# Patient Record
Sex: Male | Born: 1955 | Race: Black or African American | Hispanic: No | Marital: Single | Smoking: Former smoker
Health system: Southern US, Community
[De-identification: ages and names within clinical notes are randomized; demographics above are authoritative.]

## PROBLEM LIST (undated history)

## (undated) DIAGNOSIS — E785 Hyperlipidemia, unspecified: Secondary | ICD-10-CM

## (undated) DIAGNOSIS — Z8673 Personal history of transient ischemic attack (TIA), and cerebral infarction without residual deficits: Secondary | ICD-10-CM

## (undated) DIAGNOSIS — Z6825 Body mass index (BMI) 25.0-25.9, adult: Secondary | ICD-10-CM

## (undated) DIAGNOSIS — R251 Tremor, unspecified: Secondary | ICD-10-CM

## (undated) DIAGNOSIS — E559 Vitamin D deficiency, unspecified: Secondary | ICD-10-CM

## (undated) DIAGNOSIS — F1010 Alcohol abuse, uncomplicated: Secondary | ICD-10-CM

## (undated) DIAGNOSIS — I10 Essential (primary) hypertension: Secondary | ICD-10-CM

## (undated) DIAGNOSIS — R011 Cardiac murmur, unspecified: Secondary | ICD-10-CM

## (undated) DIAGNOSIS — N183 Chronic kidney disease, stage 3 unspecified: Secondary | ICD-10-CM

## (undated) DIAGNOSIS — I350 Nonrheumatic aortic (valve) stenosis: Secondary | ICD-10-CM

## (undated) DIAGNOSIS — E663 Overweight: Secondary | ICD-10-CM

## (undated) HISTORY — DX: Nonrheumatic aortic (valve) stenosis: I35.0

## (undated) HISTORY — DX: Vitamin D deficiency, unspecified: E55.9

## (undated) HISTORY — DX: Personal history of transient ischemic attack (TIA), and cerebral infarction without residual deficits: Z86.73

## (undated) HISTORY — DX: Alcohol abuse, uncomplicated: F10.10

## (undated) HISTORY — DX: Essential (primary) hypertension: I10

## (undated) HISTORY — DX: Tremor, unspecified: R25.1

## (undated) HISTORY — DX: Cardiac murmur, unspecified: R01.1

## (undated) HISTORY — DX: Chronic kidney disease, stage 3 unspecified: N18.30

## (undated) HISTORY — DX: Hyperlipidemia, unspecified: E78.5

## (undated) HISTORY — DX: Overweight: E66.3

## (undated) HISTORY — DX: Body mass index (BMI) 25.0-25.9, adult: Z68.25

---

## 2009-06-29 ENCOUNTER — Emergency Department (HOSPITAL_COMMUNITY): Admission: EM | Admit: 2009-06-29 | Discharge: 2009-06-29 | Payer: Self-pay | Admitting: Emergency Medicine

## 2009-07-02 ENCOUNTER — Emergency Department (HOSPITAL_COMMUNITY): Admission: EM | Admit: 2009-07-02 | Discharge: 2009-07-02 | Payer: Self-pay | Admitting: Family Medicine

## 2009-07-11 ENCOUNTER — Encounter: Admission: RE | Admit: 2009-07-11 | Discharge: 2009-07-11 | Payer: Self-pay | Admitting: Chiropractic Medicine

## 2017-01-16 ENCOUNTER — Emergency Department (HOSPITAL_COMMUNITY): Payer: Self-pay

## 2017-01-16 ENCOUNTER — Emergency Department (HOSPITAL_COMMUNITY)
Admission: EM | Admit: 2017-01-16 | Discharge: 2017-01-16 | Disposition: A | Discharge status: Home / Self Care | Payer: Self-pay | Attending: Emergency Medicine | Admitting: Emergency Medicine

## 2017-01-16 ENCOUNTER — Encounter (HOSPITAL_COMMUNITY): Payer: Self-pay | Admitting: Emergency Medicine

## 2017-01-16 DIAGNOSIS — M79641 Pain in right hand: Secondary | ICD-10-CM | POA: Insufficient documentation

## 2017-01-16 DIAGNOSIS — Y999 Unspecified external cause status: Secondary | ICD-10-CM | POA: Insufficient documentation

## 2017-01-16 DIAGNOSIS — F10920 Alcohol use, unspecified with intoxication, uncomplicated: Secondary | ICD-10-CM | POA: Insufficient documentation

## 2017-01-16 DIAGNOSIS — Y939 Activity, unspecified: Secondary | ICD-10-CM | POA: Insufficient documentation

## 2017-01-16 DIAGNOSIS — W19XXXA Unspecified fall, initial encounter: Secondary | ICD-10-CM

## 2017-01-16 DIAGNOSIS — W010XXA Fall on same level from slipping, tripping and stumbling without subsequent striking against object, initial encounter: Secondary | ICD-10-CM | POA: Insufficient documentation

## 2017-01-16 DIAGNOSIS — S60812A Abrasion of left wrist, initial encounter: Secondary | ICD-10-CM | POA: Insufficient documentation

## 2017-01-16 DIAGNOSIS — S80211A Abrasion, right knee, initial encounter: Secondary | ICD-10-CM | POA: Insufficient documentation

## 2017-01-16 DIAGNOSIS — Y929 Unspecified place or not applicable: Secondary | ICD-10-CM | POA: Insufficient documentation

## 2017-01-16 DIAGNOSIS — S060X0A Concussion without loss of consciousness, initial encounter: Secondary | ICD-10-CM | POA: Insufficient documentation

## 2017-01-16 HISTORY — DX: Alcohol abuse, uncomplicated: F10.10

## 2017-01-16 LAB — CBC WITH DIFFERENTIAL/PLATELET
BASOS ABS: 0 10*3/uL (ref 0.0–0.1)
BASOS PCT: 0 %
EOS ABS: 0 10*3/uL (ref 0.0–0.7)
EOS PCT: 0 %
HCT: 42.9 % (ref 39.0–52.0)
Hemoglobin: 15.1 g/dL (ref 13.0–17.0)
LYMPHS PCT: 45 %
Lymphs Abs: 3 10*3/uL (ref 0.7–4.0)
MCH: 33 pg (ref 26.0–34.0)
MCHC: 35.2 g/dL (ref 30.0–36.0)
MCV: 93.9 fL (ref 78.0–100.0)
Monocytes Absolute: 0.4 10*3/uL (ref 0.1–1.0)
Monocytes Relative: 6 %
Neutro Abs: 3.3 10*3/uL (ref 1.7–7.7)
Neutrophils Relative %: 49 %
PLATELETS: 158 10*3/uL (ref 150–400)
RBC: 4.57 MIL/uL (ref 4.22–5.81)
RDW: 13.3 % (ref 11.5–15.5)
WBC: 6.8 10*3/uL (ref 4.0–10.5)

## 2017-01-16 LAB — BASIC METABOLIC PANEL
Anion gap: 11 (ref 5–15)
BUN: 19 mg/dL (ref 6–20)
CHLORIDE: 103 mmol/L (ref 101–111)
CO2: 22 mmol/L (ref 22–32)
CREATININE: 1.62 mg/dL — AB (ref 0.61–1.24)
Calcium: 9.4 mg/dL (ref 8.9–10.3)
GFR, EST AFRICAN AMERICAN: 52 mL/min — AB (ref >60)
GFR, EST NON AFRICAN AMERICAN: 45 mL/min — AB (ref >60)
Glucose, Bld: 91 mg/dL (ref 65–99)
Potassium: 3.6 mmol/L (ref 3.5–5.1)
SODIUM: 136 mmol/L (ref 135–145)

## 2017-01-16 LAB — ETHANOL: ALCOHOL ETHYL (B): 381 mg/dL — AB (ref <5)

## 2017-01-16 MED ORDER — SODIUM CHLORIDE 0.9 % IV BOLUS (SEPSIS)
1000.0000 mL | Freq: Once | INTRAVENOUS | Status: AC
  Administered 2017-01-16: 1000 mL via INTRAVENOUS

## 2017-01-16 MED ORDER — BACITRACIN ZINC 500 UNIT/GM EX OINT: 1.0000 "application " | TOPICAL_OINTMENT | Freq: Two times a day (BID) | CUTANEOUS | 1 refills | Status: DC

## 2017-01-16 NOTE — ED Provider Notes (Signed)
[facility]-EMERGENCY DEPT Provider Note   CSN: [id number] Arrival date & time: 01/16/17  1732     History   Chief Complaint Chief Complaint  Patient presents with  . Alcohol Intoxication    HPI Steve Kelly is a 61 y.o. male.   Level V caveat: Alcohol intoxication.   Steve Kelly is a 61 y.o. Male with a history of alcoholism who presents to the ED via EMS after a fall while drinking today. Patient admits to drinking lots of alcohol today. He tells me he likes "the cheap stuff."  He can provide no details about his fall today. EMS he fell onto a table while drinking today. Initially, patient denies any complaints. During exam he does report he has pain to his right hand, and his right knee. His is unsure if he lost consciousness. He denies head or neck pain. No reported LOC per bystanders.  He denies chest pain or trouble breathing.    The history is provided by the patient, the EMS personnel and medical records. No language interpreter was used.  Alcohol Intoxication  Pertinent negatives include no chest pain, no abdominal pain, no headaches and no shortness of breath.    Past Medical History:  Diagnosis Date  . Alcohol abuse     There are no active problems to display for this patient.   History reviewed. No pertinent surgical history.     Home Medications    Prior to Admission medications   Medication Sig Start Date End Date Taking? Authorizing Provider  acetaminophen (TYLENOL) 325 MG tablet Take 650 mg by mouth every 6 (six) hours as needed for mild pain.   Yes [provider]  naproxen sodium (ANAPROX) 220 MG tablet Take 220 mg by mouth 2 (two) times daily as needed (for mild pain).    Yes [provider]  bacitracin ointment Apply 1 application topically 2 (two) times daily. 01/16/17   Everlene Farrieransie, Rohith Fauth, PA-C    Family History History reviewed. No pertinent family history.  Social History Social History  Substance Use Topics  . Smoking  status: Never Smoker  . Smokeless tobacco: Never Used  . Alcohol use Yes     Allergies   Patient has no known allergies.   Review of Systems Review of Systems  Unable to perform ROS: Mental status change (alcohol intoxication )  Constitutional: Negative for fever.  HENT: Negative for nosebleeds.   Eyes: Negative for visual disturbance.  Respiratory: Negative for cough and shortness of breath.   Cardiovascular: Negative for chest pain.  Gastrointestinal: Negative for abdominal pain, nausea and vomiting.  Musculoskeletal: Positive for arthralgias. Negative for back pain and neck pain.  Skin: Positive for wound. Negative for rash.  Neurological: Negative for headaches.     Physical Exam Updated Vital Signs BP 113/61   Pulse 89   Temp 98.4 F (36.9 C) (Oral)   Resp 14   SpO2 92%   Physical Exam  Constitutional: He appears well-developed and well-nourished. No distress.  HENT:  Head: Normocephalic.  Right Ear: External ear normal.  Left Ear: External ear normal.  Mouth/Throat: Oropharynx is clear and moist.  Abrasion to right forehead. No other visible or palpated signs of head injury or trauma. Patient pulled out dentures and threw them on the floor.   Eyes: Pupils are equal, round, and reactive to light. Conjunctivae and EOM are normal. Right eye exhibits no discharge. Left eye exhibits no discharge.  Neck: Neck supple. No tracheal deviation present.  Wearing  c-collar.   Cardiovascular: Normal rate, regular rhythm, normal heart sounds and intact distal pulses.  Exam reveals no gallop and no friction rub.   No murmur heard. Bilateral radial, posterior tibialis and dorsalis pedis pulses are intact.    Pulmonary/Chest: Effort normal and breath sounds normal. No respiratory distress. He has no wheezes. He has no rales. He exhibits no tenderness.  Lungs are clear to ascultation bilaterally. Symmetric chest expansion bilaterally. No increased work of breathing. No rales or  rhonchi.    Abdominal: Soft. There is no tenderness. There is no guarding.  Musculoskeletal: Normal range of motion. He exhibits tenderness. He exhibits no edema or deformity.  Abrasion to left wrist. Pain with ROM of right hand without deformity. Superficial abrasion to right knee. Good ROM of bilateral knees without deformity. No midline back tenderness to palpation. No clavicle tenderness bilateral. Patient's bilateral shoulder, elbow, hip and ankle joints are supple and non-tender to palpation.  Neurological: He is alert. No cranial nerve deficit or sensory deficit. Coordination normal.  Oriented to person and place. Amnesia to the event. Strength and sensation is intact to his bilateral upper and lower extremities.  Skin: Skin is warm and dry. Capillary refill takes less than 2 seconds. He is not diaphoretic. No erythema. No pallor.  Psychiatric: He has a normal mood and affect. His behavior is normal.  Nursing note and vitals reviewed.    ED Treatments / Results  Labs (all labs ordered are listed, but only abnormal results are displayed) Labs Reviewed  BASIC METABOLIC PANEL - Abnormal; Notable for the following:       Result Value   Creatinine, Ser 1.62 (*)    GFR calc non Af Amer 45 (*)    GFR calc Af Amer 52 (*)    All other components within normal limits  ETHANOL - Abnormal; Notable for the following:    Alcohol, Ethyl (B) 381 (*)    All other components within normal limits  CBC WITH DIFFERENTIAL/PLATELET    EKG  EKG Interpretation None       Radiology Dg Wrist Complete Left  Result Date: 01/16/2017 CLINICAL DATA:  Pain and abrasions posterior LEFT wrist, fall EXAM: LEFT WRIST - COMPLETE 3+ VIEW COMPARISON:  None FINDINGS: Degenerative changes at first [facility] joint. Remain joint spaces preserved. Osseous mineralization grossly normal. No acute fracture, dislocation, or bone destruction. IMPRESSION: No acute osseous abnormalities. Degenerative changes at first [facility] joint.  Electronically Signed   By: Ulyses Southward M.D.   On: 01/16/2017 19:33   Ct Head Wo Contrast  Result Date: 01/16/2017 CLINICAL DATA:  Patient status post fall.  Forehead contusion. EXAM: CT HEAD WITHOUT CONTRAST CT CERVICAL SPINE WITHOUT CONTRAST TECHNIQUE: Multidetector CT imaging of the head and cervical spine was performed following the standard protocol without intravenous contrast. Multiplanar CT image reconstructions of the cervical spine were also generated. COMPARISON:  Cervical spine radiograph 07/11/2009 FINDINGS: CT HEAD FINDINGS Brain: Ventricles and sulci are prominent compatible with atrophy. Periventricular and subcortical white matter hypodensity compatible with chronic microvascular ischemic changes. No evidence for acute cortically based infarct, intracranial hemorrhage, mass lesion or mass-effect. Vascular: Unremarkable. Skull: Intact. Sinuses/Orbits: Paranasal sinuses are well aerated. Mastoid air cells are unremarkable. Orbits are unremarkable. Other: Soft tissue swelling overlying the left frontal calvarium. CT CERVICAL SPINE FINDINGS Alignment: Straightening of the normal cervical lordosis. Skull base and vertebrae: Intact. Soft tissues and spinal canal: No prevertebral fluid or swelling. No visible canal hematoma. Disc levels: Multilevel degenerative disc disease  throughout the entire cervical spine with anterior and posterior osteophytosis. Mild facet degenerative changes. Upper chest: Lung apices are clear. Other: None. IMPRESSION: No acute intracranial process. No acute cervical spine fracture. Atrophy and chronic microvascular ischemic changes. Multilevel degenerative disc disease. Electronically Signed   By: Annia Belt M.D.   On: 01/16/2017 19:46   Ct Cervical Spine Wo Contrast  Result Date: 01/16/2017 CLINICAL DATA:  Patient status post fall.  Forehead contusion. EXAM: CT HEAD WITHOUT CONTRAST CT CERVICAL SPINE WITHOUT CONTRAST TECHNIQUE: Multidetector CT imaging of the head and  cervical spine was performed following the standard protocol without intravenous contrast. Multiplanar CT image reconstructions of the cervical spine were also generated. COMPARISON:  Cervical spine radiograph 07/11/2009 FINDINGS: CT HEAD FINDINGS Brain: Ventricles and sulci are prominent compatible with atrophy. Periventricular and subcortical white matter hypodensity compatible with chronic microvascular ischemic changes. No evidence for acute cortically based infarct, intracranial hemorrhage, mass lesion or mass-effect. Vascular: Unremarkable. Skull: Intact. Sinuses/Orbits: Paranasal sinuses are well aerated. Mastoid air cells are unremarkable. Orbits are unremarkable. Other: Soft tissue swelling overlying the left frontal calvarium. CT CERVICAL SPINE FINDINGS Alignment: Straightening of the normal cervical lordosis. Skull base and vertebrae: Intact. Soft tissues and spinal canal: No prevertebral fluid or swelling. No visible canal hematoma. Disc levels: Multilevel degenerative disc disease throughout the entire cervical spine with anterior and posterior osteophytosis. Mild facet degenerative changes. Upper chest: Lung apices are clear. Other: None. IMPRESSION: No acute intracranial process. No acute cervical spine fracture. Atrophy and chronic microvascular ischemic changes. Multilevel degenerative disc disease. Electronically Signed   By: Annia Belt M.D.   On: 01/16/2017 19:46   Dg Knee Complete 4 Views Right  Result Date: 01/16/2017 CLINICAL DATA:  Pain and abrasions RIGHT knee, fall EXAM: RIGHT KNEE - COMPLETE 4+ VIEW COMPARISON:  None FINDINGS: Mild medial compartment joint space narrowing. Osseous mineralization normal. No acute fracture, dislocation, or bone destruction. No knee joint effusion. IMPRESSION: No acute osseous abnormalities. Mild degenerative changes RIGHT knee. Electronically Signed   By: Ulyses Southward M.D.   On: 01/16/2017 19:32   Dg Hand Complete Right  Result Date: 01/16/2017 CLINICAL  DATA:  Posterior RIGHT hand pain, fall EXAM: RIGHT HAND - COMPLETE 3+ VIEW COMPARISON:  None FINDINGS: Osseous mineralization normal. Joint spaces preserved. Minimal degenerative changes at first [facility] joint. Motion artifacts degrade lateral view. No acute fracture, dislocation, or bone destruction. IMPRESSION: No definite acute bony abnormalities. Electronically Signed   By: Ulyses Southward M.D.   On: 01/16/2017 19:31    Procedures Procedures (including critical care time)  Medications Ordered in ED Medications  sodium chloride 0.9 % bolus 1,000 mL (0 mLs Intravenous Stopped 01/16/17 2104)     Initial Impression / Assessment and Plan / ED Course  I have reviewed the triage vital signs and the nursing notes.  Pertinent labs & imaging results that were available during my care of the patient were reviewed by me and considered in my medical decision making (see chart for details).    This is a 61 y.o. Male with a history of alcoholism who presents to the ED via EMS after a fall while drinking today. Patient admits to drinking lots of alcohol today. He tells me he likes "the cheap stuff."  He can provide no details about his fall today. EMS he fell onto a table while drinking today. Initially, patient denies any complaints. During exam he does report he has pain to his right hand, and his right knee. His  is unsure if he lost consciousness. He denies head or neck pain. No reported LOC per bystanders.  Exam patient is afebrile nontoxic appearing. He smells of alcohol.He has a small abrasion noted to his right forehead. There is also an abrasion noted to his left wrist. He also has a abrasion to his right knee. He is moving all of his extremities without difficulty. No bony point tenderness or deformity. Ethanol level is 381. CT scans were obtained of his head and cervical spine. No acute findings noted. He also had x-rays of his right hand, left wrist, and right knee. These are also unremarkable. Patient was  observed for several hours on the emergency department. He ambulated without difficulty. He is tolerating by mouth. At reevaluation patient denies any complaints. He reports he feels fine and is ready to go home. He'll be discharged to the care of his family. Head injury precautions discussed. Wound care instructions provided. I advised the patient to follow-up with their primary care provider this week. I advised the patient to return to the emergency department with new or worsening symptoms or new concerns. The patient verbalized understanding and agreement with plan.      Final Clinical Impressions(s) / ED Diagnoses   Final diagnoses:  Alcoholic intoxication without complication (HCC)  Fall, initial encounter  Concussion without loss of consciousness, initial encounter  Abrasion of left wrist, initial encounter  Right hand pain  Abrasion of right knee, initial encounter    New Prescriptions Discharge Medication List as of 01/16/2017 11:08 PM    START taking these medications   Details  bacitracin ointment Apply 1 application topically 2 (two) times daily., Starting Sat 01/16/2017, Print         Everlene Farrier, PA-C 01/17/17 [place]    Loren Racer, MD 01/17/17 [phone]

## 2017-01-16 NOTE — ED Notes (Signed)
Pt understood dc material. NAD noted. Scripts given at [facility]. Spoke with [place] and [place] who the patient lives with. Concerned about his discharge. Provider aware and family aware of patients condition. Social work consult was placed to help with community resources.

## 2017-01-16 NOTE — ED Notes (Signed)
Pt Ambulated in hallway to Pod B Bathroom. Pt needed no assistance walking

## 2017-01-16 NOTE — ED Notes (Signed)
Patient transported to X-ray 

## 2017-01-16 NOTE — ED Triage Notes (Signed)
Per EMS: pt from home after drinking large amounts of ETOH per norm; pt fell into table on neighbors front porch; redness noted to chest, abrasion and contusion to forehead; abrasion to left hand; pt mae; IV 18g R FA

## 2017-01-16 NOTE — ED Notes (Signed)
C Collar removed per provider 

## 2018-01-13 IMAGING — DX DG WRIST COMPLETE 3+V*L*
4 series · 4 of 4 positions shown · non-contrast
Comparison: None

CLINICAL DATA: Pain and abrasions posterior LEFT wrist, fall

EXAM:
LEFT WRIST - COMPLETE 3+ VIEW

[wrist pa]
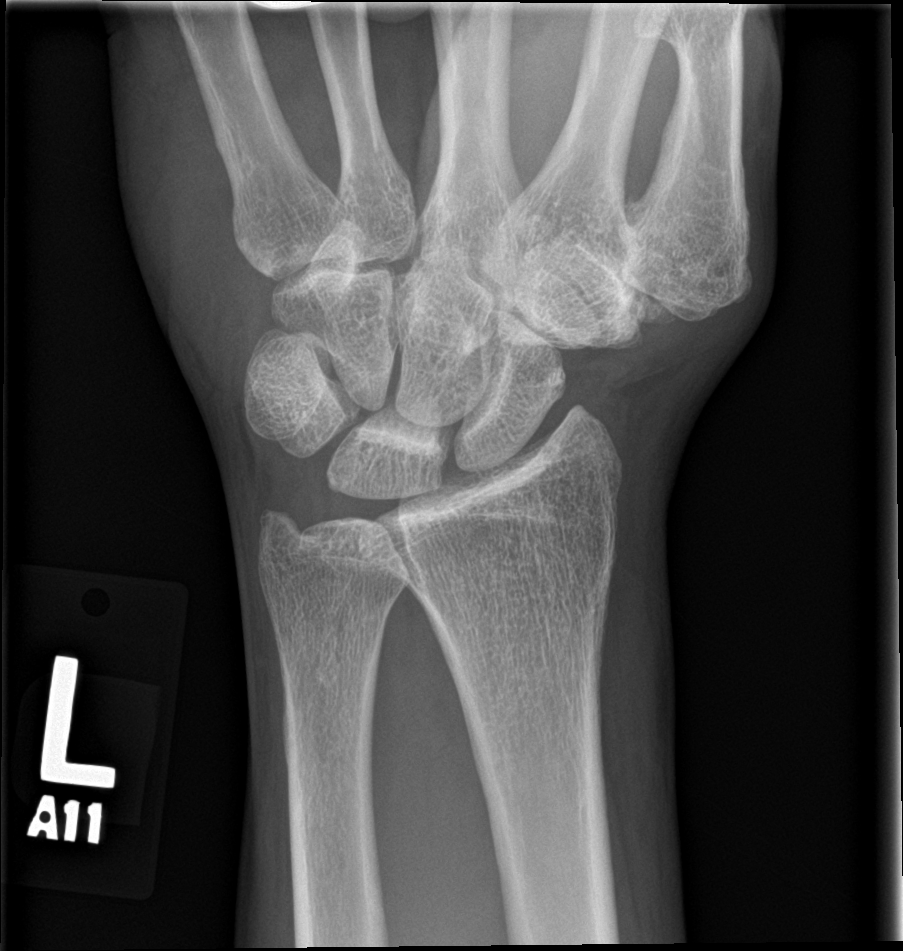

[wrist obl]
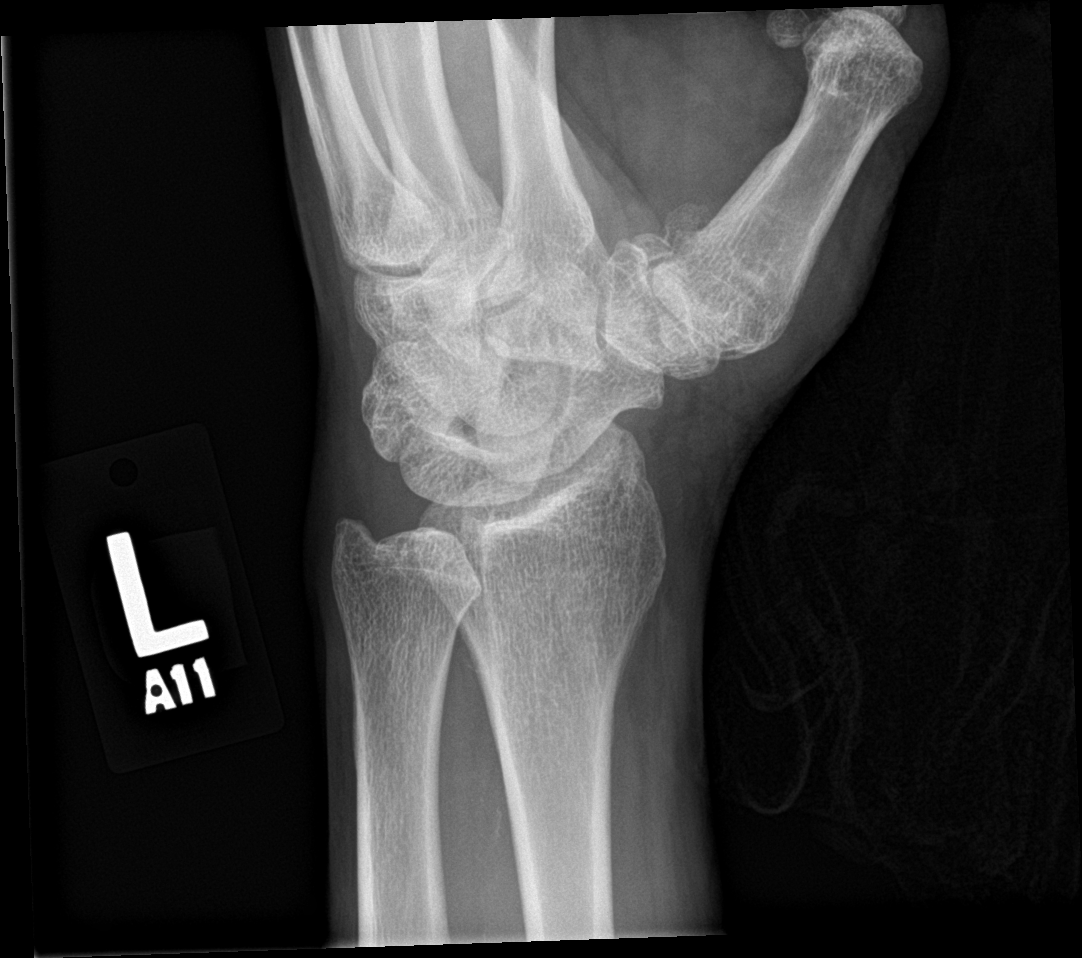

[wrist lat]
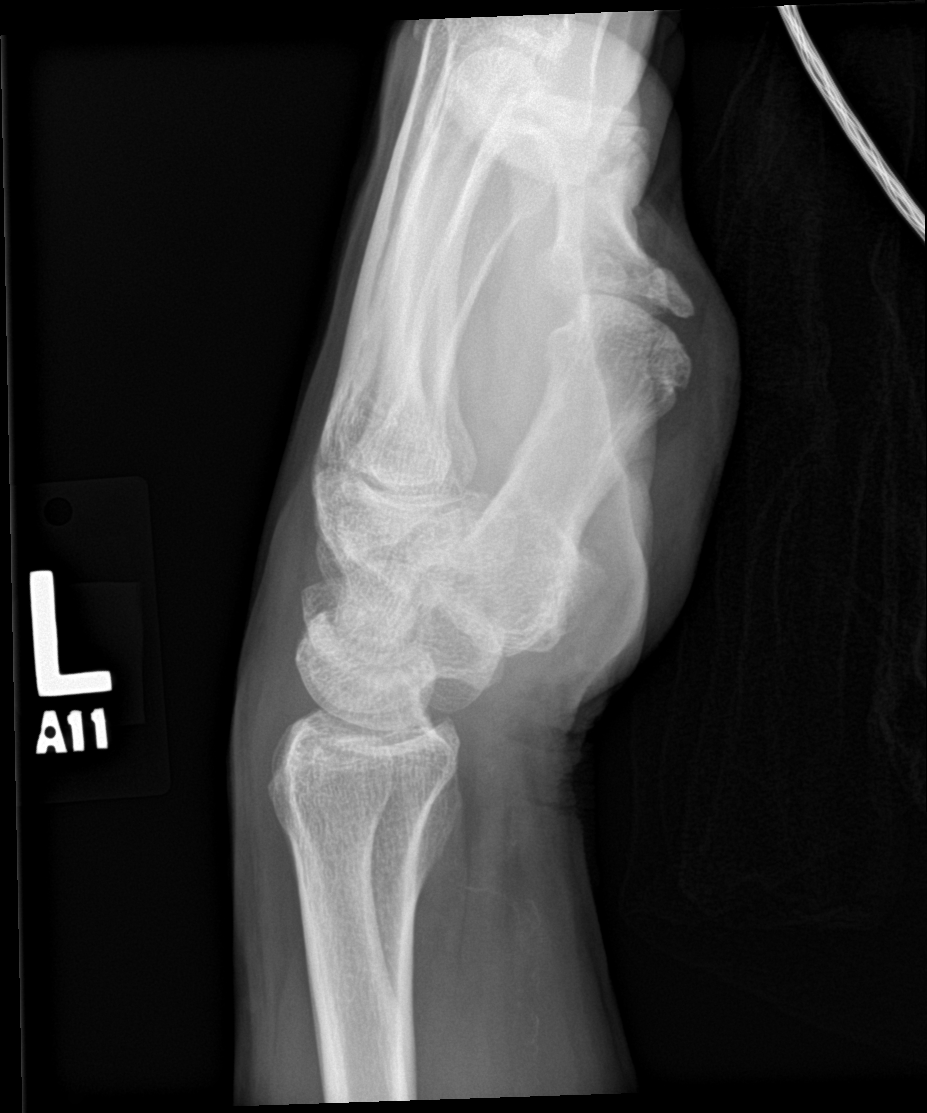

[wrist navicular]
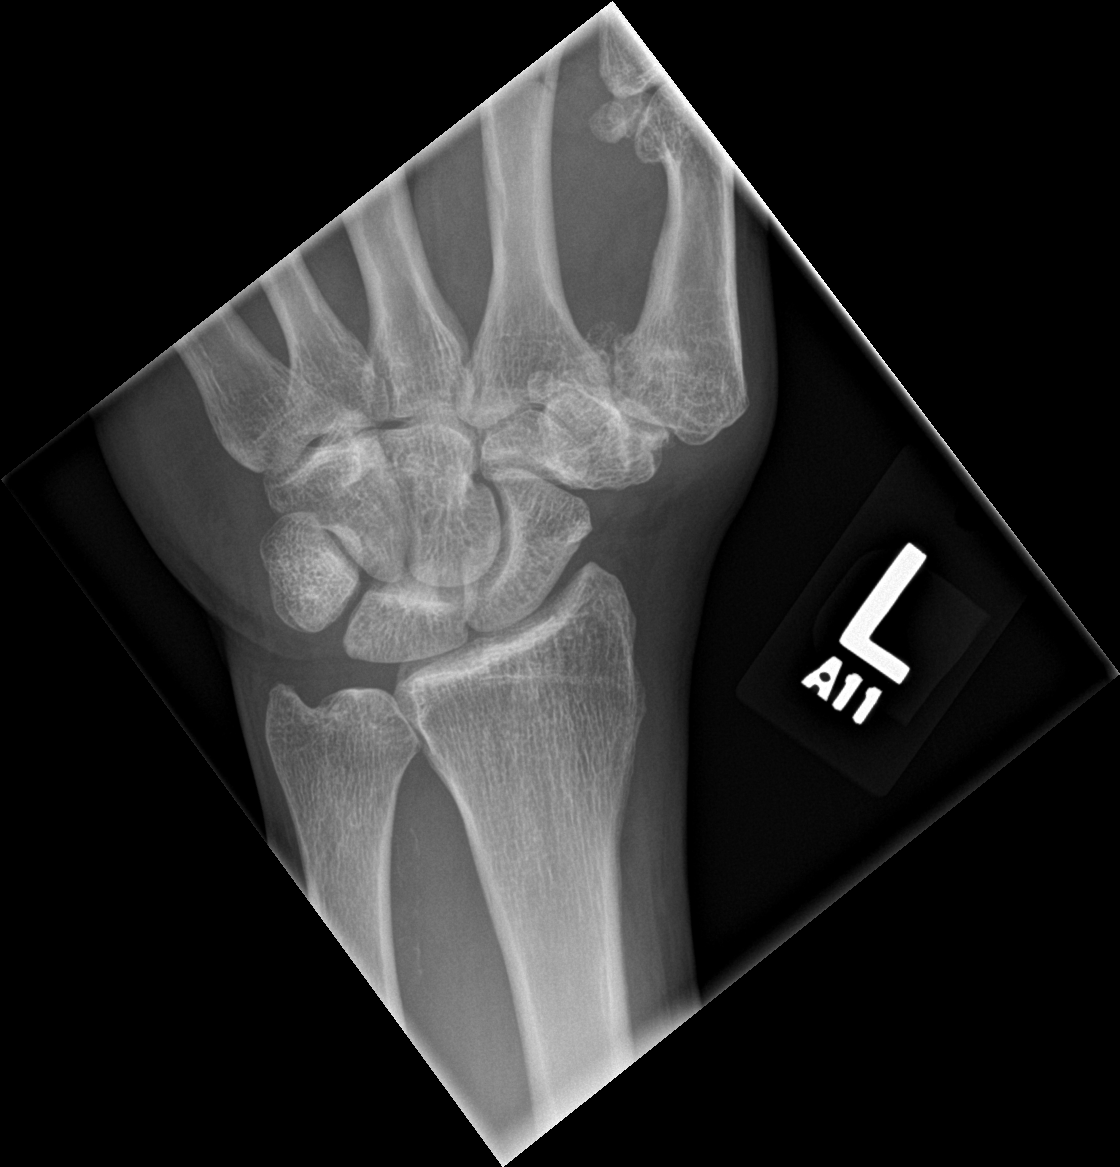

[4 of 4 positions shown; findings below may reference images not displayed]

FINDINGS: Degenerative changes at first [facility] joint.

Remain joint spaces preserved.

Osseous mineralization grossly normal.

No acute fracture, dislocation, or bone destruction.
IMPRESSION: No acute osseous abnormalities.

Degenerative changes at first [facility] joint.

## 2018-01-13 IMAGING — CT CT CERVICAL SPINE W/O CM
4 of 7 series · 16 of 33 positions shown, 17 images · non-contrast
Comparison: Cervical spine radiograph 07/11/2009

CLINICAL DATA: Patient status post fall.  Forehead contusion.

EXAM:
CT HEAD WITHOUT CONTRAST
CT CERVICAL SPINE WITHOUT CONTRAST
TECHNIQUE: Multidetector CT imaging of the head and cervical spine was
performed following the standard protocol without intravenous
contrast. Multiplanar CT image reconstructions of the cervical spine
were also generated.

[Series 6: head 3.0 mpr cor · coronal · 0.29mm/px · 3 of 71 slices shown]
[im 18/71  bone]
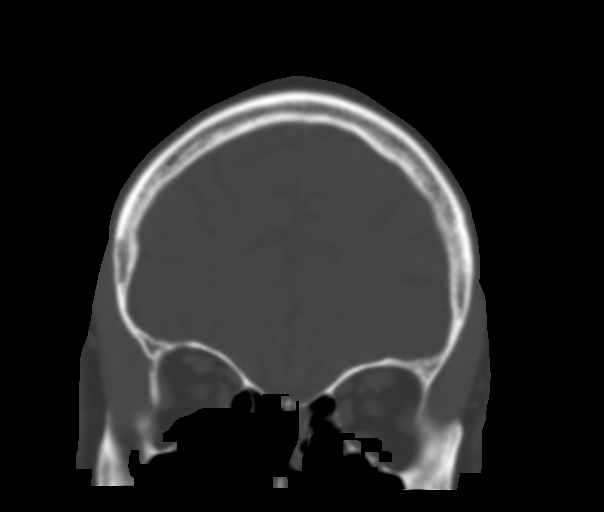
[im 36/71  bone]
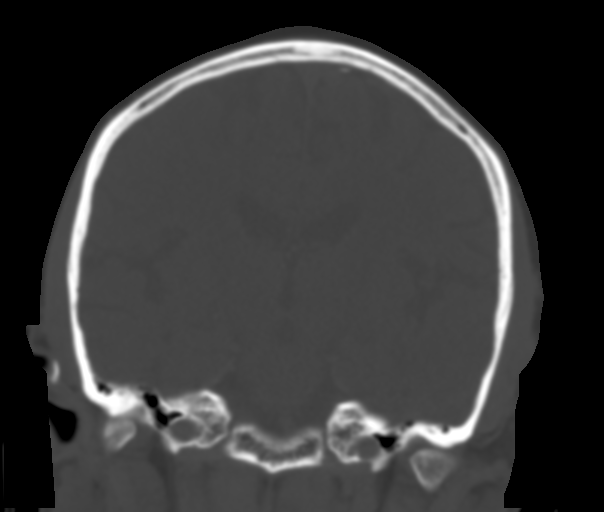
[im 53/71  bone]
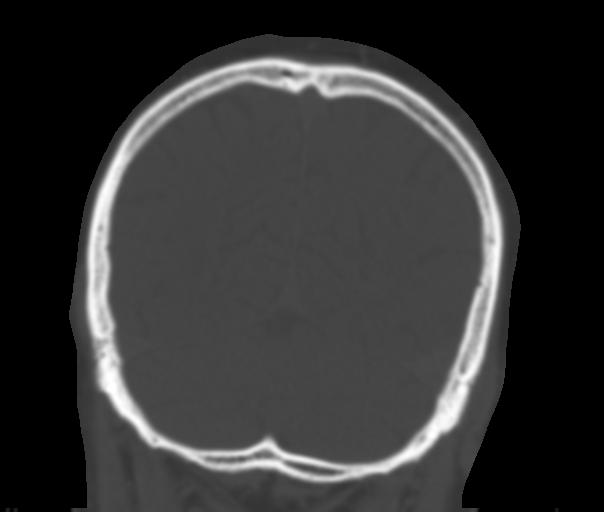

[Series 9: c_spine 2.0 st · axial · 0.27mm/px · z∈[-203,-103]mm · 4 of 84 slices shown, 5 images]
[im 17/84  soft-tissue]
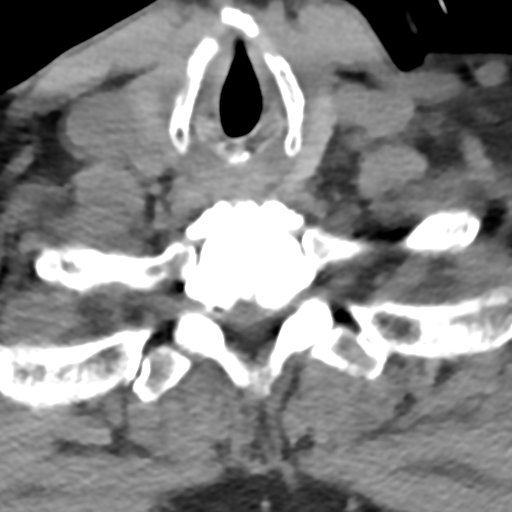
[im 17/84  bone]
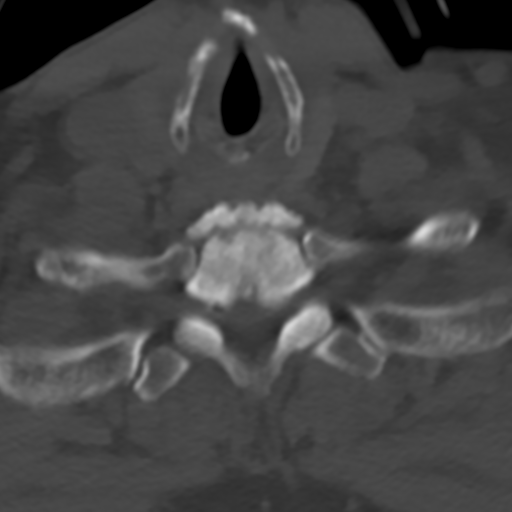
[im 34/84  bone]
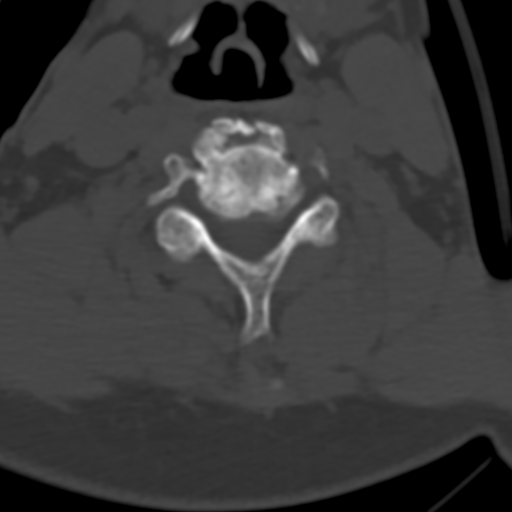
[im 50/84  bone]
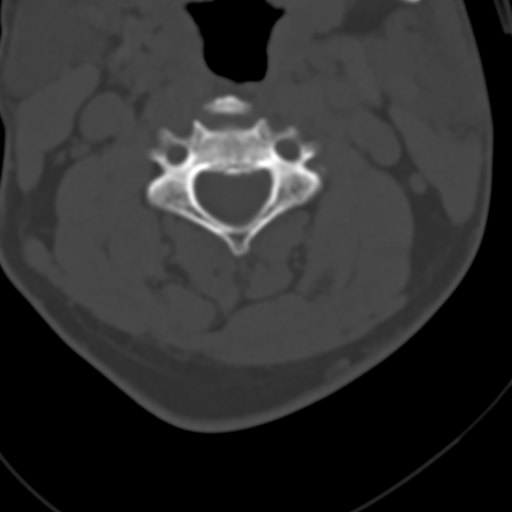
[im 67/84  bone]
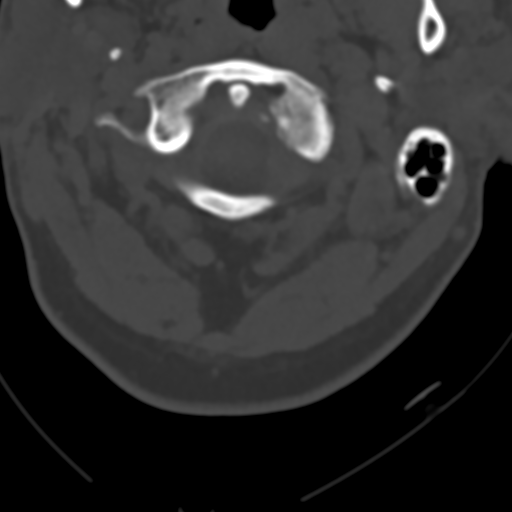

[Series 11: sagittal bone · sagittal · 0.28mm/px · 5 of 61 slices shown]
[im 11/61  bone]
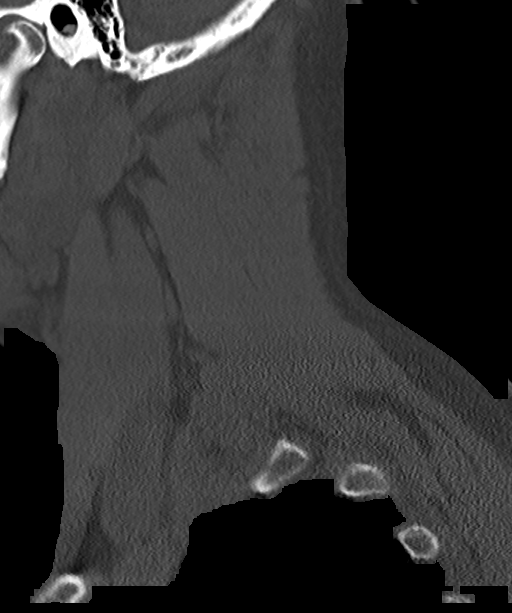
[im 21/61  bone]
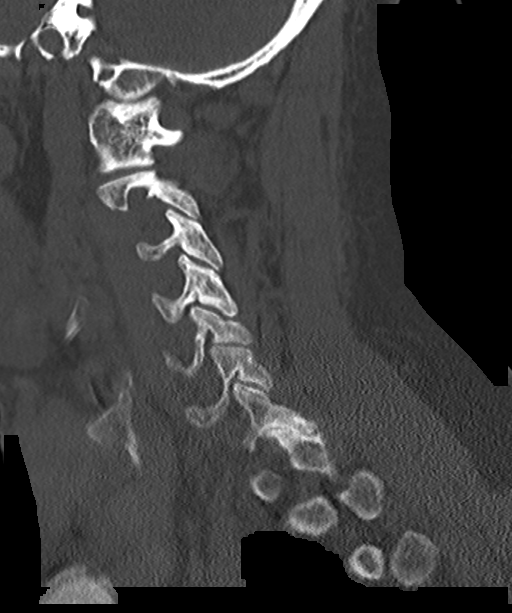
[im 31/61  bone]
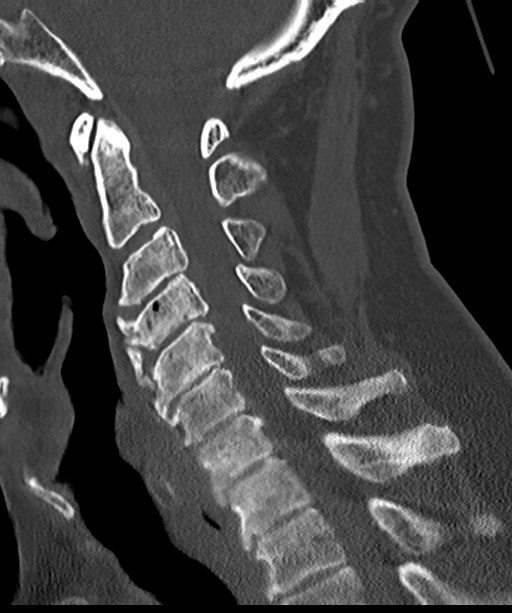
[im 41/61  bone]
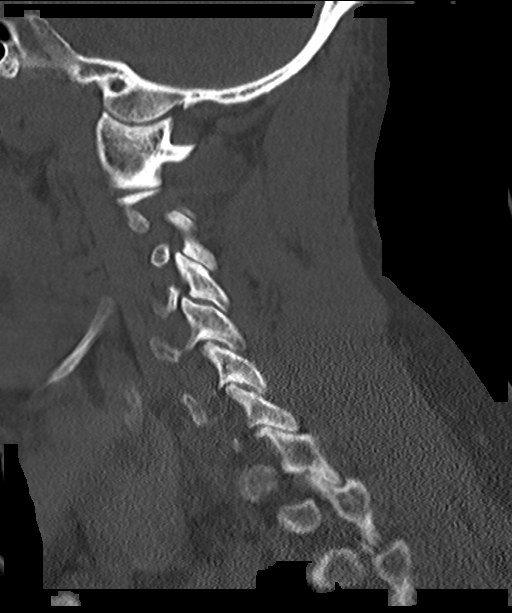
[im 51/61  bone]
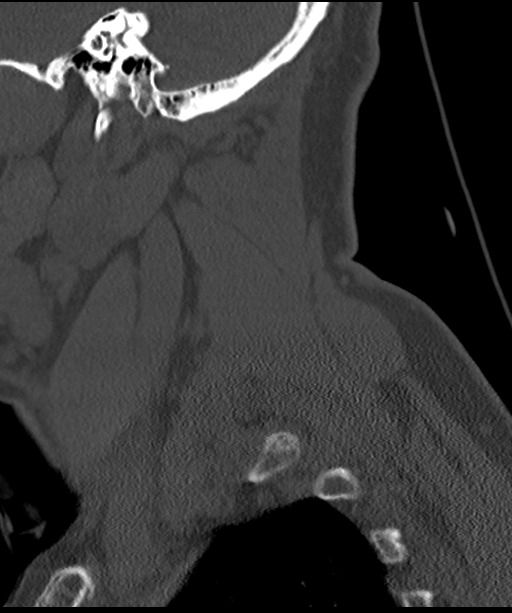

[Series 13: orthogonal axial st · axial · 0.21mm/px · z∈[-229,-129]mm · 4 of 88 slices shown]
[im 18/88  bone]
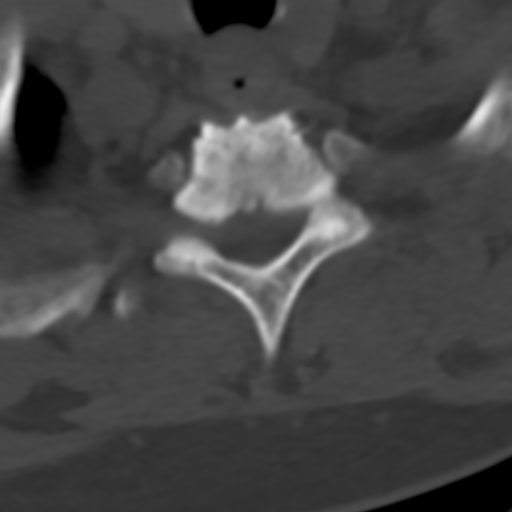
[im 35/88  bone]
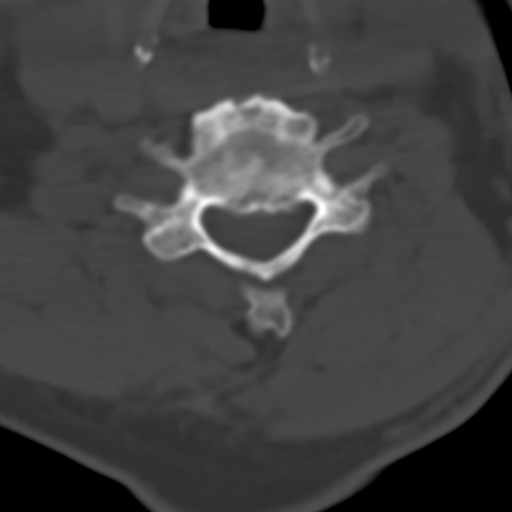
[im 53/88  bone]
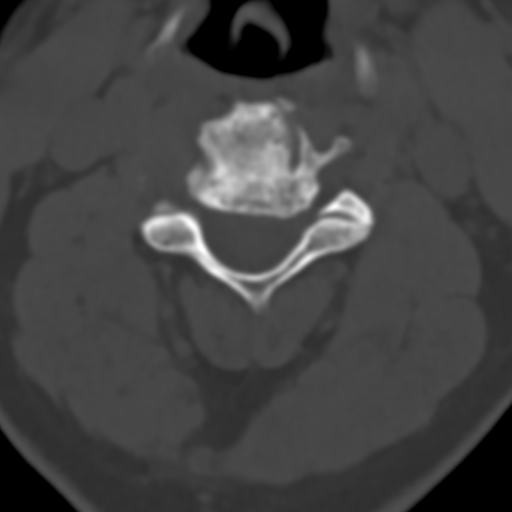
[im 70/88  bone]
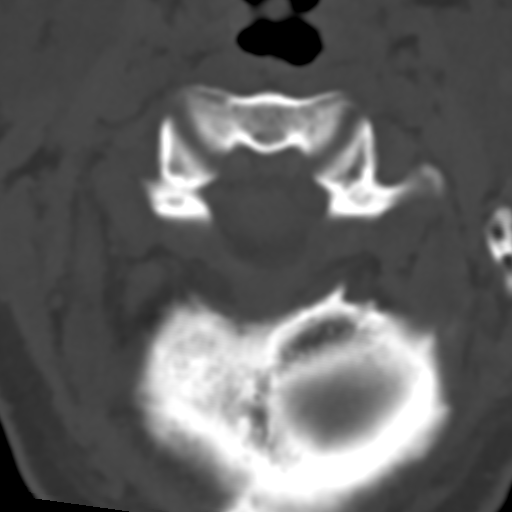

[16 of 33 positions shown; findings below may reference images not displayed]

FINDINGS: CT HEAD FINDINGS

Brain: Ventricles and sulci are prominent compatible with atrophy.
Periventricular and subcortical white matter hypodensity compatible
with chronic microvascular ischemic changes. No evidence for acute
cortically based infarct, intracranial hemorrhage, mass lesion or
mass-effect.

Vascular: Unremarkable.

Skull: Intact.

Sinuses/Orbits: Paranasal sinuses are well aerated. Mastoid air
cells are unremarkable. Orbits are unremarkable.

Other: Soft tissue swelling overlying the left frontal calvarium.

CT CERVICAL SPINE FINDINGS

Alignment: Straightening of the normal cervical lordosis.

Skull base and vertebrae: Intact.

Soft tissues and spinal canal: No prevertebral fluid or swelling. No
visible canal hematoma.

Disc levels: Multilevel degenerative disc disease throughout the
entire cervical spine with anterior and posterior osteophytosis.
Mild facet degenerative changes.

Upper chest: Lung apices are clear.

Other: None.
IMPRESSION: No acute intracranial process.

No acute cervical spine fracture.

Atrophy and chronic microvascular ischemic changes.

Multilevel degenerative disc disease.

## 2022-10-09 ENCOUNTER — Other Ambulatory Visit: Payer: Self-pay

## 2022-10-09 DIAGNOSIS — R011 Cardiac murmur, unspecified: Secondary | ICD-10-CM | POA: Insufficient documentation

## 2022-10-09 DIAGNOSIS — I10 Essential (primary) hypertension: Secondary | ICD-10-CM | POA: Insufficient documentation

## 2022-10-09 DIAGNOSIS — E559 Vitamin D deficiency, unspecified: Secondary | ICD-10-CM | POA: Insufficient documentation

## 2022-10-09 DIAGNOSIS — R251 Tremor, unspecified: Secondary | ICD-10-CM | POA: Insufficient documentation

## 2022-10-09 DIAGNOSIS — F1010 Alcohol abuse, uncomplicated: Secondary | ICD-10-CM | POA: Insufficient documentation

## 2022-10-09 HISTORY — DX: Cardiac murmur, unspecified: R01.1

## 2022-11-19 ENCOUNTER — Ambulatory Visit: Payer: Medicare Other | Attending: Cardiology | Admitting: Cardiology

## 2022-11-27 ENCOUNTER — Encounter: Payer: Self-pay | Admitting: General Practice

## 2023-03-14 ENCOUNTER — Other Ambulatory Visit: Payer: Self-pay

## 2023-03-14 ENCOUNTER — Inpatient Hospital Stay (HOSPITAL_COMMUNITY)
Admission: EM | Admit: 2023-03-14 | Discharge: 2023-03-19 | DRG: 896 | Disposition: A | Discharge status: Skilled Nursing Facility | Payer: Medicare Other | Attending: Internal Medicine | Admitting: Internal Medicine

## 2023-03-14 ENCOUNTER — Emergency Department (HOSPITAL_COMMUNITY): Payer: Medicare Other

## 2023-03-14 ENCOUNTER — Encounter (HOSPITAL_COMMUNITY): Payer: Self-pay | Admitting: Emergency Medicine

## 2023-03-14 ENCOUNTER — Inpatient Hospital Stay (HOSPITAL_COMMUNITY): Payer: Medicare Other

## 2023-03-14 DIAGNOSIS — F10939 Alcohol use, unspecified with withdrawal, unspecified: Secondary | ICD-10-CM

## 2023-03-14 DIAGNOSIS — F10231 Alcohol dependence with withdrawal delirium: Principal | ICD-10-CM | POA: Diagnosis present

## 2023-03-14 DIAGNOSIS — R1310 Dysphagia, unspecified: Secondary | ICD-10-CM | POA: Diagnosis not present

## 2023-03-14 DIAGNOSIS — E876 Hypokalemia: Secondary | ICD-10-CM

## 2023-03-14 DIAGNOSIS — G928 Other toxic encephalopathy: Secondary | ICD-10-CM | POA: Diagnosis present

## 2023-03-14 DIAGNOSIS — I10 Essential (primary) hypertension: Secondary | ICD-10-CM | POA: Diagnosis present

## 2023-03-14 DIAGNOSIS — Z83438 Family history of other disorder of lipoprotein metabolism and other lipidemia: Secondary | ICD-10-CM

## 2023-03-14 DIAGNOSIS — Z8249 Family history of ischemic heart disease and other diseases of the circulatory system: Secondary | ICD-10-CM

## 2023-03-14 DIAGNOSIS — E860 Dehydration: Secondary | ICD-10-CM | POA: Diagnosis present

## 2023-03-14 DIAGNOSIS — R531 Weakness: Secondary | ICD-10-CM

## 2023-03-14 DIAGNOSIS — K760 Fatty (change of) liver, not elsewhere classified: Secondary | ICD-10-CM | POA: Diagnosis present

## 2023-03-14 DIAGNOSIS — Z9181 History of falling: Secondary | ICD-10-CM

## 2023-03-14 DIAGNOSIS — G9341 Metabolic encephalopathy: Secondary | ICD-10-CM | POA: Diagnosis present

## 2023-03-14 DIAGNOSIS — R030 Elevated blood-pressure reading, without diagnosis of hypertension: Secondary | ICD-10-CM

## 2023-03-14 DIAGNOSIS — F10931 Alcohol use, unspecified with withdrawal delirium: Secondary | ICD-10-CM

## 2023-03-14 DIAGNOSIS — M542 Cervicalgia: Secondary | ICD-10-CM | POA: Diagnosis present

## 2023-03-14 DIAGNOSIS — Z7982 Long term (current) use of aspirin: Secondary | ICD-10-CM

## 2023-03-14 DIAGNOSIS — Z79899 Other long term (current) drug therapy: Secondary | ICD-10-CM

## 2023-03-14 DIAGNOSIS — Z87898 Personal history of other specified conditions: Secondary | ICD-10-CM

## 2023-03-14 DIAGNOSIS — R4182 Altered mental status, unspecified: Secondary | ICD-10-CM

## 2023-03-14 DIAGNOSIS — D6959 Other secondary thrombocytopenia: Secondary | ICD-10-CM | POA: Diagnosis present

## 2023-03-14 DIAGNOSIS — Z833 Family history of diabetes mellitus: Secondary | ICD-10-CM

## 2023-03-14 DIAGNOSIS — Z87891 Personal history of nicotine dependence: Secondary | ICD-10-CM | POA: Diagnosis not present

## 2023-03-14 DIAGNOSIS — R296 Repeated falls: Secondary | ICD-10-CM | POA: Diagnosis present

## 2023-03-14 DIAGNOSIS — D696 Thrombocytopenia, unspecified: Secondary | ICD-10-CM

## 2023-03-14 DIAGNOSIS — R410 Disorientation, unspecified: Secondary | ICD-10-CM

## 2023-03-14 DIAGNOSIS — M25562 Pain in left knee: Secondary | ICD-10-CM | POA: Diagnosis present

## 2023-03-14 DIAGNOSIS — T510X4A Toxic effect of ethanol, undetermined, initial encounter: Secondary | ICD-10-CM | POA: Diagnosis present

## 2023-03-14 DIAGNOSIS — M6282 Rhabdomyolysis: Secondary | ICD-10-CM | POA: Diagnosis present

## 2023-03-14 DIAGNOSIS — R569 Unspecified convulsions: Secondary | ICD-10-CM | POA: Diagnosis not present

## 2023-03-14 HISTORY — DX: Weakness: R53.1

## 2023-03-14 HISTORY — DX: Personal history of other specified conditions: Z87.898

## 2023-03-14 HISTORY — DX: Thrombocytopenia, unspecified: D69.6

## 2023-03-14 HISTORY — DX: Alcohol use, unspecified with withdrawal, unspecified: F10.939

## 2023-03-14 HISTORY — DX: Altered mental status, unspecified: R41.82

## 2023-03-14 HISTORY — DX: Hypokalemia: E87.6

## 2023-03-14 LAB — CBC
HCT: 35.4 % — ABNORMAL LOW (ref 39.0–52.0)
Hemoglobin: 12.1 g/dL — ABNORMAL LOW (ref 13.0–17.0)
MCH: 34.1 pg — ABNORMAL HIGH (ref 26.0–34.0)
MCHC: 34.2 g/dL (ref 30.0–36.0)
MCV: 99.7 fL (ref 80.0–100.0)
Platelets: 100 10*3/uL — ABNORMAL LOW (ref 150–400)
RBC: 3.55 MIL/uL — ABNORMAL LOW (ref 4.22–5.81)
RDW: 13.1 % (ref 11.5–15.5)
WBC: 4.5 10*3/uL (ref 4.0–10.5)
nRBC: 0 % (ref 0.0–0.2)

## 2023-03-14 LAB — COMPREHENSIVE METABOLIC PANEL
ALT: 28 U/L (ref 0–44)
AST: 67 U/L — ABNORMAL HIGH (ref 15–41)
Albumin: 4 g/dL (ref 3.5–5.0)
Alkaline Phosphatase: 40 U/L (ref 38–126)
Anion gap: 24 — ABNORMAL HIGH (ref 5–15)
BUN: 23 mg/dL (ref 8–23)
CO2: 16 mmol/L — ABNORMAL LOW (ref 22–32)
Calcium: 10.1 mg/dL (ref 8.9–10.3)
Chloride: 99 mmol/L (ref 98–111)
Creatinine, Ser: 1.4 mg/dL — ABNORMAL HIGH (ref 0.61–1.24)
GFR, Estimated: 56 mL/min — ABNORMAL LOW (ref >60)
Glucose, Bld: 71 mg/dL (ref 70–99)
Potassium: 3.2 mmol/L — ABNORMAL LOW (ref 3.5–5.1)
Sodium: 139 mmol/L (ref 135–145)
Total Bilirubin: 2.7 mg/dL — ABNORMAL HIGH (ref 0.3–1.2)
Total Protein: 8.3 g/dL — ABNORMAL HIGH (ref 6.5–8.1)

## 2023-03-14 LAB — VITAMIN D 25 HYDROXY (VIT D DEFICIENCY, FRACTURES): Vit D, 25-Hydroxy: 7.07 ng/mL — ABNORMAL LOW (ref 30–100)

## 2023-03-14 LAB — VITAMIN B12: Vitamin B-12: 995 pg/mL — ABNORMAL HIGH (ref 180–914)

## 2023-03-14 LAB — AMMONIA: Ammonia: 26 umol/L (ref 9–35)

## 2023-03-14 LAB — PHOSPHORUS: Phosphorus: 4.3 mg/dL (ref 2.5–4.6)

## 2023-03-14 LAB — CBG MONITORING, ED: Glucose-Capillary: 71 mg/dL (ref 70–99)

## 2023-03-14 LAB — MAGNESIUM: Magnesium: 1.5 mg/dL — ABNORMAL LOW (ref 1.7–2.4)

## 2023-03-14 LAB — ETHANOL: Alcohol, Ethyl (B): <10 mg/dL (ref <10)

## 2023-03-14 LAB — CK: Total CK: 430 U/L — ABNORMAL HIGH (ref 49–397)

## 2023-03-14 LAB — PROTIME-INR
INR: 1.1 (ref 0.8–1.2)
Prothrombin Time: 14.6 s (ref 11.4–15.2)

## 2023-03-14 LAB — FOLATE: Folate: 12 ng/mL (ref >5.9)

## 2023-03-14 MED ORDER — ADULT MULTIVITAMIN W/MINERALS CH
1.0000 | ORAL_TABLET | Freq: Every day | ORAL | Status: DC
  Administered 2023-03-14 – 2023-03-19 (×6): 1 via ORAL
  Filled 2023-03-14 (×6): qty 1

## 2023-03-14 MED ORDER — FOLIC ACID 1 MG PO TABS
1.0000 mg | ORAL_TABLET | Freq: Every day | ORAL | Status: DC
  Administered 2023-03-14 – 2023-03-19 (×6): 1 mg via ORAL
  Filled 2023-03-14 (×6): qty 1

## 2023-03-14 MED ORDER — CHLORDIAZEPOXIDE HCL 5 MG PO CAPS: 25.0000 mg | ORAL_CAPSULE | ORAL | Status: DC

## 2023-03-14 MED ORDER — MAGNESIUM SULFATE 2 GM/50ML IV SOLN
2.0000 g | Freq: Once | INTRAVENOUS | Status: AC
  Administered 2023-03-14: 2 g via INTRAVENOUS
  Filled 2023-03-14: qty 50

## 2023-03-14 MED ORDER — VITAMIN D (ERGOCALCIFEROL) 1.25 MG (50000 UNIT) PO CAPS
50000.0000 [IU] | ORAL_CAPSULE | ORAL | Status: DC
Start: 2023-03-14 — End: 2023-03-19
  Filled 2023-03-14: qty 1

## 2023-03-14 MED ORDER — HYDROXYZINE HCL 25 MG PO TABS: 25.0000 mg | ORAL_TABLET | Freq: Four times a day (QID) | ORAL | Status: DC | PRN

## 2023-03-14 MED ORDER — THIAMINE HCL 100 MG/ML IJ SOLN: 100.0000 mg | Freq: Every day | INTRAMUSCULAR | Status: DC

## 2023-03-14 MED ORDER — LORAZEPAM 1 MG PO TABS: 0.0000 mg | ORAL_TABLET | Freq: Two times a day (BID) | ORAL | Status: DC

## 2023-03-14 MED ORDER — ATENOLOL 50 MG PO TABS
50.0000 mg | ORAL_TABLET | Freq: Every day | ORAL | Status: DC
  Administered 2023-03-15 – 2023-03-19 (×4): 50 mg via ORAL
  Filled 2023-03-14 (×5): qty 1

## 2023-03-14 MED ORDER — CHLORDIAZEPOXIDE HCL 5 MG PO CAPS: 25.0000 mg | ORAL_CAPSULE | Freq: Three times a day (TID) | ORAL | Status: DC

## 2023-03-14 MED ORDER — CHLORDIAZEPOXIDE HCL 5 MG PO CAPS
25.0000 mg | ORAL_CAPSULE | Freq: Four times a day (QID) | ORAL | Status: DC
  Filled 2023-03-14: qty 1

## 2023-03-14 MED ORDER — LORAZEPAM 2 MG/ML IJ SOLN
1.0000 mg | INTRAMUSCULAR | Status: AC | PRN
  Administered 2023-03-14: 3 mg via INTRAVENOUS
  Filled 2023-03-14: qty 2

## 2023-03-14 MED ORDER — ENOXAPARIN SODIUM 40 MG/0.4ML IJ SOSY
40.0000 mg | PREFILLED_SYRINGE | INTRAMUSCULAR | Status: DC
  Administered 2023-03-14: 40 mg via SUBCUTANEOUS
  Filled 2023-03-14 (×2): qty 0.4

## 2023-03-14 MED ORDER — LORAZEPAM 2 MG/ML IJ SOLN
0.0000 mg | Freq: Four times a day (QID) | INTRAMUSCULAR | Status: DC
  Administered 2023-03-14: 1 mg via INTRAVENOUS
  Filled 2023-03-14: qty 1

## 2023-03-14 MED ORDER — SENNOSIDES-DOCUSATE SODIUM 8.6-50 MG PO TABS: 1.0000 | ORAL_TABLET | Freq: Every evening | ORAL | Status: DC | PRN

## 2023-03-14 MED ORDER — THIAMINE MONONITRATE 100 MG PO TABS
100.0000 mg | ORAL_TABLET | Freq: Every day | ORAL | Status: DC
  Administered 2023-03-14: 100 mg via ORAL
  Filled 2023-03-14: qty 1

## 2023-03-14 MED ORDER — CHLORDIAZEPOXIDE HCL 5 MG PO CAPS
25.0000 mg | ORAL_CAPSULE | Freq: Every day | ORAL | Status: DC
Start: 2023-03-17 — End: 2023-03-15

## 2023-03-14 MED ORDER — ONDANSETRON HCL 4 MG/2ML IJ SOLN: 4.0000 mg | Freq: Four times a day (QID) | INTRAMUSCULAR | Status: DC | PRN

## 2023-03-14 MED ORDER — THIAMINE MONONITRATE 100 MG PO TABS
100.0000 mg | ORAL_TABLET | Freq: Every day | ORAL | Status: DC
Start: 2023-03-14 — End: 2023-03-14

## 2023-03-14 MED ORDER — LACTATED RINGERS IV SOLN: INTRAVENOUS | Status: DC

## 2023-03-14 MED ORDER — LORAZEPAM 1 MG PO TABS
1.0000 mg | ORAL_TABLET | ORAL | Status: AC | PRN
  Administered 2023-03-15: 1 mg via ORAL
  Filled 2023-03-14: qty 1

## 2023-03-14 MED ORDER — POTASSIUM CHLORIDE CRYS ER 20 MEQ PO TBCR: 40.0000 meq | EXTENDED_RELEASE_TABLET | Freq: Two times a day (BID) | ORAL | Status: DC

## 2023-03-14 MED ORDER — LORAZEPAM 1 MG PO TABS: 0.0000 mg | ORAL_TABLET | Freq: Four times a day (QID) | ORAL | Status: DC

## 2023-03-14 MED ORDER — LABETALOL HCL 5 MG/ML IV SOLN: 10.0000 mg | INTRAVENOUS | Status: DC | PRN

## 2023-03-14 MED ORDER — ACETAMINOPHEN 650 MG RE SUPP: 650.0000 mg | Freq: Four times a day (QID) | RECTAL | Status: DC | PRN

## 2023-03-14 MED ORDER — ONDANSETRON HCL 4 MG PO TABS: 4.0000 mg | ORAL_TABLET | Freq: Four times a day (QID) | ORAL | Status: DC | PRN

## 2023-03-14 MED ORDER — THIAMINE HCL 100 MG/ML IJ SOLN
100.0000 mg | Freq: Every day | INTRAMUSCULAR | Status: DC
Start: 2023-03-14 — End: 2023-03-14
  Administered 2023-03-14: 100 mg via INTRAVENOUS
  Filled 2023-03-14: qty 2

## 2023-03-14 MED ORDER — ACETAMINOPHEN 325 MG PO TABS
650.0000 mg | ORAL_TABLET | Freq: Four times a day (QID) | ORAL | Status: DC | PRN
  Administered 2023-03-18: 650 mg via ORAL
  Filled 2023-03-14 (×3): qty 2

## 2023-03-14 MED ORDER — LORAZEPAM 2 MG/ML IJ SOLN: 0.0000 mg | Freq: Two times a day (BID) | INTRAMUSCULAR | Status: DC

## 2023-03-14 MED ORDER — LOPERAMIDE HCL 2 MG PO CAPS: 2.0000 mg | ORAL_CAPSULE | ORAL | Status: DC | PRN

## 2023-03-14 NOTE — ED Notes (Signed)
Brittany(daughter) updated as to patient's status.

## 2023-03-14 NOTE — ED Notes (Signed)
Patient transported to CT 

## 2023-03-14 NOTE — ED Notes (Signed)
Pt noted with seizure -like activity. Pt alert with eyes open, respirations even,non-labored at this time. Call placed to Dr Kirke Corin, voice message left, waiting return call.

## 2023-03-14 NOTE — H&P (Signed)
History and Physical    Patient: Steve Kelly BMW:[id number] DOB: 03/03/1956 DOA: 03/14/2023 DOS: the patient was seen and examined on 03/14/2023 PCP: Ellis Parents, FNP  Patient coming from: Home  Chief Complaint:  Chief Complaint  Patient presents with   Altered Mental Status   HPI: Steve Kelly is a 67 y.o. male with medical history significant of alcohol use disorder, HTN and vitamin D deficiency who presents to the ED with altered mental status.  According to family, patient or his at baseline 3 days ago. Today, he appeared weak and confused with difficulty ambulating. He has had multiple falls in the last month. During my evaluation, patient in mittens and still disoriented. Speech is occasionally incomprehensible.  Marland Kitchen He reports drinking a pint of liquor a day and has been drinking since he was 67 years old. He reports his last drink was yesterday. He reports some neck pain but denies any chest pain, abdominal pain, fever or headache.  ED course: Hypertensive and tachycardic with initial CIWA score of 2.  Labs significant for K+ 3.2, NA 139, creatinine 1.4, CO2 16, AST/ALT 67/28, bilirubin 2.7, WBC 4.5, Hgb 12.1, platelet 100, ammonia 26, negative ethanol, mag 1.5, phos 4.3, CK4 30, PT/INR 14.6/1.1, vitamin B12 985, folate 12.0, vitamin D 7.07.  Left knee x-ray with no acute finding, CTH without acute intracranial abnormality CT cervical spine also without acute findings.  Patient was started on CIWA protocol and hospitalist was consulted for admission.  Review of Systems: As mentioned in the history of present illness. All other systems reviewed and are negative. Past Medical History:  Diagnosis Date   Alcohol abuse    Heart murmur on physical examination    Hypertension    Tremor    Vitamin D deficiency    History reviewed. No pertinent surgical history. Social History:  reports that he has never smoked. He has never used smokeless tobacco. He reports current alcohol  use. He reports that he does not use drugs.  States he currently lives with his son and his son's wife.  He stopped smoking cigarettes 20 years ago.  He drinks 1 pint of liquor daily, sometimes 2.  He denies any drug use.  No Known Allergies  Family History  Problem Relation Age of Onset   Diabetes Mother    Hypertension Mother    Hyperlipidemia Mother    Hypertension Father    Heart attack Sister    Heart attack Brother     Prior to Admission medications   Medication Sig Start Date End Date Taking? Authorizing Provider  atenolol (TENORMIN) 50 MG tablet Take 50 mg by mouth daily. 09/16/22  Yes [provider]    Physical Exam: Vitals:   03/14/23 1315 03/14/23 1459 03/14/23 1504 03/14/23 1627  BP: (!) 150/91 (!) 162/107 (!) 162/107 (!) 162/107  Pulse:  94 94 94  Resp:  18    Temp:  (!) 97.4 F (36.3 C)    TempSrc:  Oral    SpO2:  99%    Weight:      Height:       General: Drowsy elderly man laying in bed. No acute distress. HEENT: Dry mucous membrane.  [place]/AT. CV: Mild tachycardic.  Regular rhythm. No murmurs, rubs, or gallops. No LE edema Pulmonary: Lungs CTAB. Normal effort. No wheezing or rales. Abdominal: Soft, nontender, nondistended. Normal bowel sounds. Extremities: Palpable radial and DP pulses. Normal ROM. Skin: Warm and dry. No obvious rash or lesions.  Decreased  skin turgor. Neuro: Drowsy, eyes open spontaneously. Oriented to self and person but not to place or time.  Normal sensation to light touch. Mild intentional tremor.  Data Reviewed:  EKG with normal sinus and LVH K+ 3.2, NA 139, creatinine 1.4, CO2 16, AST/ALT 67/28, bilirubin 2.7, WBC 4.5, Hgb 12.1, platelet 100, ammonia 26, negative ethanol, mag 1.5, phos 4.3, CK4 30, PT/INR 14.6/1.1, vitamin B12 985, folate 12.0, vitamin D 7.07.  Left knee x-ray with no acute finding, CTH without acute intracranial abnormality CT cervical spine also without acute findings  Assessment and Plan: Steve Kelly  is a 67 y.o. male with medical history significant of alcohol use disorder, HTN and vitamin D deficiency admitted for alcohol withdrawal.  # Alcohol withdrawal Patient with significant alcohol use disorder and multiple recent falls presented with altered mental status.  Baseline mental status A&Ox4, currently drowsy and oriented only to self and person.  CTH without acute intracranial abnormalities.  CIWA scores trending up, 2->3 ->7->17. After admission, patient with a weakness seizure-like activity in the ED that lasted a few seconds. IV Ativan 3 mg given. -Admit to progressive bed -CIWA protocol with Ativan -Librium taper -MVI, folate and thiamine -Trend and replete electrolytes -Check EEG  # Alcohol use disorder Patient tells me he drinks 1 pint of liquor per day and sometimes drinks 2. States he has been drinking since he was 67 years old.  Denies any history of alcohol withdrawal. -TOC consult for substance abuse resources  # Thrombocytopenia Patient with platelet of 100. He has mildly elevated LFTs with 2:1 ratio of AST to ALT concerning for possible alcoholic cirrhosis.  PT/INR within normal limits.  Thrombocytopenia likely early evidence of cirrhosis. -Check RUQ U/S -Trend CBC  # HTN Patient with a history of hypertension on atenolol 50 mg daily.  BP elevated with SBP in the 140s to 160s in the setting of alcohol withdrawal. -Atenolol 50 mg daily -IV labetalol 10 mg q2h for SBP >180  # Vitamin D deficiency Vitamin D level significant low at 7.07 -Vitamin D 50,000 units every 7 days  # Electrolyte abnormalities Magnesium of 1.5 and K+ 3.2 on admission.  Secondary to alcohol use disorder. -Repleting -Follow-up morning mag and K+  # AKI Creatinine of 1.4 on admission likely secondary to dehydration in the setting of alcohol use disorder -IVF hydration -Avoid nephrotoxic agents  # Elevated CK CK of 430. Unknown to family if patient was down but he looks dry on exam. -IVF  hydration as above   Advance Care Planning:   Code Status: Full Code   Consults: None  Family Communication: No family at bedside  Severity of Illness: The appropriate patient status for this patient is INPATIENT. Inpatient status is judged to be reasonable and necessary in order to provide the required intensity of service to ensure the patient's safety. The patient's presenting symptoms, physical exam findings, and initial radiographic and laboratory data in the context of their chronic comorbidities is felt to place them at high risk for further clinical deterioration. Furthermore, it is not anticipated that the patient will be medically stable for discharge from the hospital within 2 midnights of admission.   * I certify that at the point of admission it is my clinical judgment that the patient will require inpatient hospital care spanning beyond 2 midnights from the point of admission due to high intensity of service, high risk for further deterioration and high frequency of surveillance required.*  Author: Steffanie Rainwater, MD  03/14/2023 4:31 PM  For on call review [email or web address].

## 2023-03-14 NOTE — ED Notes (Signed)
ED TO INPATIENT HANDOFF REPORT  ED Nurse Name and Phone #: Precious Gilding  S Name/Age/Gender Steve Kelly 67 y.o. male Room/Bed: 044C/044C  Code Status   Code Status: Not on file  Home/SNF/Other Home Patient oriented to: self Is this baseline? No   Triage Complete: Triage complete  Chief Complaint sick  Triage Note PT BIB [facility] for AMS per familt. LSW by family on Thursday. Has had 6 falls in the last month. Baseline is A&OX4 and ambulatory but now he can not walk and he is A&OX2. Pt drinks everyday per family but has not had drink since last night. Stroke screen neg. Has left knee pain.   Allergies No Known Allergies  Level of Care/Admitting Diagnosis ED Disposition     ED Disposition  Admit   Condition  --   Comment  The patient appears reasonably stabilized for admission considering the current resources, flow, and capabilities available in the ED at this time, and I doubt any other [facility] requiring further screening and/or treatment in the ED prior to admission is  present.          B Medical/Surgery History Past Medical History:  Diagnosis Date   Alcohol abuse    Heart murmur on physical examination    Hypertension    Tremor    Vitamin D deficiency    History reviewed. No pertinent surgical history.   A IV Location/Drains/Wounds Patient Lines/Drains/Airways Status     Active Line/Drains/Airways     Name Placement date Placement time Site Days   Peripheral IV 03/14/23 18 G Anterior;Distal;Right;Upper Arm 03/14/23  1132  Arm  less than 1   Peripheral IV 03/14/23 Anterior;Distal;Left;Upper Arm 03/14/23  1132  Arm  less than 1            Intake/Output Last 24 hours No intake or output data in the 24 hours ending 03/14/23 1520  Labs/Imaging Results for orders placed or performed during the hospital encounter of 03/14/23 (from the past 48 hour(s))  Ammonia     Status: None   Collection Time: 03/14/23 11:30 AM  Result Value Ref  Range   Ammonia 26 9 - 35 umol/L    Comment: Performed at [facility], [address], [place], [place] [address]  CBG monitoring, ED     Status: None   Collection Time: 03/14/23 11:33 AM  Result Value Ref Range   Glucose-Capillary 71 70 - 99 mg/dL    Comment: Glucose reference range applies only to samples taken after fasting for at least 8 hours.  CBC     Status: Abnormal   Collection Time: 03/14/23 11:36 AM  Result Value Ref Range   WBC 4.5 4.0 - 10.5 K/uL   RBC 3.55 (L) 4.22 - 5.81 MIL/uL   Hemoglobin 12.1 (L) 13.0 - 17.0 g/dL   HCT 62.1 (L) 30.8 - 65.7 %   MCV 99.7 80.0 - 100.0 fL   MCH 34.1 (H) 26.0 - 34.0 pg   MCHC 34.2 30.0 - 36.0 g/dL   RDW 84.6 96.2 - 95.2 %   Platelets 100 (L) 150 - 400 K/uL   nRBC 0.0 0.0 - 0.2 %    Comment: Performed at [facility], [address], [place], [place] [address]  Comprehensive metabolic panel     Status: Abnormal   Collection Time: 03/14/23 11:36 AM  Result Value Ref Range   Sodium 139 135 - 145 mmol/L   Potassium 3.2 (L) 3.5 - 5.1  mmol/L   Chloride 99 98 - 111 mmol/L   CO2 16 (L) 22 - 32 mmol/L   Glucose, Bld 71 70 - 99 mg/dL    Comment: Glucose reference range applies only to samples taken after fasting for at least 8 hours.   BUN 23 8 - 23 mg/dL   Creatinine, Ser 4.74 (H) 0.61 - 1.24 mg/dL   Calcium 25.9 8.9 - 56.3 mg/dL   Total Protein 8.3 (H) 6.5 - 8.1 g/dL   Albumin 4.0 3.5 - 5.0 g/dL   AST 67 (H) 15 - 41 U/L   ALT 28 0 - 44 U/L   Alkaline Phosphatase 40 38 - 126 U/L   Total Bilirubin 2.7 (H) 0.3 - 1.2 mg/dL   GFR, Estimated 56 (L) >60 mL/min    Comment: (NOTE) Calculated using the CKD-EPI Creatinine Equation (2021)    Anion gap 24 (H) 5 - 15    Comment: ELECTROLYTES REPEATED TO VERIFY Performed at [facility], [address] [place], [place] [address]   Ethanol     Status: None   Collection Time: 03/14/23 11:36 AM  Result Value Ref Range   Alcohol, Ethyl (B) <10 <10 mg/dL    Comment:  (NOTE) Lowest detectable limit for serum alcohol is 10 mg/dL.  For medical purposes only. Performed at [facility], [address], [place], [place] [address]    CT Head Wo Contrast  Result Date: 03/14/2023 CLINICAL DATA:  Head trauma, pain EXAM: CT HEAD WITHOUT CONTRAST CT CERVICAL SPINE WITHOUT CONTRAST TECHNIQUE: Multidetector CT imaging of the head and cervical spine was performed following the standard protocol without intravenous contrast. Multiplanar CT image reconstructions of the cervical spine were also generated. RADIATION DOSE REDUCTION: This exam was performed according to the departmental dose-optimization program which includes automated exposure control, adjustment of the mA and/or kV according to patient size and/or use of iterative reconstruction technique. COMPARISON:  CT head and cervical spine 01/16/2017 FINDINGS: CT HEAD FINDINGS Brain: There is no acute intracranial hemorrhage, extra-axial fluid collection, or acute infarct. Parenchymal volume is normal. The ventricles are normal in size. Gray-white differentiation is preserved. Hypodensity in the supratentorial white matter likely reflects underlying chronic small-vessel ischemic change. 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 The pituitary and suprasellar region are normal. There is no mass lesion. There is no mass effect or midline shift. Vascular: No hyperdense vessel or unexpected calcification. Skull: Normal. Negative for fracture or focal lesion. Sinuses/Orbits: The imaged paranasal sinuses are clear. The globes and orbits are unremarkable. Other: The mastoid air cells and middle ear cavities are clear. CT CERVICAL SPINE FINDINGS Alignment: There is mild degenerative anterolisthesis of C2 on C3. There is no evidence of traumatic malalignment. Skull base and vertebrae: Skull base alignment is maintained. Vertebral body heights are preserved. There is no evidence of acute fracture. There is no  suspicious osseous lesion. Soft tissues and spinal canal: No prevertebral fluid or swelling. No visible canal hematoma. Disc levels: There is severe multilevel disc space narrowing and degenerative endplate change with prominent anterior osteophytes throughout the cervical spine. There is osseous fusion across the C5-C6 disc space. There is severe left facet arthropathy at C2-C3. Upper chest: The imaged lung apices are clear. Other: None. IMPRESSION: 1. No acute intracranial pathology. 2. No acute fracture or traumatic malalignment of the cervical spine. 3. Advanced multilevel degenerative changes as above. Electronically Signed   By: Lesia Hausen M.D.   On: 03/14/2023  12:58   CT Cervical Spine Wo Contrast  Result Date: 03/14/2023 CLINICAL DATA:  Head trauma, pain EXAM: CT HEAD WITHOUT CONTRAST CT CERVICAL SPINE WITHOUT CONTRAST TECHNIQUE: Multidetector CT imaging of the head and cervical spine was performed following the standard protocol without intravenous contrast. Multiplanar CT image reconstructions of the cervical spine were also generated. RADIATION DOSE REDUCTION: This exam was performed according to the departmental dose-optimization program which includes automated exposure control, adjustment of the mA and/or kV according to patient size and/or use of iterative reconstruction technique. COMPARISON:  CT head and cervical spine 01/16/2017 FINDINGS: CT HEAD FINDINGS Brain: There is no acute intracranial hemorrhage, extra-axial fluid collection, or acute infarct. Parenchymal volume is normal. The ventricles are normal in size. Gray-white differentiation is preserved. Hypodensity in the supratentorial white matter likely reflects underlying chronic small-vessel ischemic change. 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 The pituitary and suprasellar region are normal. There is no mass lesion. There is no mass effect or midline shift. Vascular: No hyperdense vessel or unexpected  calcification. Skull: Normal. Negative for fracture or focal lesion. Sinuses/Orbits: The imaged paranasal sinuses are clear. The globes and orbits are unremarkable. Other: The mastoid air cells and middle ear cavities are clear. CT CERVICAL SPINE FINDINGS Alignment: There is mild degenerative anterolisthesis of C2 on C3. There is no evidence of traumatic malalignment. Skull base and vertebrae: Skull base alignment is maintained. Vertebral body heights are preserved. There is no evidence of acute fracture. There is no suspicious osseous lesion. Soft tissues and spinal canal: No prevertebral fluid or swelling. No visible canal hematoma. Disc levels: There is severe multilevel disc space narrowing and degenerative endplate change with prominent anterior osteophytes throughout the cervical spine. There is osseous fusion across the C5-C6 disc space. There is severe left facet arthropathy at C2-C3. Upper chest: The imaged lung apices are clear. Other: None. IMPRESSION: 1. No acute intracranial pathology. 2. No acute fracture or traumatic malalignment of the cervical spine. 3. Advanced multilevel degenerative changes as above. Electronically Signed   By: Lesia Hausen M.D.   On: 03/14/2023 12:58   DG Knee Complete 4 Views Left  Result Date: 03/14/2023 CLINICAL DATA:  Fall, pain EXAM: LEFT KNEE - COMPLETE 4+ VIEW COMPARISON:  None Available. FINDINGS: There is no acute fracture or dislocation. Knee alignment is normal. There is moderate medial compartment joint space narrowing with associated subchondral sclerosis and osteophytosis. There is no erosive change. There is no significant suprapatellar effusion, though positioning is suboptimal on the lateral projection. IMPRESSION: No acute finding. Moderate medial compartment joint space narrowing. Electronically Signed   By: Lesia Hausen M.D.   On: 03/14/2023 11:56    Pending Labs Unresulted Labs (From admission, onward)     Start     Ordered   03/14/23 1505  Vitamin  B12  Once,   URGENT        03/14/23 1505   03/14/23 1505  Folate  Once,   URGENT        03/14/23 1505   03/14/23 1505  VITAMIN D 25 Hydroxy (Vit-D Deficiency, Fractures)  Once,   URGENT        03/14/23 1505   03/14/23 1504  Protime-INR  Once,   STAT        03/14/23 1505   03/14/23 1503  Phosphorus  Once,   STAT        03/14/23 1505   03/14/23 1503  CK  Once,   URGENT  03/14/23 1505   03/14/23 1439  Magnesium  Once,   STAT        03/14/23 1441   03/14/23 1122  Urinalysis, Routine w reflex microscopic -Urine, Clean Catch  ONCE - STAT,   URGENT       Question:  Specimen Source  Answer:  Urine, Clean Catch   03/14/23 1122   03/14/23 1122  Rapid urine drug screen (hospital performed)  ONCE - STAT,   STAT        03/14/23 1122            Vitals/Pain Today's Vitals   03/14/23 1305 03/14/23 1315 03/14/23 1459 03/14/23 1504  BP: (!) 150/91 (!) 150/91 (!) 162/107 (!) 162/107  Pulse: 89  94 94  Resp: 18  18   Temp:   (!) 97.4 F (36.3 C)   TempSrc:   Oral   SpO2: 99%  99%   Weight:      Height:      PainSc:        Isolation Precautions No active isolations  Medications Medications  LORazepam (ATIVAN) injection 0-4 mg (1 mg Intravenous Given 03/14/23 1506)    Or  LORazepam (ATIVAN) tablet 0-4 mg ( Oral See Alternative 03/14/23 1506)  LORazepam (ATIVAN) injection 0-4 mg (has no administration in time range)    Or  LORazepam (ATIVAN) tablet 0-4 mg (has no administration in time range)  thiamine (VITAMIN B1) tablet 100 mg ( Oral See Alternative 03/14/23 1505)    Or  thiamine (VITAMIN B1) injection 100 mg (100 mg Intravenous Given 03/14/23 1505)    Mobility walks with person assist - baseline in fully functioning on his own with family.      Focused Assessments Neuro Assessment Handoff:     Last date known well: 03/11/23   Neuro Assessment: Within Defined Limits (unable to lift left leg due to the Pt having knee pain, but sensory within limits, strength equal, no  other neurological deficits noted.) Neuro Checks:      Has TPA been given? No If patient is a Neuro Trauma and patient is going to OR before floor call report to 4N Charge nurse: [phone] or [phone]   R Recommendations: See Admitting Provider Note  Report given to:   Additional Notes:

## 2023-03-14 NOTE — ED Notes (Signed)
Pt pulled his IV out and was covered in blood.  Pt urinated on the bed.  Pt was cleaned, bandaged from old IV spot, new brief placed, and Pt placed in a gown.

## 2023-03-14 NOTE — ED Provider Notes (Addendum)
[place] AT [facility], [place] Provider Note   CSN: [id number] Arrival date & time: 03/14/23  1116     History  Chief Complaint  Patient presents with   Altered Mental Status    MIR FULLILOVE is a 67 y.o. male.  Pt with ?hx etoh use disorder, presents with altered mental status. Per EMS report, family had last seem three days ago when patient seemed at baseline. Today appeared generally weak and confused. Pt/family reported recent falls. Pts only c/o is left knee pain. Patient extremely limited historian - level 5 caveat. EMS indicates pt too weak to walk to stretcher.  CBG 75. No report of fevers. Pt denies headache. No chest pain or sob. No abd pain or nv. No other extremity pain or injury.   The history is provided by the patient, the EMS personnel and medical records. The history is limited by the condition of the patient.  Altered Mental Status Presenting symptoms: confusion   Associated symptoms: no abdominal pain, no fever, no headaches, no rash and no vomiting        Home Medications Prior to Admission medications   Medication Sig Start Date End Date Taking? Authorizing Provider  atenolol (TENORMIN) 50 MG tablet Take 50 mg by mouth daily. 09/16/22   [provider]      Allergies    Patient has no known allergies.    Review of Systems   Review of Systems  Constitutional:  Negative for chills and fever.  HENT:  Negative for sore throat.   Eyes:  Negative for visual disturbance.  Respiratory:  Negative for cough and shortness of breath.   Cardiovascular:  Negative for chest pain.  Gastrointestinal:  Negative for abdominal pain and vomiting.  Genitourinary:  Negative for dysuria and flank pain.  Musculoskeletal:  Negative for back pain and neck pain.  Skin:  Negative for rash and wound.  Neurological:  Negative for headaches.  Psychiatric/Behavioral:  Positive for confusion.     Physical Exam Updated Vital Signs BP (!)  150/91   Pulse 89   Temp 98.7 F (37.1 C) (Oral)   Resp 18   Ht 1.753 m (5\' 9" )   Wt 68 kg   SpO2 99%   BMI 22.15 kg/m  Physical Exam Vitals and nursing note reviewed.  Constitutional:      Appearance: Normal appearance. He is well-developed.  HENT:     Head: Atraumatic.     Nose: Nose normal.     Mouth/Throat:     Mouth: Mucous membranes are moist.     Pharynx: Oropharynx is clear.  Eyes:     General: No scleral icterus.    Conjunctiva/sclera: Conjunctivae normal.     Pupils: Pupils are equal, round, and reactive to light.  Neck:     Vascular: No carotid bruit.     Trachea: No tracheal deviation.     Comments: No stiffness or rigidity.  Cardiovascular:     Rate and Rhythm: Normal rate and regular rhythm.     Pulses: Normal pulses.     Heart sounds: Normal heart sounds. No murmur heard.    No friction rub. No gallop.  Pulmonary:     Effort: Pulmonary effort is normal. No accessory muscle usage or respiratory distress.     Breath sounds: Normal breath sounds.  Abdominal:     General: Bowel sounds are normal. There is no distension.     Palpations: Abdomen is soft.     Tenderness:  There is no abdominal tenderness.  Genitourinary:    Comments: No cva tenderness. Musculoskeletal:        General: No swelling or tenderness.     Cervical back: Normal range of motion and neck supple. No rigidity.     Comments: CTLS spine, non tender, aligned, no step off. Tenderness left knee. Knee is grossly stable. No effusion. Otherwise good rom bil extremities without pain or other focal bony tenderness.   Skin:    General: Skin is warm and dry.     Findings: No rash.  Neurological:     Mental Status: He is alert.     Comments: Alert, speech clear. Motor/sens fxn grossly intact bil.   Psychiatric:        Mood and Affect: Mood normal.     ED Results / Procedures / Treatments   Labs (all labs ordered are listed, but only abnormal results are displayed) Results for orders placed  or performed during the hospital encounter of 03/14/23  CBC  Result Value Ref Range   WBC 4.5 4.0 - 10.5 K/uL   RBC 3.55 (L) 4.22 - 5.81 MIL/uL   Hemoglobin 12.1 (L) 13.0 - 17.0 g/dL   HCT 40.9 (L) 81.1 - 91.4 %   MCV 99.7 80.0 - 100.0 fL   MCH 34.1 (H) 26.0 - 34.0 pg   MCHC 34.2 30.0 - 36.0 g/dL   RDW 78.2 95.6 - 21.3 %   Platelets 100 (L) 150 - 400 K/uL   nRBC 0.0 0.0 - 0.2 %  Comprehensive metabolic panel  Result Value Ref Range   Sodium 139 135 - 145 mmol/L   Potassium 3.2 (L) 3.5 - 5.1 mmol/L   Chloride 99 98 - 111 mmol/L   CO2 16 (L) 22 - 32 mmol/L   Glucose, Bld 71 70 - 99 mg/dL   BUN 23 8 - 23 mg/dL   Creatinine, Ser 0.86 (H) 0.61 - 1.24 mg/dL   Calcium 57.8 8.9 - 46.9 mg/dL   Total Protein 8.3 (H) 6.5 - 8.1 g/dL   Albumin 4.0 3.5 - 5.0 g/dL   AST 67 (H) 15 - 41 U/L   ALT 28 0 - 44 U/L   Alkaline Phosphatase 40 38 - 126 U/L   Total Bilirubin 2.7 (H) 0.3 - 1.2 mg/dL   GFR, Estimated 56 (L) >60 mL/min   Anion gap 24 (H) 5 - 15  Ethanol  Result Value Ref Range   Alcohol, Ethyl (B) <10 <10 mg/dL  Ammonia  Result Value Ref Range   Ammonia 26 9 - 35 umol/L  CBG monitoring, ED  Result Value Ref Range   Glucose-Capillary 71 70 - 99 mg/dL   CT Head Wo Contrast  Result Date: 03/14/2023 CLINICAL DATA:  Head trauma, pain EXAM: CT HEAD WITHOUT CONTRAST CT CERVICAL SPINE WITHOUT CONTRAST TECHNIQUE: Multidetector CT imaging of the head and cervical spine was performed following the standard protocol without intravenous contrast. Multiplanar CT image reconstructions of the cervical spine were also generated. RADIATION DOSE REDUCTION: This exam was performed according to the departmental dose-optimization program which includes automated exposure control, adjustment of the mA and/or kV according to patient size and/or use of iterative reconstruction technique. COMPARISON:  CT head and cervical spine 01/16/2017 FINDINGS: CT HEAD FINDINGS Brain: There is no acute intracranial  hemorrhage, extra-axial fluid collection, or acute infarct. Parenchymal volume is normal. The ventricles are normal in size. Gray-white differentiation is preserved. Hypodensity in the supratentorial white matter likely reflects underlying chronic small-vessel  ischemic change. 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 The pituitary and suprasellar region are normal. There is no mass lesion. There is no mass effect or midline shift. Vascular: No hyperdense vessel or unexpected calcification. Skull: Normal. Negative for fracture or focal lesion. Sinuses/Orbits: The imaged paranasal sinuses are clear. The globes and orbits are unremarkable. Other: The mastoid air cells and middle ear cavities are clear. CT CERVICAL SPINE FINDINGS Alignment: There is mild degenerative anterolisthesis of C2 on C3. There is no evidence of traumatic malalignment. Skull base and vertebrae: Skull base alignment is maintained. Vertebral body heights are preserved. There is no evidence of acute fracture. There is no suspicious osseous lesion. Soft tissues and spinal canal: No prevertebral fluid or swelling. No visible canal hematoma. Disc levels: There is severe multilevel disc space narrowing and degenerative endplate change with prominent anterior osteophytes throughout the cervical spine. There is osseous fusion across the C5-C6 disc space. There is severe left facet arthropathy at C2-C3. Upper chest: The imaged lung apices are clear. Other: None. IMPRESSION: 1. No acute intracranial pathology. 2. No acute fracture or traumatic malalignment of the cervical spine. 3. Advanced multilevel degenerative changes as above. Electronically Signed   By: Lesia Hausen M.D.   On: 03/14/2023 12:58   CT Cervical Spine Wo Contrast  Result Date: 03/14/2023 CLINICAL DATA:  Head trauma, pain EXAM: CT HEAD WITHOUT CONTRAST CT CERVICAL SPINE WITHOUT CONTRAST TECHNIQUE: Multidetector CT imaging of the head and cervical spine was  performed following the standard protocol without intravenous contrast. Multiplanar CT image reconstructions of the cervical spine were also generated. RADIATION DOSE REDUCTION: This exam was performed according to the departmental dose-optimization program which includes automated exposure control, adjustment of the mA and/or kV according to patient size and/or use of iterative reconstruction technique. COMPARISON:  CT head and cervical spine 01/16/2017 FINDINGS: CT HEAD FINDINGS Brain: There is no acute intracranial hemorrhage, extra-axial fluid collection, or acute infarct. Parenchymal volume is normal. The ventricles are normal in size. Gray-white differentiation is preserved. Hypodensity in the supratentorial white matter likely reflects underlying chronic small-vessel ischemic change. 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 The pituitary and suprasellar region are normal. There is no mass lesion. There is no mass effect or midline shift. Vascular: No hyperdense vessel or unexpected calcification. Skull: Normal. Negative for fracture or focal lesion. Sinuses/Orbits: The imaged paranasal sinuses are clear. The globes and orbits are unremarkable. Other: The mastoid air cells and middle ear cavities are clear. CT CERVICAL SPINE FINDINGS Alignment: There is mild degenerative anterolisthesis of C2 on C3. There is no evidence of traumatic malalignment. Skull base and vertebrae: Skull base alignment is maintained. Vertebral body heights are preserved. There is no evidence of acute fracture. There is no suspicious osseous lesion. Soft tissues and spinal canal: No prevertebral fluid or swelling. No visible canal hematoma. Disc levels: There is severe multilevel disc space narrowing and degenerative endplate change with prominent anterior osteophytes throughout the cervical spine. There is osseous fusion across the C5-C6 disc space. There is severe left facet arthropathy at C2-C3. Upper chest:  The imaged lung apices are clear. Other: None. IMPRESSION: 1. No acute intracranial pathology. 2. No acute fracture or traumatic malalignment of the cervical spine. 3. Advanced multilevel degenerative changes as above. Electronically Signed   By: Lesia Hausen M.D.   On: 03/14/2023 12:58   DG Knee Complete 4 Views Left  Result Date: 03/14/2023 CLINICAL DATA:  Fall, pain  EXAM: LEFT KNEE - COMPLETE 4+ VIEW COMPARISON:  None Available. FINDINGS: There is no acute fracture or dislocation. Knee alignment is normal. There is moderate medial compartment joint space narrowing with associated subchondral sclerosis and osteophytosis. There is no erosive change. There is no significant suprapatellar effusion, though positioning is suboptimal on the lateral projection. IMPRESSION: No acute finding. Moderate medial compartment joint space narrowing. Electronically Signed   By: Lesia Hausen M.D.   On: 03/14/2023 11:56    EKG None  Radiology CT Head Wo Contrast  Result Date: 03/14/2023 CLINICAL DATA:  Head trauma, pain EXAM: CT HEAD WITHOUT CONTRAST CT CERVICAL SPINE WITHOUT CONTRAST TECHNIQUE: Multidetector CT imaging of the head and cervical spine was performed following the standard protocol without intravenous contrast. Multiplanar CT image reconstructions of the cervical spine were also generated. RADIATION DOSE REDUCTION: This exam was performed according to the departmental dose-optimization program which includes automated exposure control, adjustment of the mA and/or kV according to patient size and/or use of iterative reconstruction technique. COMPARISON:  CT head and cervical spine 01/16/2017 FINDINGS: CT HEAD FINDINGS Brain: There is no acute intracranial hemorrhage, extra-axial fluid collection, or acute infarct. Parenchymal volume is normal. The ventricles are normal in size. Gray-white differentiation is preserved. Hypodensity in the supratentorial white matter likely reflects underlying chronic  small-vessel ischemic change. 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 The pituitary and suprasellar region are normal. There is no mass lesion. There is no mass effect or midline shift. Vascular: No hyperdense vessel or unexpected calcification. Skull: Normal. Negative for fracture or focal lesion. Sinuses/Orbits: The imaged paranasal sinuses are clear. The globes and orbits are unremarkable. Other: The mastoid air cells and middle ear cavities are clear. CT CERVICAL SPINE FINDINGS Alignment: There is mild degenerative anterolisthesis of C2 on C3. There is no evidence of traumatic malalignment. Skull base and vertebrae: Skull base alignment is maintained. Vertebral body heights are preserved. There is no evidence of acute fracture. There is no suspicious osseous lesion. Soft tissues and spinal canal: No prevertebral fluid or swelling. No visible canal hematoma. Disc levels: There is severe multilevel disc space narrowing and degenerative endplate change with prominent anterior osteophytes throughout the cervical spine. There is osseous fusion across the C5-C6 disc space. There is severe left facet arthropathy at C2-C3. Upper chest: The imaged lung apices are clear. Other: None. IMPRESSION: 1. No acute intracranial pathology. 2. No acute fracture or traumatic malalignment of the cervical spine. 3. Advanced multilevel degenerative changes as above. Electronically Signed   By: Lesia Hausen M.D.   On: 03/14/2023 12:58   CT Cervical Spine Wo Contrast  Result Date: 03/14/2023 CLINICAL DATA:  Head trauma, pain EXAM: CT HEAD WITHOUT CONTRAST CT CERVICAL SPINE WITHOUT CONTRAST TECHNIQUE: Multidetector CT imaging of the head and cervical spine was performed following the standard protocol without intravenous contrast. Multiplanar CT image reconstructions of the cervical spine were also generated. RADIATION DOSE REDUCTION: This exam was performed according to the departmental dose-optimization  program which includes automated exposure control, adjustment of the mA and/or kV according to patient size and/or use of iterative reconstruction technique. COMPARISON:  CT head and cervical spine 01/16/2017 FINDINGS: CT HEAD FINDINGS Brain: There is no acute intracranial hemorrhage, extra-axial fluid collection, or acute infarct. Parenchymal volume is normal. The ventricles are normal in size. Gray-white differentiation is preserved. Hypodensity in the supratentorial white matter likely reflects underlying chronic small-vessel ischemic change. 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 The pituitary and suprasellar region are normal. There is no mass lesion. There is no mass effect or midline shift. Vascular: No hyperdense vessel or unexpected calcification. Skull: Normal. Negative for fracture or focal lesion. Sinuses/Orbits: The imaged paranasal sinuses are clear. The globes and orbits are unremarkable. Other: The mastoid air cells and middle ear cavities are clear. CT CERVICAL SPINE FINDINGS Alignment: There is mild degenerative anterolisthesis of C2 on C3. There is no evidence of traumatic malalignment. Skull base and vertebrae: Skull base alignment is maintained. Vertebral body heights are preserved. There is no evidence of acute fracture. There is no suspicious osseous lesion. Soft tissues and spinal canal: No prevertebral fluid or swelling. No visible canal hematoma. Disc levels: There is severe multilevel disc space narrowing and degenerative endplate change with prominent anterior osteophytes throughout the cervical spine. There is osseous fusion across the C5-C6 disc space. There is severe left facet arthropathy at C2-C3. Upper chest: The imaged lung apices are clear. Other: None. IMPRESSION: 1. No acute intracranial pathology. 2. No acute fracture or traumatic malalignment of the cervical spine. 3. Advanced multilevel degenerative changes as above. Electronically Signed   By:  Lesia Hausen M.D.   On: 03/14/2023 12:58   DG Knee Complete 4 Views Left  Result Date: 03/14/2023 CLINICAL DATA:  Fall, pain EXAM: LEFT KNEE - COMPLETE 4+ VIEW COMPARISON:  None Available. FINDINGS: There is no acute fracture or dislocation. Knee alignment is normal. There is moderate medial compartment joint space narrowing with associated subchondral sclerosis and osteophytosis. There is no erosive change. There is no significant suprapatellar effusion, though positioning is suboptimal on the lateral projection. IMPRESSION: No acute finding. Moderate medial compartment joint space narrowing. Electronically Signed   By: Lesia Hausen M.D.   On: 03/14/2023 11:56    Procedures Procedures    Medications Ordered in ED Medications  LORazepam (ATIVAN) injection 0-4 mg (has no administration in time range)    Or  LORazepam (ATIVAN) tablet 0-4 mg (has no administration in time range)  LORazepam (ATIVAN) injection 0-4 mg (has no administration in time range)    Or  LORazepam (ATIVAN) tablet 0-4 mg (has no administration in time range)  thiamine (VITAMIN B1) tablet 100 mg (has no administration in time range)    Or  thiamine (VITAMIN B1) injection 100 mg (has no administration in time range)    ED Course/ Medical Decision Making/ A&P                                 Medical Decision Making Problems Addressed: Acute alteration in mental status: acute illness or injury with systemic symptoms that poses a threat to life or bodily functions Alcohol withdrawal syndrome with complication [facility]): acute illness or injury with systemic symptoms that poses a threat to life or bodily functions Confusion: acute illness or injury with systemic symptoms that poses a threat to life or bodily functions Elevated blood pressure reading: acute illness or injury Generalized weakness: acute illness or injury with systemic symptoms that poses a threat to life or bodily functions History of alcohol use disorder:  chronic illness or injury with exacerbation, progression, or side effects of treatment that poses a threat to life or bodily functions Hypokalemia: acute illness or injury  Amount and/or Complexity of Data Reviewed Independent Historian: EMS    Details: hx External Data Reviewed: notes. Labs: ordered. Decision-making details documented in ED Course. Radiology: ordered and independent  interpretation performed. Decision-making details documented in ED Course. ECG/medicine tests: ordered and independent interpretation performed. Decision-making details documented in ED Course. Discussion of management or test interpretation with external provider(s): medicine  Risk OTC drugs. Prescription drug management. Parenteral controlled substances. Decision regarding hospitalization.   Iv ns. Continuous pulse ox and cardiac monitoring. Labs ordered/sent. Imaging ordered.   Differential diagnosis includes cva, encephalopathy, head injury, uti, etc. Dispo decision including potential need for admission considered - will get labs and imaging and reassess.   Reviewed nursing notes and prior charts for additional history. External reports reviewed. Additional history from: EMS.   Cardiac monitor: sinus rhythm, rate 80.  Labs reviewed/interpreted by me - wbc normal. Hgb 12. Chem normal except  k mildly low. Kcl po. Mg added to albs.   Xrays reviewed/interpreted by me - no fx.   CT reviewed/interpreted by me - no hem.   Recheck, more shaky/tremulous, pt confused. ?etoh withdrawal syndrome. Ciwa protocol/ativan.   Ivf.   Medicine consulted for admission.  CRITICAL CARE RE: acute alteration in mental status, weakness/confusion, hx etoh use disorder, alcohol withdrawal syndrom/ciwa.  Performed by: Suzi Roots Total critical care time: 45 minutes Critical care time was exclusive of separately billable procedures and treating other patients. Critical care was necessary to treat or prevent imminent  or life-threatening deterioration. Critical care was time spent personally by me on the following activities: development of treatment plan with patient and/or surrogate as well as nursing, discussions with consultants, evaluation of patient's response to treatment, examination of patient, obtaining history from patient or surrogate, ordering and performing treatments and interventions, ordering and review of laboratory studies, ordering and review of radiographic studies, pulse oximetry and re-evaluation of patient's condition.            Final Clinical Impression(s) / ED Diagnoses Final diagnoses:  None    Rx / DC Orders ED Discharge Orders     None           Cathren Laine, MD 03/14/23 1443

## 2023-03-14 NOTE — ED Triage Notes (Signed)
PT BIB [facility] for AMS per familt. LSW by family on Thursday. Has had 6 falls in the last month. Baseline is A&OX4 and ambulatory but now he can not walk and he is A&OX2. Pt drinks everyday per family but has not had drink since last night. Stroke screen neg. Has left knee pain.

## 2023-03-14 NOTE — ED Notes (Signed)
CIWA increased to a 3.  Dr. Denton Lank made aware via secure chat.

## 2023-03-15 ENCOUNTER — Encounter (HOSPITAL_COMMUNITY): Payer: Self-pay | Admitting: Student

## 2023-03-15 ENCOUNTER — Inpatient Hospital Stay (HOSPITAL_COMMUNITY): Payer: Medicare Other

## 2023-03-15 DIAGNOSIS — R569 Unspecified convulsions: Secondary | ICD-10-CM | POA: Diagnosis not present

## 2023-03-15 DIAGNOSIS — R4182 Altered mental status, unspecified: Secondary | ICD-10-CM | POA: Diagnosis not present

## 2023-03-15 DIAGNOSIS — F10931 Alcohol use, unspecified with withdrawal delirium: Secondary | ICD-10-CM | POA: Diagnosis not present

## 2023-03-15 LAB — CBC WITH DIFFERENTIAL/PLATELET
Abs Immature Granulocytes: 0.01 10*3/uL (ref 0.00–0.07)
Basophils Absolute: 0 10*3/uL (ref 0.0–0.1)
Basophils Relative: 1 %
Eosinophils Absolute: 0 10*3/uL (ref 0.0–0.5)
Eosinophils Relative: 1 %
HCT: 31.7 % — ABNORMAL LOW (ref 39.0–52.0)
Hemoglobin: 11 g/dL — ABNORMAL LOW (ref 13.0–17.0)
Immature Granulocytes: 0 %
Lymphocytes Relative: 26 %
Lymphs Abs: 0.9 10*3/uL (ref 0.7–4.0)
MCH: 33.7 pg (ref 26.0–34.0)
MCHC: 34.7 g/dL (ref 30.0–36.0)
MCV: 97.2 fL (ref 80.0–100.0)
Monocytes Absolute: 0.7 10*3/uL (ref 0.1–1.0)
Monocytes Relative: 20 %
Neutro Abs: 1.8 10*3/uL (ref 1.7–7.7)
Neutrophils Relative %: 52 %
Platelets: 115 10*3/uL — ABNORMAL LOW (ref 150–400)
RBC: 3.26 MIL/uL — ABNORMAL LOW (ref 4.22–5.81)
RDW: 13 % (ref 11.5–15.5)
WBC: 3.4 10*3/uL — ABNORMAL LOW (ref 4.0–10.5)
nRBC: 0 % (ref 0.0–0.2)

## 2023-03-15 LAB — COMPREHENSIVE METABOLIC PANEL
ALT: 22 U/L (ref 0–44)
AST: 49 U/L — ABNORMAL HIGH (ref 15–41)
Albumin: 3.4 g/dL — ABNORMAL LOW (ref 3.5–5.0)
Alkaline Phosphatase: 37 U/L — ABNORMAL LOW (ref 38–126)
Anion gap: 17 — ABNORMAL HIGH (ref 5–15)
BUN: 13 mg/dL (ref 8–23)
CO2: 20 mmol/L — ABNORMAL LOW (ref 22–32)
Calcium: 9.6 mg/dL (ref 8.9–10.3)
Chloride: 102 mmol/L (ref 98–111)
Creatinine, Ser: 1.01 mg/dL (ref 0.61–1.24)
GFR, Estimated: >60 mL/min (ref >60)
Glucose, Bld: 75 mg/dL (ref 70–99)
Potassium: 3.1 mmol/L — ABNORMAL LOW (ref 3.5–5.1)
Sodium: 139 mmol/L (ref 135–145)
Total Bilirubin: 2.6 mg/dL — ABNORMAL HIGH (ref <1.2)
Total Protein: 7.2 g/dL (ref 6.5–8.1)

## 2023-03-15 LAB — TECHNOLOGIST SMEAR REVIEW: Plt Morphology: UNDETERMINED

## 2023-03-15 LAB — HIV ANTIBODY (ROUTINE TESTING W REFLEX): HIV Screen 4th Generation wRfx: NONREACTIVE

## 2023-03-15 LAB — MAGNESIUM: Magnesium: 1.8 mg/dL (ref 1.7–2.4)

## 2023-03-15 LAB — PHOSPHORUS: Phosphorus: 3.6 mg/dL (ref 2.5–4.6)

## 2023-03-15 MED ORDER — THIAMINE HCL 100 MG/ML IJ SOLN: 100.0000 mg | Freq: Every day | INTRAMUSCULAR | Status: DC

## 2023-03-15 MED ORDER — HEPARIN SODIUM (PORCINE) 5000 UNIT/ML IJ SOLN
5000.0000 [IU] | Freq: Three times a day (TID) | INTRAMUSCULAR | Status: DC
  Administered 2023-03-15 – 2023-03-19 (×12): 5000 [IU] via SUBCUTANEOUS
  Filled 2023-03-15 (×12): qty 1

## 2023-03-15 MED ORDER — KCL IN DEXTROSE-NACL 20-5-0.45 MEQ/L-%-% IV SOLN
INTRAVENOUS | Status: DC
  Filled 2023-03-15 (×2): qty 1000

## 2023-03-15 MED ORDER — POTASSIUM CHLORIDE CRYS ER 20 MEQ PO TBCR
40.0000 meq | EXTENDED_RELEASE_TABLET | Freq: Once | ORAL | Status: AC
  Administered 2023-03-15: 40 meq via ORAL
  Filled 2023-03-15: qty 2

## 2023-03-15 MED ORDER — CHLORDIAZEPOXIDE HCL 5 MG PO CAPS
15.0000 mg | ORAL_CAPSULE | Freq: Three times a day (TID) | ORAL | Status: DC
  Administered 2023-03-15 (×2): 15 mg via ORAL
  Filled 2023-03-15 (×2): qty 3

## 2023-03-15 MED ORDER — MAGNESIUM SULFATE 2 GM/50ML IV SOLN
2.0000 g | Freq: Once | INTRAVENOUS | Status: AC
  Administered 2023-03-15: 2 g via INTRAVENOUS
  Filled 2023-03-15: qty 50

## 2023-03-15 MED ORDER — ASPIRIN 81 MG PO CHEW
81.0000 mg | CHEWABLE_TABLET | Freq: Every day | ORAL | Status: DC
  Administered 2023-03-15 – 2023-03-19 (×5): 81 mg via ORAL
  Filled 2023-03-15 (×5): qty 1

## 2023-03-15 MED ORDER — PANTOPRAZOLE SODIUM 40 MG IV SOLR
40.0000 mg | Freq: Every day | INTRAVENOUS | Status: DC
  Administered 2023-03-15 – 2023-03-17 (×3): 40 mg via INTRAVENOUS
  Filled 2023-03-15 (×3): qty 10

## 2023-03-15 MED ORDER — THIAMINE HCL 100 MG/ML IJ SOLN
500.0000 mg | Freq: Every day | INTRAMUSCULAR | Status: AC
Start: 2023-03-15 — End: 2023-03-18
  Administered 2023-03-15 – 2023-03-17 (×3): 500 mg via INTRAVENOUS
  Filled 2023-03-15 (×3): qty 6

## 2023-03-15 NOTE — Procedures (Signed)
Patient Name: KASEAN DENHERDER  MRN: [id number]  Epilepsy Attending: Charlsie Quest  Referring Physician/Provider: Steffanie Rainwater, MD  Date: 03/15/2023 Duration: 23.48 mins  Patient history: 67 y.o. male with medical history significant of alcohol use disorder, HTN and vitamin D deficiency admitted for alcohol withdrawal. EEG to evaluate for seizure  Level of alertness: Awake  AEDs during EEG study: Chlordiazepoxide  Technical aspects: This EEG study was done with scalp electrodes positioned according to the 10-20 International system of electrode placement. Electrical activity was reviewed with band pass filter of 1-70Hz , sensitivity of 7 uV/mm, display speed of 21mm/sec with a 60Hz  notched filter applied as appropriate. EEG data were recorded continuously and digitally stored.  Video monitoring was available and reviewed as appropriate.  Description: The posterior dominant rhythm consists of 9-10 Hz activity of moderate voltage (25-35 uV) seen predominantly in posterior head regions, symmetric and reactive to eye opening and eye closing. Hyperventilation and photic stimulation were not performed.     IMPRESSION: This study is within normal limits. No seizures or epileptiform discharges were seen throughout the recording.  A normal interictal EEG does not exclude the diagnosis of epilepsy.  Atira Borello Annabelle Harman

## 2023-03-15 NOTE — Evaluation (Signed)
Clinical/Bedside Swallow Evaluation Patient Details  Name: Steve Kelly MRN: [id number] Date of Birth: 1955/06/24  Today's Date: 03/15/2023 Time: SLP Start Time (ACUTE ONLY): 5784 SLP Stop Time (ACUTE ONLY): 1015 SLP Time Calculation (min) (ACUTE ONLY): 17 min  Past Medical History:  Past Medical History:  Diagnosis Date   Alcohol abuse    Heart murmur on physical examination    Hypertension    Tremor    Vitamin D deficiency    Past Surgical History: History reviewed. No pertinent surgical history. HPI:  Steve Kelly is a 67 y.o. male with medical history significant of alcohol use disorder, HTN and vitamin D deficiency who presents to the ED with altered mental status.  According to family, patient or his at baseline 3 days ago. Today, he appeared weak and confused with difficulty ambulating. He has had multiple falls in the last month. During my evaluation, patient in mittens and still disoriented. Speech is occasionally incomprehensible.  Marland Kitchen He reports drinking a pint of liquor a day and has been drinking since he was 67 years old. He reports his last drink was yesterday. He reports some neck pain but denies any chest pain, abdominal pain, fever or headache. ST consulted for swallow evaluation.    Assessment / Plan / Recommendation  Clinical Impression  Pt seen for clinical swallowing evaluation with altered mentation/cognition impairing swallow function with min verbal/tactile cues required during intake d/t impulsivity noted during liquid consumption with potential for increased aspiration risk d/t inattention vs functional swallow deficit.  Pt consumed thin via straw, puree and soft solids with min impaired mastication d/t edentulous state.  Current diet of thin liquids/soft consistency recommended to continue with FULL supervision with meals with A required to safely consume current diet with general swallowing precautions in place during all po intake.  ST will f/u briefly for  diet tolerance/education in acute setting.  Thank you for this consult. SLP Visit Diagnosis: Dysphagia, unspecified (R13.10)    Aspiration Risk  Mild aspiration risk    Diet Recommendation   Thin;Age appropriate regular;Other (Comment) (soft; pt preferred)  Medication Administration: Whole meds with puree    Other  Recommendations Oral Care Recommendations: Oral care BID;Staff/trained caregiver to provide oral care    Recommendations for follow up therapy are one component of a multi-disciplinary discharge planning process, led by the attending physician.  Recommendations may be updated based on patient status, additional functional criteria and insurance authorization.  Follow up Recommendations Follow physician's recommendations for discharge plan and follow up therapies      Assistance Recommended at Discharge  TBD  Functional Status Assessment Patient has had a recent decline in their functional status and demonstrates the ability to make significant improvements in function in a reasonable and predictable amount of time.  Frequency and Duration min 1 x/week  1 week       Prognosis Prognosis for improved oropharyngeal function: Good      Swallow Study   General Date of Onset: 03/14/23 HPI: Steve Kelly is a 67 y.o. male with medical history significant of alcohol use disorder, HTN and vitamin D deficiency who presents to the ED with altered mental status.  According to family, patient or his at baseline 3 days ago. Today, he appeared weak and confused with difficulty ambulating. He has had multiple falls in the last month. During my evaluation, patient in mittens and still disoriented. Speech is occasionally incomprehensible.  Marland Kitchen He reports drinking a pint of liquor a day and  has been drinking since he was 67 years old. He reports his last drink was yesterday. He reports some neck pain but denies any chest pain, abdominal pain, fever or headache. ST consulted for swallow  evaluation. Type of Study: Bedside Swallow Evaluation Previous Swallow Assessment: n/a Diet Prior to this Study: Thin liquids (Level 0);Regular (soft) Temperature Spikes Noted: No Respiratory Status: Room air History of Recent Intubation: No Behavior/Cognition: Confused;Impulsive;Distractible;Alert Oral Cavity Assessment: Dry Oral Care Completed by SLP: Recent completion by staff Oral Cavity - Dentition: Edentulous Self-Feeding Abilities: Needs assist Patient Positioning: Upright in bed Baseline Vocal Quality: Low vocal intensity;Other (comment) (min intelligible speech; garbled; improved with cues) Volitional Cough: Cognitively unable to elicit Volitional Swallow: Unable to elicit    Oral/Motor/Sensory Function Overall Oral Motor/Sensory Function: Generalized oral weakness   Ice Chips Ice chips: Impaired Presentation: Spoon Oral Phase Functional Implications: Prolonged oral transit Pharyngeal Phase Impairments: Suspected delayed Swallow   Thin Liquid Thin Liquid: Impaired Presentation: Cup;Straw;Spoon Pharyngeal  Phase Impairments: Suspected delayed Swallow    Nectar Thick Nectar Thick Liquid: Not tested   Honey Thick Honey Thick Liquid: Not tested   Puree Puree: Impaired Presentation: Spoon Oral Phase Impairments: Reduced lingual movement/coordination Oral Phase Functional Implications: Oral holding Pharyngeal Phase Impairments: Suspected delayed Swallow   Solid     Solid: Impaired Presentation: Spoon Oral Phase Impairments: Impaired mastication;Reduced lingual movement/coordination Oral Phase Functional Implications: Impaired mastication Pharyngeal Phase Impairments: Suspected delayed Swallow      Steve Kelly,M.S.,CCC-SLP 03/15/2023,11:14 AM

## 2023-03-15 NOTE — Progress Notes (Signed)
EEG complete - results pending 

## 2023-03-15 NOTE — TOC Initial Note (Signed)
Transition of Care [facility]) - Initial/Assessment Note    Patient Details  Name: Steve Kelly MRN: [id number] Date of Birth: 04-16-1956  Transition of Care [facility]) CM/SW Contact:    Mearl Latin, LCSW Phone Number: 03/15/2023, 3:24 PM  Clinical Narrative:                 CSW following for ETOH consult. Patient currently not appropriate for assessment.     Barriers to Discharge: Continued Medical Work up   Patient Goals and CMS Choice            Expected Discharge Plan and Services In-house Referral: Clinical Social Work                                            Prior Living Arrangements/Services   Lives with:: Adult Children Patient language and need for interpreter reviewed:: Yes        Need for Family Participation in Patient Care: Yes (Comment) Care giver support system in place?: Yes (comment)   Criminal Activity/Legal Involvement Pertinent to Current Situation/Hospitalization: No - Comment as needed  Activities of Daily Living   ADL Screening (condition at time of admission) Independently performs ADLs?: No Does the patient have a NEW difficulty with bathing/dressing/toileting/self-feeding that is expected to last >3 days?: No Does the patient have a NEW difficulty with getting in/out of bed, walking, or climbing stairs that is expected to last >3 days?: No Does the patient have a NEW difficulty with communication that is expected to last >3 days?: No Is the patient deaf or have difficulty hearing?: No Does the patient have difficulty seeing, even when wearing glasses/contacts?: No Does the patient have difficulty concentrating, remembering, or making decisions?: Yes  Permission Sought/Granted                  Emotional Assessment       Orientation: : Oriented to Self, Oriented to Place Alcohol / Substance Use: Alcohol Use Psych Involvement: No (comment)  Admission diagnosis:  Alcohol withdrawal (HCC) [F10.939] Hypokalemia  [E87.6] Confusion [R41.0] Elevated blood pressure reading [R03.0] Thrombocytopenia (HCC) [D69.6] Generalized weakness [R53.1] Alcohol withdrawal syndrome with complication (HCC) [F10.939] History of alcohol use disorder [Z87.898] Acute alteration in mental status [R41.82] Patient Active Problem List   Diagnosis Date Noted   Alcohol withdrawal (HCC) 03/14/2023   Acute alteration in mental status 03/14/2023   Generalized weakness 03/14/2023   History of alcohol use disorder 03/14/2023   Thrombocytopenia (HCC) 03/14/2023   Hypokalemia 03/14/2023   Alcohol abuse 10/09/2022   Heart murmur on physical examination 10/09/2022   Hypertension 10/09/2022   Tremor 10/09/2022   Vitamin D deficiency 10/09/2022   PCP:  Ellis Parents, FNP Pharmacy:   [facility] [phone] - [place], [place] - [address] AT [facility] [address] [place] [address] Phone: [phone] Fax: [phone]     Social Determinants of Health (SDOH) Social History: SDOH Screenings   Food Insecurity: Patient Unable To Answer (03/15/2023)  Housing: Patient Unable To Answer (03/15/2023)  Transportation Needs: Patient Unable To Answer (03/15/2023)  Utilities: Patient Unable To Answer (03/15/2023)  Tobacco Use: Low Risk  (03/14/2023)   SDOH Interventions:     Readmission Risk Interventions     No data to display

## 2023-03-15 NOTE — Progress Notes (Addendum)
PROGRESS NOTE                                                                                                                                                                                                             Patient Demographics:    Steve Kelly, is a 67 y.o. male, DOB - [birth year], MVH:[id number]  Outpatient Primary MD for the patient is Steve Parents, FNP    LOS - 1  Admit date - 03/14/2023    Chief Complaint  Patient presents with   Altered Mental Status       Brief Narrative (HPI from H&P)    67 y.o. male with medical history significant of alcohol use disorder, HTN and vitamin D deficiency who presents to the ED with altered mental status.  According to family, patient or his at baseline 3 days ago. Today, he appeared weak and confused with difficulty ambulating. He has had multiple falls in the last month. During my evaluation, patient in mittens and still disoriented. Speech is occasionally incomprehensible.  Marland Kitchen He reports drinking a pint of liquor a day and has been drinking since he was 67 years old. He reports his last drink was yesterday, in the ER he was diagnosed with severe DTs and admitted to the hospital.   Subjective:    Steve Kelly today in bed appears to be in no distress, somnolent but arousable, denies any headache or chest pain.   Assessment  & Plan :    Severe acute metabolic encephalopathy due to DTs from alcohol withdrawal. He has longstanding history of heavy alcohol abuse, has had multiple falls recently at home, came to the ER with altered mental status and rapidly went into severe DTs, questionable seizure-like activity in the ER, head CT and CT C-spine nonacute.  No fever or leukocytosis, has been placed on Librium along with CIWA protocol, high-dose IV thiamine, D5 drip for 24 hours, low-dose aspirin, PT, speech eval.  Check EEG.  Ackley counseled to abstain from alcohol  monitor.  Thrombocytopenia with fatty liver on ultrasound.  Likely due to #1 above.  Monitor.  Hypertension.  Beta-blocker and as needed IV Lopressor.  Hypokalemia, hypomagnesemia, vitamin D deficiency.  Replaced.  AKI.  Hydrate and monitor.  Follow bladder scans.  Mild rhabdomyolysis.  Hydrate.      Condition - Extremely Guarded  Family Communication  :    Provided contact  Ms. Steve Kelly [phone] as the contact person is the wrong contact person, Ms. Steve Kelly responded to the given phone number and claims she does not know Mr. Steve Kelly.  He was called 03/15/2023 at 7:25 AM  Code Status : Full code  Consults  : None  PUD Prophylaxis : PPI   Procedures  :     CT head & C spine - 1. No acute intracranial pathology. 2. No acute fracture or traumatic malalignment of the cervical spine. 3. Advanced multilevel degenerative changes as above  [place] -  Hepatic steatosis with focal fatty sparing.       Disposition Plan  :    Status is: Inpatient  DVT Prophylaxis  :  Heparin    Lab Results  Component Value Date   PLT 100 (L) 03/14/2023    Diet :  Diet Order             DIET SOFT Fluid consistency: Thin  Diet effective now                    Inpatient Medications  Scheduled Meds:  aspirin  81 mg Oral Daily   atenolol  50 mg Oral Daily   chlordiazePOXIDE  15 mg Oral TID   folic acid  1 mg Oral Daily   heparin injection (subcutaneous)  5,000 Units Subcutaneous Q8H   multivitamin with minerals  1 tablet Oral Daily   pantoprazole (PROTONIX) IV  40 mg Intravenous Q24H   [START ON 03/18/2023] thiamine (VITAMIN B1) injection  100 mg Intravenous Daily   thiamine  500 mg Intravenous Daily   Vitamin D (Ergocalciferol)  50,000 Units Oral Q7 days   Continuous Infusions:  dextrose 5 % and 0.45 % NaCl with KCl 20 mEq/L     magnesium sulfate bolus IVPB     PRN Meds:.acetaminophen **OR** acetaminophen, labetalol, loperamide, LORazepam **OR** LORazepam, [DISCONTINUED]  ondansetron **OR** ondansetron (ZOFRAN) IV, senna-docusate  Antibiotics  :    Anti-infectives (From admission, onward)    None         Objective:   Vitals:   03/14/23 2200 03/15/23 0000 03/15/23 0200 03/15/23 0400  BP:  (!) 123/90  127/86  Pulse: 91 100 94 96  Resp: 17 15 14 15   Temp:  98.5 F (36.9 C)  98 F (36.7 C)  TempSrc:  Axillary  Axillary  SpO2: 97% 95% 94% 95%  Weight:      Height:        Wt Readings from Last 3 Encounters:  03/14/23 68 kg     Intake/Output Summary (Last 24 hours) at 03/15/2023 0735 Last data filed at 03/14/2023 1931 Gross per 24 hour  Intake 50 ml  Output --  Net 50 ml     Physical Exam  Somnolent and somewhat confused, doing all 4 extremities to command, supple neck [place].AT,PERRAL Supple Neck, No JVD,   Symmetrical Chest wall movement, Good air movement bilaterally, CTAB RRR,No Gallops,Rubs or new Murmurs,  +ve B.Sounds, Abd Soft, No tenderness,   No Cyanosis, Clubbing or edema      Data Review:    Recent Labs  Lab 03/14/23 1136  WBC 4.5  HGB 12.1*  HCT 35.4*  PLT 100*  MCV 99.7  MCH 34.1*  MCHC 34.2  RDW 13.1    Recent Labs  Lab 03/14/23 1130 03/14/23 1136 03/14/23 1503  NA  --  139  --   K  --  3.2*  --   CL  --  99  --   CO2  --  16*  --   ANIONGAP  --  24*  --   GLUCOSE  --  71  --   BUN  --  23  --   CREATININE  --  1.40*  --   AST  --  67*  --   ALT  --  28  --   ALKPHOS  --  40  --   BILITOT  --  2.7*  --   ALBUMIN  --  4.0  --   INR  --   --  1.1  AMMONIA 26  --   --   MG  --   --  1.5*  CALCIUM  --  10.1  --       Recent Labs  Lab 03/14/23 1130 03/14/23 1136 03/14/23 1503  INR  --   --  1.1  AMMONIA 26  --   --   MG  --   --  1.5*  CALCIUM  --  10.1  --     --------------------------------------------------------------------------------------------------------------- No results found for: "CHOL", "HDL", "LDLCALC", "LDLDIRECT", "TRIG", "CHOLHDL"  No results found for:  "HGBA1C" No results for input(s): "TSH", "T4TOTAL", "FREET4", "T3FREE", "THYROIDAB" in the last 72 hours. Recent Labs    03/14/23 1503  VITAMINB12 995*  FOLATE 12.0   ------------------------------------------------------------------------------------------------------------------ Cardiac Enzymes No results for input(s): "CKMB", "TROPONINI", "MYOGLOBIN" in the last 168 hours.  Invalid input(s): "CK"  Micro Results No results found for this or any previous visit (from the past 240 hour(s)).  Radiology Reports US Abdomen Limited RUQ (LIVER/GB)  Result Date: 03/14/2023 CLINICAL DATA:  Thrombocytopenia, elevated LFTs EXAM: ULTRASOUND ABDOMEN LIMITED RIGHT UPPER QUADRANT COMPARISON:  None Available. FINDINGS: Gallbladder: No gallstones or wall thickening visualized. No sonographic Murphy sign noted by sonographer. Common bile duct: Diameter: 4 mm Liver: Hyperechoic hepatic parenchyma, suggesting hepatic steatosis, with focal fatty sparing along the gallbladder fossa. Portal vein is patent on color Doppler imaging with normal direction of blood flow towards the liver. Other: None. IMPRESSION: Hepatic steatosis with focal fatty sparing. Electronically Signed   By: Charline Bills M.D.   On: 03/14/2023 19:01   CT Head Wo Contrast  Result Date: 03/14/2023 CLINICAL DATA:  Head trauma, pain EXAM: CT HEAD WITHOUT CONTRAST CT CERVICAL SPINE WITHOUT CONTRAST TECHNIQUE: Multidetector CT imaging of the head and cervical spine was performed following the standard protocol without intravenous contrast. Multiplanar CT image reconstructions of the cervical spine were also generated. RADIATION DOSE REDUCTION: This exam was performed according to the departmental dose-optimization program which includes automated exposure control, adjustment of the mA and/or kV according to patient size and/or use of iterative reconstruction technique. COMPARISON:  CT head and cervical spine 01/16/2017 FINDINGS: CT HEAD FINDINGS  Brain: There is no acute intracranial hemorrhage, extra-axial fluid collection, or acute infarct. Parenchymal volume is normal. The ventricles are normal in size. Gray-white differentiation is preserved. Hypodensity in the supratentorial white matter likely reflects underlying chronic small-vessel ischemic change. 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 The pituitary and suprasellar region are normal. There is no mass lesion. There is no mass effect or midline shift. Vascular: No hyperdense vessel or unexpected calcification. Skull: Normal. Negative for fracture or focal lesion. Sinuses/Orbits: The imaged paranasal sinuses are clear. The globes and orbits are unremarkable. Other: The mastoid air cells and middle ear cavities are clear. CT CERVICAL SPINE FINDINGS Alignment: There  is mild degenerative anterolisthesis of C2 on C3. There is no evidence of traumatic malalignment. Skull base and vertebrae: Skull base alignment is maintained. Vertebral body heights are preserved. There is no evidence of acute fracture. There is no suspicious osseous lesion. Soft tissues and spinal canal: No prevertebral fluid or swelling. No visible canal hematoma. Disc levels: There is severe multilevel disc space narrowing and degenerative endplate change with prominent anterior osteophytes throughout the cervical spine. There is osseous fusion across the C5-C6 disc space. There is severe left facet arthropathy at C2-C3. Upper chest: The imaged lung apices are clear. Other: None. IMPRESSION: 1. No acute intracranial pathology. 2. No acute fracture or traumatic malalignment of the cervical spine. 3. Advanced multilevel degenerative changes as above. Electronically Signed   By: Lesia Hausen M.D.   On: 03/14/2023 12:58   CT Cervical Spine Wo Contrast  Result Date: 03/14/2023 CLINICAL DATA:  Head trauma, pain EXAM: CT HEAD WITHOUT CONTRAST CT CERVICAL SPINE WITHOUT CONTRAST TECHNIQUE: Multidetector CT imaging  of the head and cervical spine was performed following the standard protocol without intravenous contrast. Multiplanar CT image reconstructions of the cervical spine were also generated. RADIATION DOSE REDUCTION: This exam was performed according to the departmental dose-optimization program which includes automated exposure control, adjustment of the mA and/or kV according to patient size and/or use of iterative reconstruction technique. COMPARISON:  CT head and cervical spine 01/16/2017 FINDINGS: CT HEAD FINDINGS Brain: There is no acute intracranial hemorrhage, extra-axial fluid collection, or acute infarct. Parenchymal volume is normal. The ventricles are normal in size. Gray-white differentiation is preserved. Hypodensity in the supratentorial white matter likely reflects underlying chronic small-vessel ischemic change. 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 The pituitary and suprasellar region are normal. There is no mass lesion. There is no mass effect or midline shift. Vascular: No hyperdense vessel or unexpected calcification. Skull: Normal. Negative for fracture or focal lesion. Sinuses/Orbits: The imaged paranasal sinuses are clear. The globes and orbits are unremarkable. Other: The mastoid air cells and middle ear cavities are clear. CT CERVICAL SPINE FINDINGS Alignment: There is mild degenerative anterolisthesis of C2 on C3. There is no evidence of traumatic malalignment. Skull base and vertebrae: Skull base alignment is maintained. Vertebral body heights are preserved. There is no evidence of acute fracture. There is no suspicious osseous lesion. Soft tissues and spinal canal: No prevertebral fluid or swelling. No visible canal hematoma. Disc levels: There is severe multilevel disc space narrowing and degenerative endplate change with prominent anterior osteophytes throughout the cervical spine. There is osseous fusion across the C5-C6 disc space. There is severe left facet  arthropathy at C2-C3. Upper chest: The imaged lung apices are clear. Other: None. IMPRESSION: 1. No acute intracranial pathology. 2. No acute fracture or traumatic malalignment of the cervical spine. 3. Advanced multilevel degenerative changes as above. Electronically Signed   By: Lesia Hausen M.D.   On: 03/14/2023 12:58   DG Knee Complete 4 Views Left  Result Date: 03/14/2023 CLINICAL DATA:  Fall, pain EXAM: LEFT KNEE - COMPLETE 4+ VIEW COMPARISON:  None Available. FINDINGS: There is no acute fracture or dislocation. Knee alignment is normal. There is moderate medial compartment joint space narrowing with associated subchondral sclerosis and osteophytosis. There is no erosive change. There is no significant suprapatellar effusion, though positioning is suboptimal on the lateral projection. IMPRESSION: No acute finding. Moderate medial compartment joint space narrowing. Electronically Signed   By: Lesia Hausen M.D.   On:  03/14/2023 11:56      Signature  -   Susa Raring M.D on 03/15/2023 at 7:35 AM   -  To page go to [email or web address]

## 2023-03-15 NOTE — Evaluation (Signed)
Physical Therapy Evaluation Patient Details Name: Steve Kelly MRN: [id number] DOB: Feb 10, 1956 Today's Date: 03/15/2023  History of Present Illness  67 y.o. male who presents to the ED with altered mental status. +alcohol withdrawal; AKI; ?seizure 11/03; head CT no acute changes;   PMH significant of alcohol use disorder, HTN and vitamin D deficiency  Clinical Impression   Pt admitted with above diagnosis. Prior to admission, pt lives with son and dtr-in-law in a one level home with 2 steps to enter and no rail. He is typically independent with mobility, ADLs, and IADLs. Currently he is very unsteady in standing (mod assist) and tremulous in standing. Only able to take very small steps to his left toward [facility]. Returned to bed for safety.  Pt's current functional limitations due to the deficits listed below (see PT Problem List). Pt will benefit from acute skilled PT to increase their independence and safety with mobility to allow discharge.           If plan is discharge home, recommend the following:     Can travel by private vehicle        Equipment Recommendations Other (comment) (TBD)  Recommendations for Other Services  OT consult    Functional Status Assessment Patient has had a recent decline in their functional status and demonstrates the ability to make significant improvements in function in a reasonable and predictable amount of time.     Precautions / Restrictions Precautions Precautions: Fall      Mobility  Bed Mobility Overal bed mobility: Needs Assistance Bed Mobility: Supine to Sit, Sit to Supine     Supine to sit: Contact guard, HOB elevated Sit to supine: Contact guard assist   General bed mobility comments: multiple cues to continue to advance until his feet reached the floor    Transfers Overall transfer level: Needs assistance Equipment used: None Transfers: Sit to/from Stand Sit to Stand: Mod assist           General transfer comment:  became tremulous as starting to stand; difficulty side-stepping toward [facility] due to tremors    Ambulation/Gait               General Gait Details: unable  Stairs            Wheelchair Mobility     Tilt Bed    Modified Rankin (Stroke Patients Only)       Balance Overall balance assessment: Needs assistance Sitting-balance support: No upper extremity supported, Feet supported Sitting balance-Leahy Scale: Fair     Standing balance support: No upper extremity supported Standing balance-Leahy Scale: Poor                               Pertinent Vitals/Pain Pain Assessment Pain Assessment: No/denies pain    Home Living Family/patient expects to be discharged to:: Private residence Living Arrangements: Children (son and dtr-in-law) Available Help at Discharge: Family;Available PRN/intermittently Type of Home: House Home Access: Stairs to enter Entrance Stairs-Rails: None Entrance Stairs-Number of Steps: 2   Home Layout: One level Home Equipment: None      Prior Function Prior Level of Function : Independent/Modified Independent                     Extremity/Trunk Assessment   Upper Extremity Assessment Upper Extremity Assessment: Defer to OT evaluation    Lower Extremity Assessment Lower Extremity Assessment: Overall WFL for tasks assessed  Cervical / Trunk Assessment Cervical / Trunk Assessment: Normal  Communication   Communication Communication: Difficulty communicating thoughts/reduced clarity of speech (mumbles) Cueing Techniques: Verbal cues;Tactile cues  Cognition Arousal: Lethargic Behavior During Therapy: Restless Overall Cognitive Status: Impaired/Different from baseline Area of Impairment: Orientation, Attention, Memory, Following commands, Safety/judgement, Awareness, Problem solving                 Orientation Level: Disoriented to, Time, Situation Current Attention Level: Sustained Memory: Decreased  short-term memory (decr long-term memory (thinks his son is his brother)) Following Commands: Follows one step commands inconsistently, Follows one step commands with increased time Safety/Judgement: Decreased awareness of safety, Decreased awareness of deficits Awareness: Intellectual Problem Solving: Slow processing, Decreased initiation, Difficulty sequencing, Requires verbal cues General Comments: Patient oriented to place and month, not day of week or situation.        General Comments General comments (skin integrity, edema, etc.): Son and dtr-in-law present however pt thought they were his brother and sister. Even after told who they were, he forgot and called them by incorrect names    Exercises     Assessment/Plan    PT Assessment Patient needs continued PT services  PT Problem List Decreased activity tolerance;Decreased balance;Decreased mobility;Decreased coordination;Decreased cognition;Decreased knowledge of use of DME;Decreased safety awareness;Decreased knowledge of precautions       PT Treatment Interventions DME instruction;Gait training;Stair training;Functional mobility training;Therapeutic activities;Therapeutic exercise;Balance training;Cognitive remediation;Patient/family education    PT Goals (Current goals can be found in the Care Plan section)  Acute Rehab PT Goals Patient Stated Goal: unable to state PT Goal Formulation: With patient/family Time For Goal Achievement: 03/29/23 Potential to Achieve Goals: Good    Frequency Min 1X/week     Co-evaluation               AM-PAC PT "6 Clicks" Mobility  Outcome Measure Help needed turning from your back to your side while in a flat bed without using bedrails?: None Help needed moving from lying on your back to sitting on the side of a flat bed without using bedrails?: A Little Help needed moving to and from a bed to a chair (including a wheelchair)?: Total Help needed standing up from a chair using  your arms (e.g., wheelchair or bedside chair)?: Total Help needed to walk in hospital room?: Total Help needed climbing 3-5 steps with a railing? : Total 6 Click Score: 11    End of Session Equipment Utilized During Treatment: Gait belt Activity Tolerance: Treatment limited secondary to medical complications (Comment) (tremors) Patient left: in bed;with call bell/phone within reach;with family/visitor present (RN ok'd removing mitts so he can eat) Nurse Communication: Mobility status;Other (comment) (family present) PT Visit Diagnosis: Unsteadiness on feet (R26.81);Other abnormalities of gait and mobility (R26.89)    Time: 4782-9562 PT Time Calculation (min) (ACUTE ONLY): 21 min   Charges:   PT Evaluation $PT Eval Low Complexity: 1 Low   PT General Charges $$ ACUTE PT VISIT: 1 Visit          Steve Kelly, PT Acute Rehabilitation Services  Office [phone]   Steve Kelly 03/15/2023, 1:23 PM

## 2023-03-16 DIAGNOSIS — F10931 Alcohol use, unspecified with withdrawal delirium: Secondary | ICD-10-CM | POA: Diagnosis not present

## 2023-03-16 LAB — CBC WITH DIFFERENTIAL/PLATELET
Abs Immature Granulocytes: 0 10*3/uL (ref 0.00–0.07)
Basophils Absolute: 0 10*3/uL (ref 0.0–0.1)
Basophils Relative: 0 %
Eosinophils Absolute: 0 10*3/uL (ref 0.0–0.5)
Eosinophils Relative: 1 %
HCT: 31.4 % — ABNORMAL LOW (ref 39.0–52.0)
Hemoglobin: 10.9 g/dL — ABNORMAL LOW (ref 13.0–17.0)
Immature Granulocytes: 0 %
Lymphocytes Relative: 32 %
Lymphs Abs: 1.2 10*3/uL (ref 0.7–4.0)
MCH: 33.2 pg (ref 26.0–34.0)
MCHC: 34.7 g/dL (ref 30.0–36.0)
MCV: 95.7 fL (ref 80.0–100.0)
Monocytes Absolute: 0.8 10*3/uL (ref 0.1–1.0)
Monocytes Relative: 21 %
Neutro Abs: 1.7 10*3/uL (ref 1.7–7.7)
Neutrophils Relative %: 46 %
Platelets: 118 10*3/uL — ABNORMAL LOW (ref 150–400)
RBC: 3.28 MIL/uL — ABNORMAL LOW (ref 4.22–5.81)
RDW: 12.6 % (ref 11.5–15.5)
WBC: 3.7 10*3/uL — ABNORMAL LOW (ref 4.0–10.5)
nRBC: 0 % (ref 0.0–0.2)

## 2023-03-16 LAB — COMPREHENSIVE METABOLIC PANEL
ALT: 21 U/L (ref 0–44)
AST: 42 U/L — ABNORMAL HIGH (ref 15–41)
Albumin: 3.2 g/dL — ABNORMAL LOW (ref 3.5–5.0)
Alkaline Phosphatase: 37 U/L — ABNORMAL LOW (ref 38–126)
Anion gap: 10 (ref 5–15)
BUN: 13 mg/dL (ref 8–23)
CO2: 22 mmol/L (ref 22–32)
Calcium: 9.1 mg/dL (ref 8.9–10.3)
Chloride: 103 mmol/L (ref 98–111)
Creatinine, Ser: 0.92 mg/dL (ref 0.61–1.24)
GFR, Estimated: >60 mL/min (ref >60)
Glucose, Bld: 115 mg/dL — ABNORMAL HIGH (ref 70–99)
Potassium: 3.1 mmol/L — ABNORMAL LOW (ref 3.5–5.1)
Sodium: 135 mmol/L (ref 135–145)
Total Bilirubin: 1.5 mg/dL — ABNORMAL HIGH (ref <1.2)
Total Protein: 6.8 g/dL (ref 6.5–8.1)

## 2023-03-16 LAB — MAGNESIUM: Magnesium: 1.7 mg/dL (ref 1.7–2.4)

## 2023-03-16 LAB — PHOSPHORUS: Phosphorus: 2.9 mg/dL (ref 2.5–4.6)

## 2023-03-16 MED ORDER — KCL IN DEXTROSE-NACL 20-5-0.45 MEQ/L-%-% IV SOLN
INTRAVENOUS | Status: AC
  Filled 2023-03-16: qty 1000

## 2023-03-16 MED ORDER — POTASSIUM CHLORIDE CRYS ER 20 MEQ PO TBCR
40.0000 meq | EXTENDED_RELEASE_TABLET | Freq: Once | ORAL | Status: AC
  Administered 2023-03-16: 40 meq via ORAL
  Filled 2023-03-16: qty 2

## 2023-03-16 MED ORDER — CLONIDINE HCL 0.2 MG PO TABS
0.2000 mg | ORAL_TABLET | Freq: Three times a day (TID) | ORAL | Status: DC
  Administered 2023-03-16 – 2023-03-17 (×6): 0.2 mg via ORAL
  Filled 2023-03-16 (×7): qty 1

## 2023-03-16 MED ORDER — CHLORDIAZEPOXIDE HCL 5 MG PO CAPS
20.0000 mg | ORAL_CAPSULE | Freq: Three times a day (TID) | ORAL | Status: DC
  Administered 2023-03-16 – 2023-03-17 (×6): 20 mg via ORAL
  Filled 2023-03-16 (×6): qty 4

## 2023-03-16 MED ORDER — CLONIDINE HCL 0.2 MG PO TABS: 0.2000 mg | ORAL_TABLET | Freq: Four times a day (QID) | ORAL | Status: DC | PRN

## 2023-03-16 MED ORDER — LOPERAMIDE HCL 2 MG PO CAPS: 2.0000 mg | ORAL_CAPSULE | ORAL | Status: AC | PRN

## 2023-03-16 NOTE — Evaluation (Signed)
Occupational Therapy Evaluation Patient Details Name: Steve Kelly MRN: [id number] DOB: [birth year] Today's Date: 03/16/2023   History of Present Illness 67 y.o. male who presents to the ED with altered mental status. +alcohol withdrawal; AKI; ?seizure 11/03; head CT no acute changes;   PMH significant of alcohol use disorder, HTN and vitamin D deficiency   Clinical Impression   Pt c/o pain in L arm at IV site, and L lateral thigh, 6/10 at rest. Pt not aware of why he is in hospital, knows month/day, location. Pt lives at home with son/DIL, who can assist before/after work. Pt states PLOF independent. Pt currently set up for UB ADLs,  min A for LB ADLs, requires min-mod for transfers/balance with RW. Pt states he has no DME at home, does not use AD with mobility. Pt requires verbal cueing for safety and displays decreased awareness of deficits. Pt at this time due to lack of support in home environment and poor balance would benefit from postacute rehab, <3hrs/day to improve back to mod I with mobility/ADLs. If Pt has consistent home support, HH would be appropriate, with RW and tub bench for safety and to maintain independence. Pt will be seen acutely to maximize progress.       If plan is discharge home, recommend the following: A little help with walking and/or transfers;A little help with bathing/dressing/bathroom;Assistance with cooking/housework;Assist for transportation;Help with stairs or ramp for entrance    Functional Status Assessment  Patient has had a recent decline in their functional status and demonstrates the ability to make significant improvements in function in a reasonable and predictable amount of time.  Equipment Recommendations  Tub/shower bench;Other (comment) (RW)    Recommendations for Other Services       Precautions / Restrictions Precautions Precautions: Fall Restrictions Weight Bearing Restrictions: No      Mobility Bed Mobility Overal bed mobility:  Needs Assistance Bed Mobility: Supine to Sit, Sit to Supine     Supine to sit: Contact guard, HOB elevated Sit to supine: Contact guard assist   General bed mobility comments: cueing for scooting fully to EOB    Transfers Overall transfer level: Needs assistance Equipment used: Rolling walker (2 wheels) Transfers: Bed to chair/wheelchair/BSC, Sit to/from Stand Sit to Stand: Min assist Stand pivot transfers: Min assist   Step pivot transfers: Min assist     General transfer comment: min A for power for STS, able to take steps to recliner using RW, poor balance, unable to complete task without RW for support. Decreased safety awareness, verbal cueing for correct hand placement on RW      Balance Overall balance assessment: Needs assistance Sitting-balance support: No upper extremity supported, Feet supported Sitting balance-Leahy Scale: Fair Sitting balance - Comments: EOB ADLs   Standing balance support: Bilateral upper extremity supported, Reliant on assistive device for balance, During functional activity Standing balance-Leahy Scale: Poor Standing balance comment: poor balance without RW, improved with RW, able to stand for 1-2 minutes with min A for balance.                           ADL either performed or assessed with clinical judgement   ADL Overall ADL's : Needs assistance/impaired Eating/Feeding: Independent   Grooming: Set up;Sitting   Upper Body Bathing: Set up;Sitting   Lower Body Bathing: Minimal assistance;Sitting/lateral leans   Upper Body Dressing : Set up;Sitting   Lower Body Dressing: Minimal assistance;Sitting/lateral leans   Toilet Transfer: Minimal  assistance;BSC/3in1;Rolling walker (2 wheels)   Toileting- Clothing Manipulation and Hygiene: Minimal assistance         General ADL Comments: Pt set up/CGA for UB ADLs, min A for LB ADLs, able to take a few steps to recliner with RW, poor balance/safety awareness     Vision  Baseline Vision/History: 0 No visual deficits Ability to See in Adequate Light: 0 Adequate Patient Visual Report: No change from baseline       Perception         Praxis         Pertinent Vitals/Pain Pain Assessment Pain Assessment: 0-10 Pain Score: 6  Pain Location: L arm and leg Pain Descriptors / Indicators: Aching, Discomfort Pain Intervention(s): Monitored during session     Extremity/Trunk Assessment Upper Extremity Assessment Upper Extremity Assessment: Overall WFL for tasks assessed           Communication Communication Communication: Difficulty communicating thoughts/reduced clarity of speech   Cognition Arousal: Lethargic Behavior During Therapy: WFL for tasks assessed/performed Overall Cognitive Status: Impaired/Different from baseline Area of Impairment: Safety/judgement                         Safety/Judgement: Decreased awareness of safety, Decreased awareness of deficits     General Comments: Pt oriented to month/year, did not know reason he is in hospital. Pt displays decreased awareness of deficits and safety awareness. able to fully participate in therapy     General Comments       Exercises     Shoulder Instructions      Home Living Family/patient expects to be discharged to:: Private residence Living Arrangements: Children Available Help at Discharge: Family;Available PRN/intermittently Type of Home: House Home Access: Stairs to enter [employer] of Steps: 2 Entrance Stairs-Rails: None Home Layout: One level     Bathroom Shower/Tub: Tub/shower unit         Home Equipment: None   Additional Comments: Pt lives at home with son who is there all day, daughter in law works during the day      Prior Functioning/Environment Prior Level of Function : Independent/Modified Independent                        OT Problem List: Decreased strength;Decreased activity tolerance;Decreased range of  motion;Impaired balance (sitting and/or standing);Decreased safety awareness;Decreased cognition;Pain      OT Treatment/Interventions: Self-care/ADL training;Therapeutic exercise;Energy conservation;DME and/or AE instruction;Therapeutic activities;Balance training    OT Goals(Current goals can be found in the care plan section) Acute Rehab OT Goals Patient Stated Goal: to improve strength/balance OT Goal Formulation: With patient Time For Goal Achievement: 03/30/23 Potential to Achieve Goals: Good  OT Frequency: Min 1X/week    Co-evaluation              AM-PAC OT "6 Clicks" Daily Activity     Outcome Measure Help from another person eating meals?: None Help from another person taking care of personal grooming?: A Little Help from another person toileting, which includes using toliet, bedpan, or urinal?: A Little Help from another person bathing (including washing, rinsing, drying)?: A Little Help from another person to put on and taking off regular upper body clothing?: A Little Help from another person to put on and taking off regular lower body clothing?: A Little 6 Click Score: 19   End of Session Equipment Utilized During Treatment: Gait belt;Rolling walker (2 wheels) Nurse Communication: Mobility status  Activity Tolerance:  Patient tolerated treatment well Patient left: in chair;with call bell/phone within reach;with chair alarm set  OT Visit Diagnosis: Unsteadiness on feet (R26.81);Other abnormalities of gait and mobility (R26.89);Muscle weakness (generalized) (M62.81);Pain;Other symptoms and signs involving cognitive function Pain - Right/Left: Left Pain - part of body: Arm;Leg                Time: 1345-1416 OT Time Calculation (min): 31 min Charges:  OT General Charges $OT Visit: 1 Visit OT Evaluation $OT Eval Moderate Complexity: 1 Mod OT Treatments $Self Care/Home Management : 8-22 mins  [address] OTR/L   Alexis Goodell 03/16/2023, 2:24 PM

## 2023-03-16 NOTE — Progress Notes (Signed)
PROGRESS NOTE                                                                                                                                                                                                             Patient Demographics:    Steve Kelly, is a 67 y.o. male, DOB - 1955-08-06, ZOX:[id number]  Outpatient Primary MD for the patient is Ellis Parents, FNP    LOS - 2  Admit date - 03/14/2023    Chief Complaint  Patient presents with   Altered Mental Status       Brief Narrative (HPI from H&P)    67 y.o. male with medical history significant of alcohol use disorder, HTN and vitamin D deficiency who presents to the ED with altered mental status.  According to family, patient or his at baseline 3 days ago. Today, he appeared weak and confused with difficulty ambulating. He has had multiple falls in the last month. During my evaluation, patient in mittens and still disoriented. Speech is occasionally incomprehensible.  Marland Kitchen He reports drinking a pint of liquor a day and has been drinking since he was 67 years old. He reports his last drink was yesterday, in the ER he was diagnosed with severe DTs and admitted to the hospital.   Subjective:   Patient in bed, appears comfortable, denies any headache, no fever, no chest pain or pressure, no shortness of breath , no abdominal pain. No new focal weakness.    Assessment  & Plan :   Severe acute metabolic encephalopathy due to DTs from alcohol withdrawal. He has longstanding history of heavy alcohol abuse, has had multiple falls recently at home, came to the ER with altered mental status and rapidly went into severe DTs, questionable seizure-like activity in the ER, head CT and CT C-spine nonacute. No fever or leukocytosis, has been placed on Librium along with CIWA protocol, high-dose IV thiamine, D5 drip for 24 hours, low-dose aspirin, PT, speech eval, EEG stable, counseled  to abstain from alcohol monitor.  Thrombocytopenia with fatty liver on ultrasound.  Likely due to #1 above.  Monitor.  Hypertension.  Beta-blocker and as needed IV Lopressor.  Hypokalemia, hypomagnesemia, vitamin D deficiency.  Replaced.  AKI.  Hydrate and monitor.  Follow bladder scans.  Mild Rhabdomyolysis.  Hydrated.      Condition - Extremely  Guarded  Family Communication  :    Provided contact  Ms. Ottis Stain [phone] as the contact person is the wrong contact person, Ms. Ottis Stain responded to the given phone number and claims she does not know Mr. Jd Vessel.  He was called 03/15/2023 at 7:25 AM  Code Status : Full code  Consults  : None  PUD Prophylaxis : PPI   Procedures  :     EEG - This study is within normal limits. No seizures or epileptiform discharges were seen throughout the recording. A normal interictal EEG does not exclude the diagnosis of epilepsy.  CT head & C spine - 1. No acute intracranial pathology. 2. No acute fracture or traumatic malalignment of the cervical spine. 3. Advanced multilevel degenerative changes as above  [place] -  Hepatic steatosis with focal fatty sparing.       Disposition Plan  :    Status is: Inpatient  DVT Prophylaxis  :  Heparinheparin injection 5,000 Units Start: 03/15/23 1400    Lab Results  Component Value Date   PLT 118 (L) 03/16/2023    Diet :  Diet Order             DIET SOFT Fluid consistency: Thin  Diet effective now                    Inpatient Medications  Scheduled Meds:  aspirin  81 mg Oral Daily   atenolol  50 mg Oral Daily   chlordiazePOXIDE  20 mg Oral TID   cloNIDine  0.2 mg Oral TID   folic acid  1 mg Oral Daily   heparin injection (subcutaneous)  5,000 Units Subcutaneous Q8H   multivitamin with minerals  1 tablet Oral Daily   pantoprazole (PROTONIX) IV  40 mg Intravenous Daily   potassium chloride  40 mEq Oral Once   [START ON 03/18/2023] thiamine (VITAMIN B1) injection  100 mg Intravenous  Daily   thiamine  500 mg Intravenous Daily   Vitamin D (Ergocalciferol)  50,000 Units Oral Q7 days   Continuous Infusions:  dextrose 5 % and 0.45 % NaCl with KCl 20 mEq/L 50 mL/hr at 03/16/23 0631   PRN Meds:.acetaminophen **OR** acetaminophen, labetalol, loperamide, LORazepam **OR** LORazepam, [DISCONTINUED] ondansetron **OR** ondansetron (ZOFRAN) IV, senna-docusate  Antibiotics  :    Anti-infectives (From admission, onward)    None         Objective:   Vitals:   03/15/23 2000 03/16/23 0000 03/16/23 0400 03/16/23 0640  BP: 118/78 (!) 168/86 (!) 187/122 137/89  Pulse: 93 81 75 77  Resp: 18 (!) 22 16 11   Temp: 97.9 F (36.6 C) 98.1 F (36.7 C) 98.2 F (36.8 C)   TempSrc: Axillary Axillary Axillary   SpO2: 95% 95% 96% 93%  Weight:      Height:        Wt Readings from Last 3 Encounters:  03/14/23 68 kg     Intake/Output Summary (Last 24 hours) at 03/16/2023 0748 Last data filed at 03/15/2023 1600 Gross per 24 hour  Intake 1014.81 ml  Output 400 ml  Net 614.81 ml     Physical Exam  Somnolent and somewhat confused, doing all 4 extremities to command, supple neck [place].AT,PERRAL Supple Neck, No JVD,   Symmetrical Chest wall movement, Good air movement bilaterally, CTAB RRR,No Gallops,Rubs or new Murmurs,  +ve B.Sounds, Abd Soft, No tenderness,   No Cyanosis, Clubbing or edema      Data Review:  Recent Labs  Lab 03/14/23 1136 03/15/23 0814 03/16/23 0319  WBC 4.5 3.4* 3.7*  HGB 12.1* 11.0* 10.9*  HCT 35.4* 31.7* 31.4*  PLT 100* 115* 118*  MCV 99.7 97.2 95.7  MCH 34.1* 33.7 33.2  MCHC 34.2 34.7 34.7  RDW 13.1 13.0 12.6  LYMPHSABS  --  0.9 1.2  MONOABS  --  0.7 0.8  EOSABS  --  0.0 0.0  BASOSABS  --  0.0 0.0    Recent Labs  Lab 03/14/23 1130 03/14/23 1136 03/14/23 1503 03/15/23 0814 03/16/23 0319  NA  --  139  --  139 135  K  --  3.2*  --  3.1* 3.1*  CL  --  99  --  102 103  CO2  --  16*  --  20* 22  ANIONGAP  --  24*  --  17* 10   GLUCOSE  --  71  --  75 115*  BUN  --  23  --  13 13  CREATININE  --  1.40*  --  1.01 0.92  AST  --  67*  --  49* 42*  ALT  --  28  --  22 21  ALKPHOS  --  40  --  37* 37*  BILITOT  --  2.7*  --  2.6* 1.5*  ALBUMIN  --  4.0  --  3.4* 3.2*  INR  --   --  1.1  --   --   AMMONIA 26  --   --   --   --   MG  --   --  1.5* 1.8 1.7  CALCIUM  --  10.1  --  9.6 9.1      Recent Labs  Lab 03/14/23 1130 03/14/23 1136 03/14/23 1503 03/15/23 0814 03/16/23 0319  INR  --   --  1.1  --   --   AMMONIA 26  --   --   --   --   MG  --   --  1.5* 1.8 1.7  CALCIUM  --  10.1  --  9.6 9.1   Recent Labs    03/14/23 1503  VITAMINB12 995*  FOLATE 12.0   Radiology Reports EEG adult  Result Date: 03/15/2023 Charlsie Quest, MD     03/15/2023 12:45 PM Patient Name: Steve Kelly MRN: [id number] Epilepsy Attending: Charlsie Quest Referring Physician/Provider: Steffanie Rainwater, MD Date: 03/15/2023 Duration: 23.48 mins Patient history: 67 y.o. male with medical history significant of alcohol use disorder, HTN and vitamin D deficiency admitted for alcohol withdrawal. EEG to evaluate for seizure Level of alertness: Awake AEDs during EEG study: Chlordiazepoxide Technical aspects: This EEG study was done with scalp electrodes positioned according to the 10-20 International system of electrode placement. Electrical activity was reviewed with band pass filter of 1-70Hz , sensitivity of 7 uV/mm, display speed of 50mm/sec with a 60Hz  notched filter applied as appropriate. EEG data were recorded continuously and digitally stored.  Video monitoring was available and reviewed as appropriate. Description: The posterior dominant rhythm consists of 9-10 Hz activity of moderate voltage (25-35 uV) seen predominantly in posterior head regions, symmetric and reactive to eye opening and eye closing. Hyperventilation and photic stimulation were not performed.   IMPRESSION: This study is within normal limits. No seizures or  epileptiform discharges were seen throughout the recording. A normal interictal EEG does not exclude the diagnosis of epilepsy. Priyanka Annabelle Harman   US Abdomen Limited RUQ (LIVER/GB)  Result  Date: 03/14/2023 CLINICAL DATA:  Thrombocytopenia, elevated LFTs EXAM: ULTRASOUND ABDOMEN LIMITED RIGHT UPPER QUADRANT COMPARISON:  None Available. FINDINGS: Gallbladder: No gallstones or wall thickening visualized. No sonographic Murphy sign noted by sonographer. Common bile duct: Diameter: 4 mm Liver: Hyperechoic hepatic parenchyma, suggesting hepatic steatosis, with focal fatty sparing along the gallbladder fossa. Portal vein is patent on color Doppler imaging with normal direction of blood flow towards the liver. Other: None. IMPRESSION: Hepatic steatosis with focal fatty sparing. Electronically Signed   By: Charline Bills M.D.   On: 03/14/2023 19:01   CT Head Wo Contrast  Result Date: 03/14/2023 CLINICAL DATA:  Head trauma, pain EXAM: CT HEAD WITHOUT CONTRAST CT CERVICAL SPINE WITHOUT CONTRAST TECHNIQUE: Multidetector CT imaging of the head and cervical spine was performed following the standard protocol without intravenous contrast. Multiplanar CT image reconstructions of the cervical spine were also generated. RADIATION DOSE REDUCTION: This exam was performed according to the departmental dose-optimization program which includes automated exposure control, adjustment of the mA and/or kV according to patient size and/or use of iterative reconstruction technique. COMPARISON:  CT head and cervical spine 01/16/2017 FINDINGS: CT HEAD FINDINGS Brain: There is no acute intracranial hemorrhage, extra-axial fluid collection, or acute infarct. Parenchymal volume is normal. The ventricles are normal in size. Gray-white differentiation is preserved. Hypodensity in the supratentorial white matter likely reflects underlying chronic small-vessel ischemic change. 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 The pituitary and suprasellar region are normal. There is no mass lesion. There is no mass effect or midline shift. Vascular: No hyperdense vessel or unexpected calcification. Skull: Normal. Negative for fracture or focal lesion. Sinuses/Orbits: The imaged paranasal sinuses are clear. The globes and orbits are unremarkable. Other: The mastoid air cells and middle ear cavities are clear. CT CERVICAL SPINE FINDINGS Alignment: There is mild degenerative anterolisthesis of C2 on C3. There is no evidence of traumatic malalignment. Skull base and vertebrae: Skull base alignment is maintained. Vertebral body heights are preserved. There is no evidence of acute fracture. There is no suspicious osseous lesion. Soft tissues and spinal canal: No prevertebral fluid or swelling. No visible canal hematoma. Disc levels: There is severe multilevel disc space narrowing and degenerative endplate change with prominent anterior osteophytes throughout the cervical spine. There is osseous fusion across the C5-C6 disc space. There is severe left facet arthropathy at C2-C3. Upper chest: The imaged lung apices are clear. Other: None. IMPRESSION: 1. No acute intracranial pathology. 2. No acute fracture or traumatic malalignment of the cervical spine. 3. Advanced multilevel degenerative changes as above. Electronically Signed   By: Lesia Hausen M.D.   On: 03/14/2023 12:58   CT Cervical Spine Wo Contrast  Result Date: 03/14/2023 CLINICAL DATA:  Head trauma, pain EXAM: CT HEAD WITHOUT CONTRAST CT CERVICAL SPINE WITHOUT CONTRAST TECHNIQUE: Multidetector CT imaging of the head and cervical spine was performed following the standard protocol without intravenous contrast. Multiplanar CT image reconstructions of the cervical spine were also generated. RADIATION DOSE REDUCTION: This exam was performed according to the departmental dose-optimization program which includes automated exposure control, adjustment of the mA and/or kV  according to patient size and/or use of iterative reconstruction technique. COMPARISON:  CT head and cervical spine 01/16/2017 FINDINGS: CT HEAD FINDINGS Brain: There is no acute intracranial hemorrhage, extra-axial fluid collection, or acute infarct. Parenchymal volume is normal. The ventricles are normal in size. Gray-white differentiation is preserved. Hypodensity in the supratentorial white matter likely reflects underlying chronic small-vessel ischemic change.  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 The pituitary and suprasellar region are normal. There is no mass lesion. There is no mass effect or midline shift. Vascular: No hyperdense vessel or unexpected calcification. Skull: Normal. Negative for fracture or focal lesion. Sinuses/Orbits: The imaged paranasal sinuses are clear. The globes and orbits are unremarkable. Other: The mastoid air cells and middle ear cavities are clear. CT CERVICAL SPINE FINDINGS Alignment: There is mild degenerative anterolisthesis of C2 on C3. There is no evidence of traumatic malalignment. Skull base and vertebrae: Skull base alignment is maintained. Vertebral body heights are preserved. There is no evidence of acute fracture. There is no suspicious osseous lesion. Soft tissues and spinal canal: No prevertebral fluid or swelling. No visible canal hematoma. Disc levels: There is severe multilevel disc space narrowing and degenerative endplate change with prominent anterior osteophytes throughout the cervical spine. There is osseous fusion across the C5-C6 disc space. There is severe left facet arthropathy at C2-C3. Upper chest: The imaged lung apices are clear. Other: None. IMPRESSION: 1. No acute intracranial pathology. 2. No acute fracture or traumatic malalignment of the cervical spine. 3. Advanced multilevel degenerative changes as above. Electronically Signed   By: Lesia Hausen M.D.   On: 03/14/2023 12:58   DG Knee Complete 4 Views Left  Result  Date: 03/14/2023 CLINICAL DATA:  Fall, pain EXAM: LEFT KNEE - COMPLETE 4+ VIEW COMPARISON:  None Available. FINDINGS: There is no acute fracture or dislocation. Knee alignment is normal. There is moderate medial compartment joint space narrowing with associated subchondral sclerosis and osteophytosis. There is no erosive change. There is no significant suprapatellar effusion, though positioning is suboptimal on the lateral projection. IMPRESSION: No acute finding. Moderate medial compartment joint space narrowing. Electronically Signed   By: Lesia Hausen M.D.   On: 03/14/2023 11:56      Signature  -   Susa Raring M.D on 03/16/2023 at 7:48 AM   -  To page go to [email or web address]

## 2023-03-17 DIAGNOSIS — F10931 Alcohol use, unspecified with withdrawal delirium: Secondary | ICD-10-CM | POA: Diagnosis not present

## 2023-03-17 LAB — COMPREHENSIVE METABOLIC PANEL
ALT: 17 U/L (ref 0–44)
AST: 34 U/L (ref 15–41)
Albumin: 3 g/dL — ABNORMAL LOW (ref 3.5–5.0)
Alkaline Phosphatase: 41 U/L (ref 38–126)
Anion gap: 9 (ref 5–15)
BUN: 7 mg/dL — ABNORMAL LOW (ref 8–23)
CO2: 23 mmol/L (ref 22–32)
Calcium: 9.1 mg/dL (ref 8.9–10.3)
Chloride: 104 mmol/L (ref 98–111)
Creatinine, Ser: 0.81 mg/dL (ref 0.61–1.24)
GFR, Estimated: >60 mL/min (ref >60)
Glucose, Bld: 128 mg/dL — ABNORMAL HIGH (ref 70–99)
Potassium: 3.6 mmol/L (ref 3.5–5.1)
Sodium: 136 mmol/L (ref 135–145)
Total Bilirubin: 1.2 mg/dL — ABNORMAL HIGH (ref <1.2)
Total Protein: 6.7 g/dL (ref 6.5–8.1)

## 2023-03-17 LAB — CBC WITH DIFFERENTIAL/PLATELET
Abs Immature Granulocytes: 0.02 10*3/uL (ref 0.00–0.07)
Basophils Absolute: 0 10*3/uL (ref 0.0–0.1)
Basophils Relative: 1 %
Eosinophils Absolute: 0.1 10*3/uL (ref 0.0–0.5)
Eosinophils Relative: 1 %
HCT: 31.5 % — ABNORMAL LOW (ref 39.0–52.0)
Hemoglobin: 10.8 g/dL — ABNORMAL LOW (ref 13.0–17.0)
Immature Granulocytes: 1 %
Lymphocytes Relative: 32 %
Lymphs Abs: 1.4 10*3/uL (ref 0.7–4.0)
MCH: 32.9 pg (ref 26.0–34.0)
MCHC: 34.3 g/dL (ref 30.0–36.0)
MCV: 96 fL (ref 80.0–100.0)
Monocytes Absolute: 0.9 10*3/uL (ref 0.1–1.0)
Monocytes Relative: 22 %
Neutro Abs: 1.9 10*3/uL (ref 1.7–7.7)
Neutrophils Relative %: 43 %
Platelets: 146 10*3/uL — ABNORMAL LOW (ref 150–400)
RBC: 3.28 MIL/uL — ABNORMAL LOW (ref 4.22–5.81)
RDW: 12.3 % (ref 11.5–15.5)
WBC: 4.3 10*3/uL (ref 4.0–10.5)
nRBC: 0 % (ref 0.0–0.2)

## 2023-03-17 LAB — PHOSPHORUS: Phosphorus: 2.1 mg/dL — ABNORMAL LOW (ref 2.5–4.6)

## 2023-03-17 LAB — MAGNESIUM: Magnesium: 1.3 mg/dL — ABNORMAL LOW (ref 1.7–2.4)

## 2023-03-17 MED ORDER — SODIUM PHOSPHATES 45 MMOLE/15ML IV SOLN
30.0000 mmol | Freq: Once | INTRAVENOUS | Status: AC
  Administered 2023-03-17: 30 mmol via INTRAVENOUS
  Filled 2023-03-17: qty 10

## 2023-03-17 MED ORDER — AMLODIPINE BESYLATE 10 MG PO TABS
10.0000 mg | ORAL_TABLET | Freq: Every day | ORAL | Status: DC
  Administered 2023-03-17: 10 mg via ORAL
  Filled 2023-03-17 (×2): qty 1

## 2023-03-17 MED ORDER — MAGNESIUM SULFATE 4 GM/100ML IV SOLN
4.0000 g | Freq: Once | INTRAVENOUS | Status: AC
  Administered 2023-03-17: 4 g via INTRAVENOUS
  Filled 2023-03-17: qty 100

## 2023-03-17 MED ORDER — POTASSIUM CHLORIDE CRYS ER 20 MEQ PO TBCR
40.0000 meq | EXTENDED_RELEASE_TABLET | Freq: Once | ORAL | Status: AC
  Administered 2023-03-17: 40 meq via ORAL
  Filled 2023-03-17: qty 2

## 2023-03-17 MED ORDER — KCL IN DEXTROSE-NACL 20-5-0.45 MEQ/L-%-% IV SOLN
INTRAVENOUS | Status: DC
  Filled 2023-03-17: qty 1000

## 2023-03-17 MED ORDER — PANTOPRAZOLE SODIUM 40 MG PO TBEC: 40.0000 mg | DELAYED_RELEASE_TABLET | Freq: Every day | ORAL | Status: DC

## 2023-03-17 MED ORDER — THIAMINE MONONITRATE 100 MG PO TABS
100.0000 mg | ORAL_TABLET | Freq: Every day | ORAL | Status: DC
  Administered 2023-03-18 – 2023-03-19 (×2): 100 mg via ORAL
  Filled 2023-03-17 (×2): qty 1

## 2023-03-17 NOTE — Progress Notes (Signed)
PROGRESS NOTE                                                                                                                                                                                                             Patient Demographics:    Steve Kelly, is a 67 y.o. male, DOB - 1955/10/18, WUJ:[id number]  Outpatient Primary MD for the patient is Ellis Parents, FNP    LOS - 3  Admit date - 03/14/2023    Chief Complaint  Patient presents with   Altered Mental Status       Brief Narrative (HPI from H&P)    67 y.o. male with medical history significant of alcohol use disorder, HTN and vitamin D deficiency who presents to the ED with altered mental status.  According to family, patient or his at baseline 3 days ago. Today, he appeared weak and confused with difficulty ambulating. He has had multiple falls in the last month. During my evaluation, patient in mittens and still disoriented. Speech is occasionally incomprehensible.  Marland Kitchen He reports drinking a pint of liquor a day and has been drinking since he was 67 years old. He reports his last drink was yesterday, in the ER he was diagnosed with severe DTs and admitted to the hospital.   Subjective:   Patient in bed, appears comfortable, denies any headache, no fever, no chest pain or pressure, no shortness of breath , no abdominal pain. No new focal weakness.    Assessment  & Plan :   Severe acute metabolic encephalopathy due to DTs from alcohol withdrawal. He has longstanding history of heavy alcohol abuse, has had multiple falls recently at home, came to the ER with altered mental status and rapidly went into severe DTs, questionable seizure-like activity in the ER, head CT and CT C-spine nonacute. No fever or leukocytosis, has been placed on Librium along with CIWA protocol, high-dose IV thiamine, D5 drip for 24 hours, low-dose aspirin, PT, speech eval, EEG stable,  counseled to abstain from alcohol monitor.  Thrombocytopenia with fatty liver on ultrasound.  Likely due to #1 above.  Monitor.  Hypertension.  PO Beta-blocker, add Norvasc for better control and continue needed IV Lopressor.  Hypokalemia, hypomagnesemia, vitamin D deficiency.  Replaced.  AKI.  Hydrate and monitor.  Follow bladder scans.  Mild Rhabdomyolysis.  Hydrated.  Condition - Extremely Guarded  Family Communication  :    Provided contact  Ms. Ottis Stain [phone] as the contact person is the wrong contact person, Ms. Ottis Stain responded to the given phone number and claims she does not know Mr. Asmar Behe.  He was called 03/15/2023 at 7:25 AM  Code Status : Full code  Consults  : None  PUD Prophylaxis : PPI   Procedures  :     EEG - This study is within normal limits. No seizures or epileptiform discharges were seen throughout the recording. A normal interictal EEG does not exclude the diagnosis of epilepsy.  CT head & C spine - 1. No acute intracranial pathology. 2. No acute fracture or traumatic malalignment of the cervical spine. 3. Advanced multilevel degenerative changes as above  [place] -  Hepatic steatosis with focal fatty sparing.       Disposition Plan  :    Status is: Inpatient  DVT Prophylaxis  :  Heparinheparin injection 5,000 Units Start: 03/15/23 1400    Lab Results  Component Value Date   PLT 146 (L) 03/17/2023    Diet :  Diet Order             DIET SOFT Fluid consistency: Thin  Diet effective now                    Inpatient Medications  Scheduled Meds:  aspirin  81 mg Oral Daily   atenolol  50 mg Oral Daily   chlordiazePOXIDE  20 mg Oral TID   cloNIDine  0.2 mg Oral TID   folic acid  1 mg Oral Daily   heparin injection (subcutaneous)  5,000 Units Subcutaneous Q8H   multivitamin with minerals  1 tablet Oral Daily   pantoprazole (PROTONIX) IV  40 mg Intravenous Daily   [START ON 03/18/2023] thiamine (VITAMIN B1) injection  100 mg  Intravenous Daily   thiamine  500 mg Intravenous Daily   Vitamin D (Ergocalciferol)  50,000 Units Oral Q7 days   Continuous Infusions:  dextrose 5 % and 0.45 % NaCl with KCl 20 mEq/L 50 mL/hr at 03/17/23 0626   magnesium sulfate bolus IVPB 4 g (03/17/23 0637)   sodium phosphate 30 mmol in dextrose 5 % 250 mL infusion     PRN Meds:.acetaminophen **OR** acetaminophen, labetalol, loperamide, LORazepam **OR** LORazepam, [DISCONTINUED] ondansetron **OR** ondansetron (ZOFRAN) IV, senna-docusate  Antibiotics  :    Anti-infectives (From admission, onward)    None         Objective:   Vitals:   03/16/23 1527 03/16/23 2045 03/17/23 0000 03/17/23 0400  BP: 120/76 (!) 149/84 (!) 163/90 (!) 160/83  Pulse: 80 73 67 70  Resp: 19 (!) 22 14 18   Temp: 98.5 F (36.9 C) 98.3 F (36.8 C) 97.8 F (36.6 C) 98.9 F (37.2 C)  TempSrc: Oral Oral Axillary Axillary  SpO2: 95% 98% 95% 91%  Weight:      Height:        Wt Readings from Last 3 Encounters:  03/14/23 68 kg     Intake/Output Summary (Last 24 hours) at 03/17/2023 0724 Last data filed at 03/17/2023 0347 Gross per 24 hour  Intake 1064.11 ml  Output 1225 ml  Net -160.89 ml     Physical Exam  Awake, mildly confused, doing all 4 extremities to command, supple neck [place].AT,PERRAL Supple Neck, No JVD,   Symmetrical Chest wall movement, Good air movement bilaterally, CTAB RRR,No Gallops,Rubs or new Murmurs,  +ve  B.Sounds, Abd Soft, No tenderness,   No Cyanosis, Clubbing or edema      Data Review:    Recent Labs  Lab 03/14/23 1136 03/15/23 0814 03/16/23 0319 03/17/23 0333  WBC 4.5 3.4* 3.7* 4.3  HGB 12.1* 11.0* 10.9* 10.8*  HCT 35.4* 31.7* 31.4* 31.5*  PLT 100* 115* 118* 146*  MCV 99.7 97.2 95.7 96.0  MCH 34.1* 33.7 33.2 32.9  MCHC 34.2 34.7 34.7 34.3  RDW 13.1 13.0 12.6 12.3  LYMPHSABS  --  0.9 1.2 1.4  MONOABS  --  0.7 0.8 0.9  EOSABS  --  0.0 0.0 0.1  BASOSABS  --  0.0 0.0 0.0    Recent Labs  Lab  03/14/23 1130 03/14/23 1136 03/14/23 1503 03/15/23 0814 03/16/23 0319 03/17/23 0333  NA  --  139  --  139 135 136  K  --  3.2*  --  3.1* 3.1* 3.6  CL  --  99  --  102 103 104  CO2  --  16*  --  20* 22 23  ANIONGAP  --  24*  --  17* 10 9  GLUCOSE  --  71  --  75 115* 128*  BUN  --  23  --  13 13 7*  CREATININE  --  1.40*  --  1.01 0.92 0.81  AST  --  67*  --  49* 42* 34  ALT  --  28  --  22 21 17   ALKPHOS  --  40  --  37* 37* 41  BILITOT  --  2.7*  --  2.6* 1.5* 1.2*  ALBUMIN  --  4.0  --  3.4* 3.2* 3.0*  INR  --   --  1.1  --   --   --   AMMONIA 26  --   --   --   --   --   MG  --   --  1.5* 1.8 1.7 1.3*  CALCIUM  --  10.1  --  9.6 9.1 9.1      Recent Labs  Lab 03/14/23 1130 03/14/23 1136 03/14/23 1503 03/15/23 0814 03/16/23 0319 03/17/23 0333  INR  --   --  1.1  --   --   --   AMMONIA 26  --   --   --   --   --   MG  --   --  1.5* 1.8 1.7 1.3*  CALCIUM  --  10.1  --  9.6 9.1 9.1   Recent Labs    03/14/23 1503  VITAMINB12 995*  FOLATE 12.0   Radiology Reports EEG adult  Result Date: 03/15/2023 Charlsie Quest, MD     03/15/2023 12:45 PM Patient Name: MYCHAEL SMOCK MRN: [id number] Epilepsy Attending: Charlsie Quest Referring Physician/Provider: Steffanie Rainwater, MD Date: 03/15/2023 Duration: 23.48 mins Patient history: 67 y.o. male with medical history significant of alcohol use disorder, HTN and vitamin D deficiency admitted for alcohol withdrawal. EEG to evaluate for seizure Level of alertness: Awake AEDs during EEG study: Chlordiazepoxide Technical aspects: This EEG study was done with scalp electrodes positioned according to the 10-20 International system of electrode placement. Electrical activity was reviewed with band pass filter of 1-70Hz , sensitivity of 7 uV/mm, display speed of 56mm/sec with a 60Hz  notched filter applied as appropriate. EEG data were recorded continuously and digitally stored.  Video monitoring was available and reviewed as  appropriate. Description: The posterior dominant rhythm consists of 9-10 Hz activity  of moderate voltage (25-35 uV) seen predominantly in posterior head regions, symmetric and reactive to eye opening and eye closing. Hyperventilation and photic stimulation were not performed.   IMPRESSION: This study is within normal limits. No seizures or epileptiform discharges were seen throughout the recording. A normal interictal EEG does not exclude the diagnosis of epilepsy. Priyanka Annabelle Harman   US Abdomen Limited RUQ (LIVER/GB)  Result Date: 03/14/2023 CLINICAL DATA:  Thrombocytopenia, elevated LFTs EXAM: ULTRASOUND ABDOMEN LIMITED RIGHT UPPER QUADRANT COMPARISON:  None Available. FINDINGS: Gallbladder: No gallstones or wall thickening visualized. No sonographic Murphy sign noted by sonographer. Common bile duct: Diameter: 4 mm Liver: Hyperechoic hepatic parenchyma, suggesting hepatic steatosis, with focal fatty sparing along the gallbladder fossa. Portal vein is patent on color Doppler imaging with normal direction of blood flow towards the liver. Other: None. IMPRESSION: Hepatic steatosis with focal fatty sparing. Electronically Signed   By: Charline Bills M.D.   On: 03/14/2023 19:01   CT Head Wo Contrast  Result Date: 03/14/2023 CLINICAL DATA:  Head trauma, pain EXAM: CT HEAD WITHOUT CONTRAST CT CERVICAL SPINE WITHOUT CONTRAST TECHNIQUE: Multidetector CT imaging of the head and cervical spine was performed following the standard protocol without intravenous contrast. Multiplanar CT image reconstructions of the cervical spine were also generated. RADIATION DOSE REDUCTION: This exam was performed according to the departmental dose-optimization program which includes automated exposure control, adjustment of the mA and/or kV according to patient size and/or use of iterative reconstruction technique. COMPARISON:  CT head and cervical spine 01/16/2017 FINDINGS: CT HEAD FINDINGS Brain: There is no acute intracranial  hemorrhage, extra-axial fluid collection, or acute infarct. Parenchymal volume is normal. The ventricles are normal in size. Gray-white differentiation is preserved. Hypodensity in the supratentorial white matter likely reflects underlying chronic small-vessel ischemic change. 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 The pituitary and suprasellar region are normal. There is no mass lesion. There is no mass effect or midline shift. Vascular: No hyperdense vessel or unexpected calcification. Skull: Normal. Negative for fracture or focal lesion. Sinuses/Orbits: The imaged paranasal sinuses are clear. The globes and orbits are unremarkable. Other: The mastoid air cells and middle ear cavities are clear. CT CERVICAL SPINE FINDINGS Alignment: There is mild degenerative anterolisthesis of C2 on C3. There is no evidence of traumatic malalignment. Skull base and vertebrae: Skull base alignment is maintained. Vertebral body heights are preserved. There is no evidence of acute fracture. There is no suspicious osseous lesion. Soft tissues and spinal canal: No prevertebral fluid or swelling. No visible canal hematoma. Disc levels: There is severe multilevel disc space narrowing and degenerative endplate change with prominent anterior osteophytes throughout the cervical spine. There is osseous fusion across the C5-C6 disc space. There is severe left facet arthropathy at C2-C3. Upper chest: The imaged lung apices are clear. Other: None. IMPRESSION: 1. No acute intracranial pathology. 2. No acute fracture or traumatic malalignment of the cervical spine. 3. Advanced multilevel degenerative changes as above. Electronically Signed   By: Lesia Hausen M.D.   On: 03/14/2023 12:58   CT Cervical Spine Wo Contrast  Result Date: 03/14/2023 CLINICAL DATA:  Head trauma, pain EXAM: CT HEAD WITHOUT CONTRAST CT CERVICAL SPINE WITHOUT CONTRAST TECHNIQUE: Multidetector CT imaging of the head and cervical spine was  performed following the standard protocol without intravenous contrast. Multiplanar CT image reconstructions of the cervical spine were also generated. RADIATION DOSE REDUCTION: This exam was performed according to the departmental dose-optimization program which includes automated exposure  control, adjustment of the mA and/or kV according to patient size and/or use of iterative reconstruction technique. COMPARISON:  CT head and cervical spine 01/16/2017 FINDINGS: CT HEAD FINDINGS Brain: There is no acute intracranial hemorrhage, extra-axial fluid collection, or acute infarct. Parenchymal volume is normal. The ventricles are normal in size. Gray-white differentiation is preserved. Hypodensity in the supratentorial white matter likely reflects underlying chronic small-vessel ischemic change. 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 The pituitary and suprasellar region are normal. There is no mass lesion. There is no mass effect or midline shift. Vascular: No hyperdense vessel or unexpected calcification. Skull: Normal. Negative for fracture or focal lesion. Sinuses/Orbits: The imaged paranasal sinuses are clear. The globes and orbits are unremarkable. Other: The mastoid air cells and middle ear cavities are clear. CT CERVICAL SPINE FINDINGS Alignment: There is mild degenerative anterolisthesis of C2 on C3. There is no evidence of traumatic malalignment. Skull base and vertebrae: Skull base alignment is maintained. Vertebral body heights are preserved. There is no evidence of acute fracture. There is no suspicious osseous lesion. Soft tissues and spinal canal: No prevertebral fluid or swelling. No visible canal hematoma. Disc levels: There is severe multilevel disc space narrowing and degenerative endplate change with prominent anterior osteophytes throughout the cervical spine. There is osseous fusion across the C5-C6 disc space. There is severe left facet arthropathy at C2-C3. Upper chest:  The imaged lung apices are clear. Other: None. IMPRESSION: 1. No acute intracranial pathology. 2. No acute fracture or traumatic malalignment of the cervical spine. 3. Advanced multilevel degenerative changes as above. Electronically Signed   By: Lesia Hausen M.D.   On: 03/14/2023 12:58   DG Knee Complete 4 Views Left  Result Date: 03/14/2023 CLINICAL DATA:  Fall, pain EXAM: LEFT KNEE - COMPLETE 4+ VIEW COMPARISON:  None Available. FINDINGS: There is no acute fracture or dislocation. Knee alignment is normal. There is moderate medial compartment joint space narrowing with associated subchondral sclerosis and osteophytosis. There is no erosive change. There is no significant suprapatellar effusion, though positioning is suboptimal on the lateral projection. IMPRESSION: No acute finding. Moderate medial compartment joint space narrowing. Electronically Signed   By: Lesia Hausen M.D.   On: 03/14/2023 11:56      Signature  -   Susa Raring M.D on 03/17/2023 at 7:24 AM   -  To page go to [email or web address]

## 2023-03-17 NOTE — TOC Progression Note (Signed)
Transition of Care [facility]) - Progression Note    Patient Details  Name: Steve Kelly MRN: [id number] Date of Birth: 10-28-1955  [facility] CM/SW Contact  Mearl Latin, LCSW Phone Number: 03/17/2023, 11:41 AM  Clinical Narrative:    CSW received request to speak with patient's son. CSW spoke with Kandis Mannan who requested rehab services for patient at discharge. CSW will follow up with therapies to determine if he will need SNF versus home health.   ETOH resources placed on AVS for follow up as patient is currently disoriented.     Barriers to Discharge: Continued Medical Work up  Expected Discharge Plan and Services In-house Referral: Clinical Social Work                                             Social Determinants of Health (SDOH) Interventions SDOH Screenings   Food Insecurity: Patient Unable To Answer (03/15/2023)  Housing: Patient Unable To Answer (03/15/2023)  Transportation Needs: Patient Unable To Answer (03/15/2023)  Utilities: Patient Unable To Answer (03/15/2023)  Tobacco Use: Low Risk  (03/14/2023)    Readmission Risk Interventions     No data to display

## 2023-03-17 NOTE — Progress Notes (Signed)
Speech Language Pathology Treatment: Dysphagia  Patient Details Name: Steve Kelly MRN: [id number] DOB: 1955/08/22 Today's Date: 03/17/2023 Time: 1211-1220 SLP Time Calculation (min) (ACUTE ONLY): 9 min  Assessment / Plan / Recommendation Clinical Impression  Pt sitting upright in chair, endorsing history of esophageal reflux for which he does not take medication. Observed pt with regular texture solids and thin liquids with no overt s/s of dysphagia or aspiration. Given edentulism, recommend Dys 3 diet with thin liquids and no further SLP f/u. Provided education regarding esophageal precautions and will s/o at this time.    HPI HPI: Steve Kelly is a 67 yo male presenting to ED 11/3 with AMS. Admitted with AKI and seizure activity. CTH negative for acute changes. PMH includes alcohol use disorder, HTN, vitamin D deficiency      SLP Plan  All goals met      Recommendations for follow up therapy are one component of a multi-disciplinary discharge planning process, led by the attending physician.  Recommendations may be updated based on patient status, additional functional criteria and insurance authorization.    Recommendations  Diet recommendations: Regular;Thin liquid Liquids provided via: Cup;Straw Medication Administration: Whole meds with puree Supervision: Patient able to self feed Compensations: Slow rate;Small sips/bites Postural Changes and/or Swallow Maneuvers: Out of bed for meals;Seated upright 90 degrees                  Oral care BID;Staff/trained caregiver to provide oral care   PRN Dysphagia, unspecified (R13.10)     All goals met     Gwynneth Aliment, M.A., CF-SLP Speech Language Pathology, Acute Rehabilitation Services  Secure Chat preferred [phone]   03/17/2023, 12:24 PM

## 2023-03-17 NOTE — Plan of Care (Signed)
  Problem: Safety: Goal: Ability to remain free from injury will improve Outcome: Progressing   Problem: Education: Goal: Knowledge of disease or condition will improve Outcome: Progressing   Problem: Health Behavior/Discharge Planning: Goal: Ability to identify changes in lifestyle to reduce recurrence of condition will improve Outcome: Progressing

## 2023-03-17 NOTE — Plan of Care (Signed)
  Problem: Education: Goal: Knowledge of General Education information will improve Description: Including pain rating scale, medication(s)/side effects and non-pharmacologic comfort measures Outcome: Progressing   Problem: Health Behavior/Discharge Planning: Goal: Ability to manage health-related needs will improve Outcome: Progressing   Problem: Clinical Measurements: Goal: Ability to maintain clinical measurements within normal limits will improve Outcome: Progressing Goal: Will remain free from infection Outcome: Progressing Goal: Diagnostic test results will improve Outcome: Progressing Goal: Respiratory complications will improve Outcome: Progressing Goal: Cardiovascular complication will be avoided Outcome: Progressing   Problem: Activity: Goal: Risk for activity intolerance will decrease Outcome: Progressing   Problem: Nutrition: Goal: Adequate nutrition will be maintained Outcome: Progressing   Problem: Coping: Goal: Level of anxiety will decrease Outcome: Progressing   Problem: Elimination: Goal: Will not experience complications related to bowel motility Outcome: Progressing Goal: Will not experience complications related to urinary retention Outcome: Progressing   Problem: Pain Management: Goal: General experience of comfort will improve Outcome: Progressing   Problem: Safety: Goal: Ability to remain free from injury will improve Outcome: Progressing   Problem: Skin Integrity: Goal: Risk for impaired skin integrity will decrease Outcome: Progressing   Problem: Education: Goal: Knowledge of disease or condition will improve Outcome: Progressing Goal: Understanding of discharge needs will improve Outcome: Progressing   Problem: Health Behavior/Discharge Planning: Goal: Ability to identify changes in lifestyle to reduce recurrence of condition will improve Outcome: Progressing Goal: Identification of resources available to assist in meeting health  care needs will improve Outcome: Progressing   Problem: Physical Regulation: Goal: Complications related to the disease process, condition or treatment will be avoided or minimized Outcome: Progressing   Problem: Safety: Goal: Ability to remain free from injury will improve Outcome: Progressing

## 2023-03-17 NOTE — Care Management Important Message (Signed)
Important Message  Patient Details  Name: Steve Kelly MRN: [id number] Date of Birth: 09/27/1955   Important Message Given:  Yes - Medicare IM     Dorena Bodo 03/17/2023, 3:34 PM

## 2023-03-17 NOTE — Progress Notes (Signed)
Occupational Therapy Treatment Patient Details Name: Steve Kelly MRN: [id number] DOB: 08/19/1955 Today's Date: 03/17/2023   History of present illness 67 y.o. male who presents to the ED with altered mental status. +alcohol withdrawal; AKI; ?seizure 11/03; head CT no acute changes;   PMH significant of alcohol use disorder, HTN and vitamin D deficiency   OT comments  Pt c/o no pain, feeling alright, continues to have decreased balance. Co-Treat with PT for safety with mobility and OOB activities. Pt OOB on BSC upon arrival, able to stand as needed with HHA to complete toileting. Pt displays decreased safety awareness with mobility/transfers, decreased knowledge of how to use RW safely, consistent verbal cueing for correct positioning of RW. Pt demonstrated some fatigue and decreased balance at end of walk, but not aware that he was rushing or "plopped" onto the bed appearing fatigued. Due to poor overall balance and decreased safety awareness, Pt would benefit from postacute rehab <3hrs/day to improve with functional independence, will continue to see acutely to progress as able.       If plan is discharge home, recommend the following:  A little help with walking and/or transfers;A little help with bathing/dressing/bathroom;Assistance with cooking/housework;Assist for transportation;Help with stairs or ramp for entrance   Equipment Recommendations  Tub/shower bench;Other (comment)    Recommendations for Other Services      Precautions / Restrictions Precautions Precautions: Fall Restrictions Weight Bearing Restrictions: No       Mobility Bed Mobility Overal bed mobility: Needs Assistance             General bed mobility comments: arrived OOB    Transfers Overall transfer level: Needs assistance Equipment used: Rolling walker (2 wheels) Transfers: Sit to/from Stand, Bed to chair/wheelchair/BSC Sit to Stand: Contact guard assist     Step pivot transfers: Min assist      General transfer comment: CGA-min A for transfers, poor overall balance and safety awareness, not used to using RW     Balance Overall balance assessment: Needs assistance Sitting-balance support: No upper extremity supported, Feet supported Sitting balance-Leahy Scale: Fair Sitting balance - Comments: EOB ADLs   Standing balance support: Bilateral upper extremity supported, Reliant on assistive device for balance, During functional activity Standing balance-Leahy Scale: Fair Standing balance comment: able to stand as needed with BUE support to complete toileting hygiene                           ADL either performed or assessed with clinical judgement   ADL Overall ADL's : Needs assistance/impaired Eating/Feeding: Independent   Grooming: Set up;Sitting               Lower Body Dressing: Contact guard assist;Sit to/from stand   Toilet Transfer: Minimal assistance;BSC/3in1;Rolling walker (2 wheels)   Toileting- Clothing Manipulation and Hygiene: Minimal assistance;Sit to/from stand       Functional mobility during ADLs: Minimal assistance;Rolling walker (2 wheels) General ADL Comments: min A for mobility for safety with turns    Extremity/Trunk Assessment Upper Extremity Assessment Upper Extremity Assessment: Overall WFL for tasks assessed            Vision       Perception     Praxis      Cognition Arousal: Alert Behavior During Therapy: WFL for tasks assessed/performed Overall Cognitive Status: Impaired/Different from baseline Area of Impairment: Safety/judgement  Safety/Judgement: Decreased awareness of deficits, Decreased awareness of safety     General Comments: Pt oriented to time, place, self, does not fully recall situation. Able to follow directions consistently, poor overall safety awareness with mobility        Exercises      Shoulder Instructions       General Comments       Pertinent Vitals/ Pain       Pain Assessment Pain Assessment: No/denies pain  Home Living                                          Prior Functioning/Environment              Frequency  Min 1X/week        Progress Toward Goals  OT Goals(current goals can now be found in the care plan section)  Progress towards OT goals: Progressing toward goals  Acute Rehab OT Goals Patient Stated Goal: to improve balance OT Goal Formulation: With patient Time For Goal Achievement: 03/30/23 Potential to Achieve Goals: Good ADL Goals Pt Will Perform Lower Body Dressing: with set-up;sit to/from stand Pt Will Transfer to Toilet: with modified independence;ambulating Pt Will Perform Toileting - Clothing Manipulation and hygiene: with modified independence Pt Will Perform Tub/Shower Transfer: with modified independence;tub bench;rolling walker  Plan      Co-evaluation    PT/OT/SLP Co-Evaluation/Treatment: Yes Reason for Co-Treatment: Necessary to address cognition/behavior during functional activity;For patient/therapist safety PT goals addressed during session: Mobility/safety with mobility;Balance;Proper use of DME OT goals addressed during session: ADL's and self-care;Proper use of Adaptive equipment and DME      AM-PAC OT "6 Clicks" Daily Activity     Outcome Measure   Help from another person eating meals?: None Help from another person taking care of personal grooming?: A Little Help from another person toileting, which includes using toliet, bedpan, or urinal?: A Little Help from another person bathing (including washing, rinsing, drying)?: A Little Help from another person to put on and taking off regular upper body clothing?: A Little Help from another person to put on and taking off regular lower body clothing?: A Little 6 Click Score: 19    End of Session Equipment Utilized During Treatment: Gait belt;Rolling walker (2 wheels)  OT Visit  Diagnosis: Unsteadiness on feet (R26.81);Other abnormalities of gait and mobility (R26.89);Muscle weakness (generalized) (M62.81);Pain;Other symptoms and signs involving cognitive function Pain - Right/Left: Left Pain - part of body: Arm;Leg   Activity Tolerance Patient tolerated treatment well   Patient Left in chair;with call bell/phone within reach;with chair alarm set   Nurse Communication Mobility status        Time: 1610-9604 OT Time Calculation (min): 28 min  Charges: OT General Charges $OT Visit: 1 Visit OT Treatments $Self Care/Home Management : 8-22 mins  [place], OTR/L   Alexis Goodell 03/17/2023, 2:39 PM

## 2023-03-17 NOTE — Progress Notes (Signed)
Physical Therapy Treatment Patient Details Name: Steve Kelly MRN: [id number] DOB: 1955/05/13 Today's Date: 03/17/2023   History of Present Illness 67 y.o. male who presents to the ED with altered mental status. +alcohol withdrawal; AKI; ?seizure 11/03; head CT no acute changes;   PMH significant of alcohol use disorder, HTN and vitamin D deficiency    PT Comments  Pt received on [facility] with nursing present and agreeable to session. Pt continues to require safety cues throughout session due to decreased awareness of deficits. Pt able to progress gait distance this session, however requires increased cues for use of RW and awareness due to pt reporting no fatigue, but demonstrating DOE once returned to room. Pt demonstrates increased instability without AD during short transfer from bed to chair. Due to pt's current deficits and assist required, patient will benefit from continued inpatient follow up therapy, <3 hours/day. Discharge recommendations were updated after discussion with supervising PT Jerolyn Center. Pt continues to benefit from PT services to progress toward functional mobility goals.    If plan is discharge home, recommend the following:     Can travel by private vehicle     Yes  Equipment Recommendations  Other (comment) (TBD)    Recommendations for Other Services       Precautions / Restrictions Precautions Precautions: Fall Restrictions Weight Bearing Restrictions: No     Mobility  Bed Mobility               General bed mobility comments: Pt OOB upon entry    Transfers Overall transfer level: Needs assistance Equipment used: Rolling walker (2 wheels), None Transfers: Sit to/from Stand, Bed to chair/wheelchair/BSC Sit to Stand: Contact guard assist, Min assist   Step pivot transfers: Min assist, +2 safety/equipment       General transfer comment: Min A progressing to CGA for standing from BSC and EOB. MinA +2 for balance to transfer to recliner without AD.     Ambulation/Gait Ambulation/Gait assistance: Contact guard assist, Min assist, +2 safety/equipment Gait Distance (Feet): 75 Feet Assistive device: Rolling walker (2 wheels) Gait Pattern/deviations: Step-through pattern, Trunk flexed, Shuffle, Decreased stride length       General Gait Details: Pt demonstrating short steps with cues for upright posture and RW proximity,but pt demonstrating limited improvement. Pt demonstrating increased shuffling with progressed distance and in more narrow spaces in room requiring intermittent min A for balance       Balance Overall balance assessment: Needs assistance Sitting-balance support: No upper extremity supported, Feet supported Sitting balance-Leahy Scale: Fair Sitting balance - Comments: sitting EOB   Standing balance support: Bilateral upper extremity supported, Reliant on assistive device for balance, During functional activity Standing balance-Leahy Scale: Fair                              Cognition Arousal: Alert Behavior During Therapy: WFL for tasks assessed/performed Overall Cognitive Status: Impaired/Different from baseline                                 General Comments: Pt with decreased safety awareness requiring cues throughout session. Pt answering questions, but with limited verbalizations during session        Exercises      General Comments        Pertinent Vitals/Pain Pain Assessment Pain Assessment: No/denies pain     PT Goals (current goals can now  be found in the care plan section) Acute Rehab PT Goals Patient Stated Goal: unable to state PT Goal Formulation: With patient/family Time For Goal Achievement: 03/29/23 Progress towards PT goals: Progressing toward goals    Frequency    Min 1X/week           Co-evaluation PT/OT/SLP Co-Evaluation/Treatment: Yes Reason for Co-Treatment: Necessary to address cognition/behavior during functional activity;For  patient/therapist safety PT goals addressed during session: Mobility/safety with mobility;Balance;Proper use of DME OT goals addressed during session: ADL's and self-care;Proper use of Adaptive equipment and DME      AM-PAC PT "6 Clicks" Mobility   Outcome Measure  Help needed turning from your back to your side while in a flat bed without using bedrails?: None Help needed moving from lying on your back to sitting on the side of a flat bed without using bedrails?: A Little Help needed moving to and from a bed to a chair (including a wheelchair)?: A Lot Help needed standing up from a chair using your arms (e.g., wheelchair or bedside chair)?: A Little Help needed to walk in hospital room?: A Lot Help needed climbing 3-5 steps with a railing? : Total 6 Click Score: 15    End of Session Equipment Utilized During Treatment: Gait belt Activity Tolerance: Patient tolerated treatment well Patient left: in chair;with chair alarm set;with call bell/phone within reach;with nursing/sitter in room Nurse Communication: Mobility status PT Visit Diagnosis: Unsteadiness on feet (R26.81);Other abnormalities of gait and mobility (R26.89)     Time: 1610-9604 PT Time Calculation (min) (ACUTE ONLY): 28 min  Charges:    $Gait Training: 8-22 mins $Therapeutic Activity: 8-22 mins PT General Charges $$ ACUTE PT VISIT: 1 Visit                     Johny Shock, PTA Acute Rehabilitation Services Secure Chat Preferred  [phone]    Johny Shock 03/17/2023, 2:51 PM

## 2023-03-18 DIAGNOSIS — F10931 Alcohol use, unspecified with withdrawal delirium: Secondary | ICD-10-CM | POA: Diagnosis not present

## 2023-03-18 LAB — CBC WITH DIFFERENTIAL/PLATELET
Abs Immature Granulocytes: 0.01 10*3/uL (ref 0.00–0.07)
Basophils Absolute: 0 10*3/uL (ref 0.0–0.1)
Basophils Relative: 1 %
Eosinophils Absolute: 0 10*3/uL (ref 0.0–0.5)
Eosinophils Relative: 1 %
HCT: 37.4 % — ABNORMAL LOW (ref 39.0–52.0)
Hemoglobin: 12.8 g/dL — ABNORMAL LOW (ref 13.0–17.0)
Immature Granulocytes: 0 %
Lymphocytes Relative: 28 %
Lymphs Abs: 1.2 10*3/uL (ref 0.7–4.0)
MCH: 32.7 pg (ref 26.0–34.0)
MCHC: 34.2 g/dL (ref 30.0–36.0)
MCV: 95.7 fL (ref 80.0–100.0)
Monocytes Absolute: 0.7 10*3/uL (ref 0.1–1.0)
Monocytes Relative: 18 %
Neutro Abs: 2.3 10*3/uL (ref 1.7–7.7)
Neutrophils Relative %: 52 %
Platelets: 157 10*3/uL (ref 150–400)
RBC: 3.91 MIL/uL — ABNORMAL LOW (ref 4.22–5.81)
RDW: 12.5 % (ref 11.5–15.5)
WBC: 4.2 10*3/uL (ref 4.0–10.5)
nRBC: 0 % (ref 0.0–0.2)

## 2023-03-18 LAB — COMPREHENSIVE METABOLIC PANEL
ALT: 19 U/L (ref 0–44)
AST: 37 U/L (ref 15–41)
Albumin: 3.2 g/dL — ABNORMAL LOW (ref 3.5–5.0)
Alkaline Phosphatase: 40 U/L (ref 38–126)
Anion gap: 8 (ref 5–15)
BUN: 7 mg/dL — ABNORMAL LOW (ref 8–23)
CO2: 21 mmol/L — ABNORMAL LOW (ref 22–32)
Calcium: 9.3 mg/dL (ref 8.9–10.3)
Chloride: 103 mmol/L (ref 98–111)
Creatinine, Ser: 0.87 mg/dL (ref 0.61–1.24)
GFR, Estimated: >60 mL/min (ref >60)
Glucose, Bld: 108 mg/dL — ABNORMAL HIGH (ref 70–99)
Potassium: 4 mmol/L (ref 3.5–5.1)
Sodium: 132 mmol/L — ABNORMAL LOW (ref 135–145)
Total Bilirubin: 1 mg/dL (ref <1.2)
Total Protein: 7.3 g/dL (ref 6.5–8.1)

## 2023-03-18 LAB — MAGNESIUM: Magnesium: 1.6 mg/dL — ABNORMAL LOW (ref 1.7–2.4)

## 2023-03-18 LAB — PHOSPHORUS: Phosphorus: 3.1 mg/dL (ref 2.5–4.6)

## 2023-03-18 MED ORDER — MAGNESIUM SULFATE 4 GM/100ML IV SOLN
4.0000 g | Freq: Once | INTRAVENOUS | Status: AC
  Administered 2023-03-18: 4 g via INTRAVENOUS
  Filled 2023-03-18: qty 100

## 2023-03-18 MED ORDER — LACTATED RINGERS IV BOLUS
500.0000 mL | Freq: Once | INTRAVENOUS | Status: AC
  Administered 2023-03-18: 500 mL via INTRAVENOUS

## 2023-03-18 MED ORDER — CHLORDIAZEPOXIDE HCL 5 MG PO CAPS
10.0000 mg | ORAL_CAPSULE | Freq: Three times a day (TID) | ORAL | Status: DC
  Administered 2023-03-18 (×3): 10 mg via ORAL
  Filled 2023-03-18 (×3): qty 2

## 2023-03-18 MED ORDER — FAMOTIDINE 20 MG PO TABS
20.0000 mg | ORAL_TABLET | Freq: Every day | ORAL | Status: DC
  Administered 2023-03-18 – 2023-03-19 (×2): 20 mg via ORAL
  Filled 2023-03-18 (×2): qty 1

## 2023-03-18 MED ORDER — CLONIDINE HCL 0.1 MG PO TABS: 0.1000 mg | ORAL_TABLET | Freq: Two times a day (BID) | ORAL | Status: DC | PRN

## 2023-03-18 MED ORDER — MAGNESIUM SULFATE 2 GM/50ML IV SOLN
2.0000 g | Freq: Once | INTRAVENOUS | Status: AC
  Administered 2023-03-18: 2 g via INTRAVENOUS
  Filled 2023-03-18: qty 50

## 2023-03-18 NOTE — TOC Progression Note (Addendum)
Transition of Care [facility]) - Progression Note    Patient Details  Name: Steve Kelly MRN: [id number] Date of Birth: March 05, 1956  [facility] CM/SW Contact  Mearl Latin, LCSW Phone Number: 03/18/2023, 2:43 PM  Clinical Narrative:    2:43 PM-CSW received choice from patient's son for [facility] as it is close to where he lives. [facility] able to accept patient tomorrow but it would be a semi-private room. CSW alerted son to see if he wants to move forward.   4:37 PM-Son reported agreement with semi-private room. [facility] informed.      Barriers to Discharge: Continued Medical Work up  Expected Discharge Plan and Services In-house Referral: Clinical Social Work                                             Social Determinants of Health (SDOH) Interventions SDOH Screenings   Food Insecurity: Patient Unable To Answer (03/15/2023)  Housing: Patient Unable To Answer (03/15/2023)  Transportation Needs: Patient Unable To Answer (03/15/2023)  Utilities: Patient Unable To Answer (03/15/2023)  Tobacco Use: Low Risk  (03/14/2023)    Readmission Risk Interventions     No data to display

## 2023-03-18 NOTE — Plan of Care (Signed)
  Problem: Education: Goal: Knowledge of General Education information will improve Description: Including pain rating scale, medication(s)/side effects and non-pharmacologic comfort measures Outcome: Progressing   Problem: Health Behavior/Discharge Planning: Goal: Ability to manage health-related needs will improve Outcome: Progressing   Problem: Clinical Measurements: Goal: Ability to maintain clinical measurements within normal limits will improve Outcome: Progressing Goal: Will remain free from infection Outcome: Progressing Goal: Diagnostic test results will improve Outcome: Progressing Goal: Respiratory complications will improve Outcome: Progressing Goal: Cardiovascular complication will be avoided Outcome: Progressing   Problem: Activity: Goal: Risk for activity intolerance will decrease Outcome: Progressing   Problem: Nutrition: Goal: Adequate nutrition will be maintained Outcome: Progressing   Problem: Coping: Goal: Level of anxiety will decrease Outcome: Progressing   Problem: Elimination: Goal: Will not experience complications related to bowel motility Outcome: Progressing Goal: Will not experience complications related to urinary retention Outcome: Progressing   Problem: Pain Management: Goal: General experience of comfort will improve Outcome: Progressing   Problem: Safety: Goal: Ability to remain free from injury will improve Outcome: Progressing   Problem: Skin Integrity: Goal: Risk for impaired skin integrity will decrease Outcome: Progressing   Problem: Education: Goal: Knowledge of disease or condition will improve Outcome: Progressing Goal: Understanding of discharge needs will improve Outcome: Progressing   Problem: Health Behavior/Discharge Planning: Goal: Ability to identify changes in lifestyle to reduce recurrence of condition will improve Outcome: Progressing Goal: Identification of resources available to assist in meeting health  care needs will improve Outcome: Progressing   Problem: Physical Regulation: Goal: Complications related to the disease process, condition or treatment will be avoided or minimized Outcome: Progressing   Problem: Safety: Goal: Ability to remain free from injury will improve Outcome: Progressing

## 2023-03-18 NOTE — Progress Notes (Signed)
PROGRESS NOTE                                                                                                                                                                                                             Patient Demographics:    Steve Kelly, is a 67 y.o. male, DOB - January 27, 1956, VOZ:[id number]  Outpatient Primary MD for the patient is Steve Parents, FNP    LOS - 4  Admit date - 03/14/2023    Chief Complaint  Patient presents with   Altered Mental Status       Brief Narrative (HPI from H&P)    67 y.o. male with medical history significant of alcohol use disorder, HTN and vitamin D deficiency who presents to the ED with altered mental status.  According to family, patient or his at baseline 3 days ago. Today, he appeared weak and confused with difficulty ambulating. He has had multiple falls in the last month. During my evaluation, patient in mittens and still disoriented. Speech is occasionally incomprehensible.  Marland Kitchen He reports drinking a pint of liquor a day and has been drinking since he was 67 years old. He reports his last drink was yesterday, in the ER he was diagnosed with severe DTs and admitted to the hospital.   Subjective:   Patient in bed, appears comfortable, denies any headache, no fever, no chest pain or pressure, no shortness of breath , no abdominal pain. No focal weakness.   Assessment  & Plan :   Severe acute metabolic encephalopathy due to DTs from alcohol withdrawal. He has longstanding history of heavy alcohol abuse, has had multiple falls recently at home, came to the ER with altered mental status and rapidly went into severe DTs, questionable seizure-like activity in the ER, head CT and CT C-spine nonacute. No fever or leukocytosis, has been placed on Librium along with CIWA protocol, start tapering Librium and benzodiazepines, on high-dose IV thiamine, D5 drip for 24 hours, low-dose  aspirin, PT, speech eval, EEG stable, counseled to abstain from alcohol monitor.  Thrombocytopenia with fatty liver on ultrasound.  Likely due to #1 above.  Monitor.  Hypertension.  PO Beta-blocker, add Norvasc for better control and continue needed IV Lopressor.  Hypokalemia, hypomagnesemia, vitamin D deficiency.  Replaced.  AKI.  Hydrate and monitor.  Follow bladder scans.  Mild Rhabdomyolysis.  Hydrated.      Condition - Extremely Guarded  Family Communication  :    Provided contact  Steve Kelly [phone] as the contact person is the wrong contact person, Steve Kelly responded to the given phone number and claims she does not know Mr. Steve Kelly.  He was called 03/15/2023 at 7:25 AM  Code Status : Full code  Consults  : None  PUD Prophylaxis : PPI   Procedures  :     EEG - This study is within normal limits. No seizures or epileptiform discharges were seen throughout the recording. A normal interictal EEG does not exclude the diagnosis of epilepsy.  CT head & C spine - 1. No acute intracranial pathology. 2. No acute fracture or traumatic malalignment of the cervical spine. 3. Advanced multilevel degenerative changes as above  [place] -  Hepatic steatosis with focal fatty sparing.       Disposition Plan  :    Status is: Inpatient  DVT Prophylaxis  :  Heparinheparin injection 5,000 Units Start: 03/15/23 1400    Lab Results  Component Value Date   PLT 157 03/18/2023    Diet :  Diet Order             DIET DYS 3 Room service appropriate? Yes with Assist; Fluid consistency: Thin  Diet effective now                    Inpatient Medications  Scheduled Meds:  amLODipine  10 mg Oral Daily   aspirin  81 mg Oral Daily   atenolol  50 mg Oral Daily   chlordiazePOXIDE  10 mg Oral TID   cloNIDine  0.2 mg Oral TID   famotidine  20 mg Oral Daily   folic acid  1 mg Oral Daily   heparin injection (subcutaneous)  5,000 Units Subcutaneous Q8H   multivitamin with minerals   1 tablet Oral Daily   thiamine  100 mg Oral Daily   Vitamin D (Ergocalciferol)  50,000 Units Oral Q7 days   Continuous Infusions:  magnesium sulfate bolus IVPB     magnesium sulfate bolus IVPB     PRN Meds:.acetaminophen **OR** acetaminophen, labetalol, [DISCONTINUED] ondansetron **OR** ondansetron (ZOFRAN) IV, senna-docusate  Antibiotics  :    Anti-infectives (From admission, onward)    None         Objective:   Vitals:   03/17/23 2135 03/18/23 0000 03/18/23 0400 03/18/23 0824  BP: 137/88 (!) 156/123 (!) 149/81 (!) 137/111  Pulse:  77 71 74  Resp:  19 18 14   Temp:  97.9 F (36.6 C) 100.3 F (37.9 C) 99.7 F (37.6 C)  TempSrc:  Axillary Oral Oral  SpO2:  98% 96% 95%  Weight:      Height:        Wt Readings from Last 3 Encounters:  03/14/23 68 kg     Intake/Output Summary (Last 24 hours) at 03/18/2023 0840 Last data filed at 03/17/2023 1825 Gross per 24 hour  Intake 247.17 ml  Output 800 ml  Net -552.83 ml     Physical Exam  Awake, more alert today, following all commands, no focal deficits [place].AT,PERRAL Supple Neck, No JVD,   Symmetrical Chest wall movement, Good air movement bilaterally, CTAB RRR,No Gallops,Rubs or new Murmurs,  +ve B.Sounds, Abd Soft, No tenderness,   No Cyanosis, Clubbing or edema      Data Review:    Recent Labs  Lab 03/14/23 1136 03/15/23 0814 03/16/23 0319  03/17/23 0333 03/18/23 0551  WBC 4.5 3.4* 3.7* 4.3 4.2  HGB 12.1* 11.0* 10.9* 10.8* 12.8*  HCT 35.4* 31.7* 31.4* 31.5* 37.4*  PLT 100* 115* 118* 146* 157  MCV 99.7 97.2 95.7 96.0 95.7  MCH 34.1* 33.7 33.2 32.9 32.7  MCHC 34.2 34.7 34.7 34.3 34.2  RDW 13.1 13.0 12.6 12.3 12.5  LYMPHSABS  --  0.9 1.2 1.4 1.2  MONOABS  --  0.7 0.8 0.9 0.7  EOSABS  --  0.0 0.0 0.1 0.0  BASOSABS  --  0.0 0.0 0.0 0.0    Recent Labs  Lab 03/14/23 1130 03/14/23 1136 03/14/23 1503 03/15/23 0814 03/16/23 0319 03/17/23 0333 03/18/23 0551  NA  --  139  --  139 135 136 132*  K   --  3.2*  --  3.1* 3.1* 3.6 4.0  CL  --  99  --  102 103 104 103  CO2  --  16*  --  20* 22 23 21*  ANIONGAP  --  24*  --  17* 10 9 8   GLUCOSE  --  71  --  75 115* 128* 108*  BUN  --  23  --  13 13 7* 7*  CREATININE  --  1.40*  --  1.01 0.92 0.81 0.87  AST  --  67*  --  49* 42* 34 37  ALT  --  28  --  22 21 17 19   ALKPHOS  --  40  --  37* 37* 41 40  BILITOT  --  2.7*  --  2.6* 1.5* 1.2* 1.0  ALBUMIN  --  4.0  --  3.4* 3.2* 3.0* 3.2*  INR  --   --  1.1  --   --   --   --   AMMONIA 26  --   --   --   --   --   --   MG  --   --  1.5* 1.8 1.7 1.3* 1.6*  CALCIUM  --  10.1  --  9.6 9.1 9.1 9.3      Recent Labs  Lab 03/14/23 1130 03/14/23 1136 03/14/23 1503 03/15/23 0814 03/16/23 0319 03/17/23 0333 03/18/23 0551  INR  --   --  1.1  --   --   --   --   AMMONIA 26  --   --   --   --   --   --   MG  --   --  1.5* 1.8 1.7 1.3* 1.6*  CALCIUM  --  10.1  --  9.6 9.1 9.1 9.3   No results for input(s): "VITAMINB12", "FOLATE", "FERRITIN", "TIBC", "IRON", "RETICCTPCT" in the last 72 hours.  Radiology Reports EEG adult  Result Date: 03/15/2023 Charlsie Quest, MD     03/15/2023 12:45 PM Patient Name: OMID DEARDORFF MRN: [id number] Epilepsy Attending: Charlsie Quest Referring Physician/Provider: Steffanie Rainwater, MD Date: 03/15/2023 Duration: 23.48 mins Patient history: 67 y.o. male with medical history significant of alcohol use disorder, HTN and vitamin D deficiency admitted for alcohol withdrawal. EEG to evaluate for seizure Level of alertness: Awake AEDs during EEG study: Chlordiazepoxide Technical aspects: This EEG study was done with scalp electrodes positioned according to the 10-20 International system of electrode placement. Electrical activity was reviewed with band pass filter of 1-70Hz , sensitivity of 7 uV/mm, display speed of 69mm/sec with a 60Hz  notched filter applied as appropriate. EEG data were recorded continuously and digitally stored.  Video monitoring was  available and  reviewed as appropriate. Description: The posterior dominant rhythm consists of 9-10 Hz activity of moderate voltage (25-35 uV) seen predominantly in posterior head regions, symmetric and reactive to eye opening and eye closing. Hyperventilation and photic stimulation were not performed.   IMPRESSION: This study is within normal limits. No seizures or epileptiform discharges were seen throughout the recording. A normal interictal EEG does not exclude the diagnosis of epilepsy. Priyanka Annabelle Harman   US Abdomen Limited RUQ (LIVER/GB)  Result Date: 03/14/2023 CLINICAL DATA:  Thrombocytopenia, elevated LFTs EXAM: ULTRASOUND ABDOMEN LIMITED RIGHT UPPER QUADRANT COMPARISON:  None Available. FINDINGS: Gallbladder: No gallstones or wall thickening visualized. No sonographic Murphy sign noted by sonographer. Common bile duct: Diameter: 4 mm Liver: Hyperechoic hepatic parenchyma, suggesting hepatic steatosis, with focal fatty sparing along the gallbladder fossa. Portal vein is patent on color Doppler imaging with normal direction of blood flow towards the liver. Other: None. IMPRESSION: Hepatic steatosis with focal fatty sparing. Electronically Signed   By: Charline Bills M.D.   On: 03/14/2023 19:01   CT Head Wo Contrast  Result Date: 03/14/2023 CLINICAL DATA:  Head trauma, pain EXAM: CT HEAD WITHOUT CONTRAST CT CERVICAL SPINE WITHOUT CONTRAST TECHNIQUE: Multidetector CT imaging of the head and cervical spine was performed following the standard protocol without intravenous contrast. Multiplanar CT image reconstructions of the cervical spine were also generated. RADIATION DOSE REDUCTION: This exam was performed according to the departmental dose-optimization program which includes automated exposure control, adjustment of the mA and/or kV according to patient size and/or use of iterative reconstruction technique. COMPARISON:  CT head and cervical spine 01/16/2017 FINDINGS: CT HEAD FINDINGS Brain: There is no acute  intracranial hemorrhage, extra-axial fluid collection, or acute infarct. Parenchymal volume is normal. The ventricles are normal in size. Gray-white differentiation is preserved. Hypodensity in the supratentorial white matter likely reflects underlying chronic small-vessel ischemic change. 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 The pituitary and suprasellar region are normal. There is no mass lesion. There is no mass effect or midline shift. Vascular: No hyperdense vessel or unexpected calcification. Skull: Normal. Negative for fracture or focal lesion. Sinuses/Orbits: The imaged paranasal sinuses are clear. The globes and orbits are unremarkable. Other: The mastoid air cells and middle ear cavities are clear. CT CERVICAL SPINE FINDINGS Alignment: There is mild degenerative anterolisthesis of C2 on C3. There is no evidence of traumatic malalignment. Skull base and vertebrae: Skull base alignment is maintained. Vertebral body heights are preserved. There is no evidence of acute fracture. There is no suspicious osseous lesion. Soft tissues and spinal canal: No prevertebral fluid or swelling. No visible canal hematoma. Disc levels: There is severe multilevel disc space narrowing and degenerative endplate change with prominent anterior osteophytes throughout the cervical spine. There is osseous fusion across the C5-C6 disc space. There is severe left facet arthropathy at C2-C3. Upper chest: The imaged lung apices are clear. Other: None. IMPRESSION: 1. No acute intracranial pathology. 2. No acute fracture or traumatic malalignment of the cervical spine. 3. Advanced multilevel degenerative changes as above. Electronically Signed   By: Lesia Hausen M.D.   On: 03/14/2023 12:58   CT Cervical Spine Wo Contrast  Result Date: 03/14/2023 CLINICAL DATA:  Head trauma, pain EXAM: CT HEAD WITHOUT CONTRAST CT CERVICAL SPINE WITHOUT CONTRAST TECHNIQUE: Multidetector CT imaging of the head and cervical  spine was performed following the standard protocol without intravenous contrast. Multiplanar CT image reconstructions of the cervical spine were also generated. RADIATION  DOSE REDUCTION: This exam was performed according to the departmental dose-optimization program which includes automated exposure control, adjustment of the mA and/or kV according to patient size and/or use of iterative reconstruction technique. COMPARISON:  CT head and cervical spine 01/16/2017 FINDINGS: CT HEAD FINDINGS Brain: There is no acute intracranial hemorrhage, extra-axial fluid collection, or acute infarct. Parenchymal volume is normal. The ventricles are normal in size. Gray-white differentiation is preserved. Hypodensity in the supratentorial white matter likely reflects underlying chronic small-vessel ischemic change. 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 The pituitary and suprasellar region are normal. There is no mass lesion. There is no mass effect or midline shift. Vascular: No hyperdense vessel or unexpected calcification. Skull: Normal. Negative for fracture or focal lesion. Sinuses/Orbits: The imaged paranasal sinuses are clear. The globes and orbits are unremarkable. Other: The mastoid air cells and middle ear cavities are clear. CT CERVICAL SPINE FINDINGS Alignment: There is mild degenerative anterolisthesis of C2 on C3. There is no evidence of traumatic malalignment. Skull base and vertebrae: Skull base alignment is maintained. Vertebral body heights are preserved. There is no evidence of acute fracture. There is no suspicious osseous lesion. Soft tissues and spinal canal: No prevertebral fluid or swelling. No visible canal hematoma. Disc levels: There is severe multilevel disc space narrowing and degenerative endplate change with prominent anterior osteophytes throughout the cervical spine. There is osseous fusion across the C5-C6 disc space. There is severe left facet arthropathy at C2-C3.  Upper chest: The imaged lung apices are clear. Other: None. IMPRESSION: 1. No acute intracranial pathology. 2. No acute fracture or traumatic malalignment of the cervical spine. 3. Advanced multilevel degenerative changes as above. Electronically Signed   By: Lesia Hausen M.D.   On: 03/14/2023 12:58   DG Knee Complete 4 Views Left  Result Date: 03/14/2023 CLINICAL DATA:  Fall, pain EXAM: LEFT KNEE - COMPLETE 4+ VIEW COMPARISON:  None Available. FINDINGS: There is no acute fracture or dislocation. Knee alignment is normal. There is moderate medial compartment joint space narrowing with associated subchondral sclerosis and osteophytosis. There is no erosive change. There is no significant suprapatellar effusion, though positioning is suboptimal on the lateral projection. IMPRESSION: No acute finding. Moderate medial compartment joint space narrowing. Electronically Signed   By: Lesia Hausen M.D.   On: 03/14/2023 11:56      Signature  -   Susa Raring M.D on 03/18/2023 at 8:40 AM   -  To page go to [email or web address]

## 2023-03-18 NOTE — TOC Progression Note (Signed)
Transition of Care [facility]) - Progression Note    Patient Details  Name: Steve Kelly MRN: [id number] Date of Birth: 06-Apr-1956  Transition of Care [facility] CM/SW Contact  Suzann Lazaro Reeves Forth, Student-Social Work Phone Number: 03/18/2023, 1:38 PM  Clinical Narrative:    MSW Intern called pt's son about SNF offers once his father is discharged. Pt's son provided MSW Intern with his email so MSW Intern could email him the list of SNF offers.     Barriers to Discharge: Continued Medical Work up  Expected Discharge Plan and Services In-house Referral: Clinical Social Work                                             Social Determinants of Health (SDOH) Interventions SDOH Screenings   Food Insecurity: Patient Unable To Answer (03/15/2023)  Housing: Patient Unable To Answer (03/15/2023)  Transportation Needs: Patient Unable To Answer (03/15/2023)  Utilities: Patient Unable To Answer (03/15/2023)  Tobacco Use: Low Risk  (03/14/2023)    Readmission Risk Interventions     No data to display

## 2023-03-18 NOTE — Discharge Instructions (Addendum)
In a time of Crisis: [facility]  Mobile Crisis Management provides immediate crisis response, 24/7.  Call [phone]  [facility] for MH/DD/SA [facility] is available 24 hours a day, 7 days a week. Customer Service Specialists will assist you to find a crisis provider that is well-matched with your needs. Your local number is: [phone]  [facility] ([facility]) IOP, individual counseling, medication management [address] [place], [place] [address] [phone] Call for intake hours; Medicaid and Uninsured    Outpatient Providers  [facility] ([facility]) Group and individual counseling. [address]  [place], [place] [address] [phone] [place]: [phone]  [place]: [phone] Medicaid and uninsured.   [facility] Offers IOP groups multiple times per week. [address] [place], [place] [address] [phone] Takes Medicaid and other insurances.   [name]  Chemical Dependency Intensive Outpatient Program (IOP) [address] [place], [place] [address] [phone] Takes Nurse, learning disability and [place].   [facility]  IOP and Partial Hospitalization Program  [address]  [place], [place] [address] [phone] Private Insurance, [place] only for partial hospitalization  [facility] [address], [place], [place] [address] [phone] Monday-Friday. Short and Long term options. [facility] ([facility]) IOP, individual counseling, medication management [address] [place], [place] [address] [phone] Medicaid and [facility]  [facility] [address]  [place], [place] [address] [phone] Private Insurance and Self Pay   [facility] [address]  [place], [place] [address] [phone] Private Insurance, [place], and Self Pay   [facility]  [address] [place], [place] [address] [facility]  [address]  [place], [place] [address] [phone]  [facility]  [address] [place], [place] [address] [phone]  [facility] [phone] [facility]      Residential Treatment Programs  [facility] [address] [place], [place] [address] [phone] or [phone] Detox and Residential Rehab 14 days (Medicare, Medicaid, private insurance, and self pay). No methadone. Call for pre-screen.   [facility]) [address]  [place], [place] [address] [phone] Detox (self Pay and Medicaid Limited availability) Rehab Only Male (Medicare, Medicaid, and Self Pay)-No methadone.  [facility] [address] [place], [place] [address] [phone] or [phone] Private Insurance only  [facility] [address] [place], [place] [address] Phone:  [phone] Must be detoxed 72 hours prior to admission; 28 day program.  Self-pay.  [facility] [address]  [place], [place] [phone] [employer], Medicare, [place] (not straight [place]). They offer assistance with transportation.   [facility] [address] [place],  [place], [place] [address] [phone] Christian Based Program. Men only. No insurance  [facility] [address] [place], [place] [address] Women's: [phone] Men's: [phone] No Medicaid.   [facility] [place] Locations across the [place] (mainly [place]) willing to help with transportation.  [phone] [employer]. [facility]  [address]  [place], [place] [address] [phone] Treatment Only, must make  assessment appointment, and must be sober for assessment appointment. Self pay, Medicare A and B, [facility],  must be [facility] resident. No methadone.   [address] [place], [place] [address] Phone: [phone] Inpatient 24/7 and outpatient services. Individuals with Medicaid have no obligation for a copay. Individuals with Medicare or private insurance will be obligated to meet their policy's requirement(s). Individuals who are uninsured will be eligible for a sliding or discounted scaled  [facility]  [address] [place], [place] [address] [phone] No pending legal charges, Long-term work program. No methadone. Call for assessment.  [facility]  [address], [place], [place] [address] [phone] or [phone] Commercial Insurance Only  [facility] - [phone] Private Insurance (no [place]). Males/Females, call to make referrals, multiple facilities   [facility] [address]  [place], [place] [address]  [phone] Men Only Upfront Fee   [facility]one [facility] [address] [place], [place] [address] [phone] (f)         AA Meetings Website to locate meetings (virtually or in person): [email or web address].[email or web address].biz/ Phone: [phone]  Syringe Services Program: Due to COVID-19, syringe services programs are likely operating under different hours with limited or no fixed site hours. Some programs may not be operating at all. Please contact the program directly using the phone numbers provided below to see if they are still operating under COVID-19.  [facility] Solution to the Opioid Problem ([facility]) Fixed; mobile; peer-based Roxy Cedar [phone] [email or web address] Fixed site exchange at [facility], [address] [place], [place] [address] on Wednesdays (2:00 - 5:00 pm) and Thursdays  (4:00 - 8:00 pm). Pop-up mobile exchange locations: [employer], [address] [place], [place] [address] on Tuesdays (11:00 am - 1:00 pm) and Fridays (11:00 am - 1:00 pm)  -[facility] - [address], [place], [place] [address] on Tuesdays (2:00 - 4:00 pm) and Fridays (2:00 - 4:00 pm)  -[facility] copy - also serves Radio broadcast assistant and [employer] [place] [employer]; Fixed; mobile; peer-based; Lendon Ka [phone] [email or web address] [address] [place], [place] [address] Delivery and outreach available in [place] and [place], please call for more information. Monday, Tuesday: 1:00 -7:00 pm, Thursday: 4:00 pm - 8:00 pm or Friday: 1:00 pm - 8:00 pm  Medication-Assisted Treatment (MAT):  -[facility] services [address] and surrounding areas including [place], [place], [place], [place], [address], [place], [place], [place], [place], and [place], [place]. Options include Methadone, buprenorphine or Suboxone. [address] [place], [place] [address] Phone: [phone] Mon - Fri: 5:30am - 2:00pm Sat: 5:30am -7:30am Sun: Closed Holidays: 6:00am - 8:00am  -[facility]- We use FDA-approved medications, like methadone/suboxone/sublocade, and vivitrol. These medications are then combined with customized care plans that include individual or group counseling, toxicology, and medical care directed by on-site physicians. Accepts most insurance plans, Medicaid, and private pay.  [address] [place], [place] [address] Phone: [phone] Monday-Friday 5:00 AM - 10:00 AM Saturday 6:00 AM - 8:30 AM Sunday 6:00 AM - 7:00 AM  -[facility]- [facility] is a treatment & recovery focused program. In addition to receiving methadone medication, our clients participate in individual and group counseling as well as random drug testing. If accepted into the [facility] Opioid Program, you  will be provided several intake appointments and a physical exam [address] [place], [place] [address] Office: [phone]  Fax: [phone]  -[facility]- We put our community members at the  center of everything we do, for remote treatment services as well as in-person, from alcohol withdrawal to opioid use and more.  [address], [place], [place] [address] [phone] Monday-Wednesday: 9:00am - 5:00pm Thursday: 9:00am - 6:00pm Friday: 9:00am - 5:00pm Saturday: 9:00am - 1:00pm Sunday: Closed  -[facility] [place]) [address], [place], [place] [address] [phone]  [place] [phone] [address] [place], [place] [address]  M-W    5:00am-12:00pm Thu     5:00am-10:00am Fri       5:00am-12:00pm Sat      5:00am-8:00am Sun     Closed  $12/daily for Methadone Treatment.    Do not drive, operate heavy machinery, perform activities at heights, swimming or participation in water activities or provide baby sitting services until you have seen by Primary MD or a Neurologist and advised to do so again.  Follow with Primary MD Steve Parents, FNP in 7 days   Get CBC, CMP, [place], 2 view Chest X ray -  checked next visit with your primary MD or SNF MD    Activity: As tolerated with Full fall precautions use walker/cane & assistance as needed  Disposition SNF  Diet: Dysphagia 3 diet with feeding assistance and aspiration precautions.   Special Instructions: If you have smoked or chewed Tobacco  in the last 2 yrs please stop smoking, stop any regular Alcohol  and or any Recreational drug use.  On your next visit with your primary care physician please Get Medicines reviewed and adjusted.  Please request your Prim.MD to go over all Hospital Tests and Procedure/Radiological results at the follow up, please get all Hospital records sent to your Prim MD by signing hospital release before you go home.  If  you experience worsening of your admission symptoms, develop shortness of breath, life threatening emergency, suicidal or homicidal thoughts you must seek medical attention immediately by calling 911 or calling your MD immediately  if symptoms less severe.  You Must read complete instructions/literature along with all the possible adverse reactions/side effects for all the Medicines you take and that have been prescribed to you. Take any new Medicines after you have completely understood and accpet all the possible adverse reactions/side effects.   Do not drive when taking Pain medications.  Do not take more than prescribed Pain, Sleep and Anxiety Medications

## 2023-03-18 NOTE — [place] FL2 (Signed)
[place] MEDICAID FL2 LEVEL OF CARE FORM     IDENTIFICATION  Patient Name: Steve Kelly Birthdate: Jun 18, 1955 Sex: male Admission Date (Current Location): 03/14/2023  [facility] and [place] Number:  Producer, television/film/video and Address:  [facility] [place]. [facility], [address] [place], [place] [address]      Provider Number: [id number]  Attending Physician Name and Address:  Leroy Sea, MD  Relative Name and Phone Number:       Current Level of Care: Hospital Recommended Level of Care: Skilled Nursing Facility Prior Approval Number:    Date Approved/Denied:   PASRR Number: [id number]  Discharge Plan: SNF    Current Diagnoses: Patient Active Problem List   Diagnosis Date Noted   Alcohol withdrawal (HCC) 03/14/2023   Acute alteration in mental status 03/14/2023   Generalized weakness 03/14/2023   History of alcohol use disorder 03/14/2023   Thrombocytopenia (HCC) 03/14/2023   Hypokalemia 03/14/2023   Alcohol abuse 10/09/2022   Heart murmur on physical examination 10/09/2022   Hypertension 10/09/2022   Tremor 10/09/2022   Vitamin D deficiency 10/09/2022    Orientation RESPIRATION BLADDER Height & Weight     Situation, Self  Normal Incontinent Weight: 150 lb (68 kg) Height:  5\' 9"  (175.3 cm)  BEHAVIORAL SYMPTOMS/MOOD NEUROLOGICAL BOWEL NUTRITION STATUS      Incontinent Diet (See DC Summary)  AMBULATORY STATUS COMMUNICATION OF NEEDS Skin   Limited Assist Verbally Normal                       Personal Care Assistance Level of Assistance  Bathing, Dressing Bathing Assistance: Limited assistance   Dressing Assistance: Limited assistance     Functional Limitations Info  Speech     Speech Info: Impaired    SPECIAL CARE FACTORS FREQUENCY  PT (By licensed PT), OT (By licensed OT)     PT Frequency: 5X/week OT Frequency: 5X/week            Contractures Contractures Info: Not present    Additional Factors Info  Allergies,  Code Status Code Status Info: Full Allergies Info: NKA           Current Medications (03/18/2023):  This is the current hospital active medication list Current Facility-Administered Medications  Medication Dose Route Frequency Provider Last Rate Last Admin   acetaminophen (TYLENOL) tablet 650 mg  650 mg Oral Q6H PRN Steffanie Rainwater, MD   650 mg at 03/18/23 7253   Or   acetaminophen (TYLENOL) suppository 650 mg  650 mg Rectal Q6H PRN Steffanie Rainwater, MD       aspirin chewable tablet 81 mg  81 mg Oral Daily Leroy Sea, MD   81 mg at 03/18/23 0933   atenolol (TENORMIN) tablet 50 mg  50 mg Oral Daily Steffanie Rainwater, MD   50 mg at 03/17/23 0820   chlordiazePOXIDE (LIBRIUM) capsule 10 mg  10 mg Oral TID Leroy Sea, MD   10 mg at 03/18/23 0931   cloNIDine (CATAPRES) tablet 0.1 mg  0.1 mg Oral BID PRN Leroy Sea, MD       famotidine (PEPCID) tablet 20 mg  20 mg Oral Daily Leroy Sea, MD   20 mg at 03/18/23 0931   folic acid (FOLVITE) tablet 1 mg  1 mg Oral Daily Steffanie Rainwater, MD   1 mg at 03/18/23 0931   heparin injection 5,000 Units  5,000 Units Subcutaneous Q8H Susa Raring  K, MD   5,000 Units at 03/18/23 0617   labetalol (NORMODYNE) injection 10 mg  10 mg Intravenous Q2H PRN Steffanie Rainwater, MD       lactated ringers bolus 500 mL  500 mL Intravenous Once Leroy Sea, MD       magnesium sulfate IVPB 2 g 50 mL  2 g Intravenous Once Leroy Sea, MD       magnesium sulfate IVPB 4 g 100 mL  4 g Intravenous Once Leroy Sea, MD 50 mL/hr at 03/18/23 0940 4 g at 03/18/23 0940   multivitamin with minerals tablet 1 tablet  1 tablet Oral Daily Steffanie Rainwater, MD   1 tablet at 03/18/23 0931   ondansetron (ZOFRAN) injection 4 mg  4 mg Intravenous Q6H PRN Steffanie Rainwater, MD       senna-docusate (Senokot-S) tablet 1 tablet  1 tablet Oral QHS PRN Steffanie Rainwater, MD       thiamine (VITAMIN B1) tablet 100 mg  100 mg Oral Daily  Leroy Sea, MD   100 mg at 03/18/23 0931   Vitamin D (Ergocalciferol) (DRISDOL) 1.25 MG (50000 UNIT) capsule 50,000 Units  50,000 Units Oral Q7 days Steffanie Rainwater, MD         Discharge Medications: Please see discharge summary for a list of discharge medications.  Relevant Imaging Results:  Relevant Lab Results:   Additional Information SSN:[id number]  Mearl Latin, LCSW

## 2023-03-19 DIAGNOSIS — F10931 Alcohol use, unspecified with withdrawal delirium: Secondary | ICD-10-CM | POA: Diagnosis not present

## 2023-03-19 LAB — CBC WITH DIFFERENTIAL/PLATELET
Abs Immature Granulocytes: 0.01 10*3/uL (ref 0.00–0.07)
Basophils Absolute: 0 10*3/uL (ref 0.0–0.1)
Basophils Relative: 1 %
Eosinophils Absolute: 0 10*3/uL (ref 0.0–0.5)
Eosinophils Relative: 1 %
HCT: 32.4 % — ABNORMAL LOW (ref 39.0–52.0)
Hemoglobin: 11.4 g/dL — ABNORMAL LOW (ref 13.0–17.0)
Immature Granulocytes: 0 %
Lymphocytes Relative: 28 %
Lymphs Abs: 1.4 10*3/uL (ref 0.7–4.0)
MCH: 33.8 pg (ref 26.0–34.0)
MCHC: 35.2 g/dL (ref 30.0–36.0)
MCV: 96.1 fL (ref 80.0–100.0)
Monocytes Absolute: 1.1 10*3/uL — ABNORMAL HIGH (ref 0.1–1.0)
Monocytes Relative: 22 %
Neutro Abs: 2.4 10*3/uL (ref 1.7–7.7)
Neutrophils Relative %: 48 %
Platelets: 200 10*3/uL (ref 150–400)
RBC: 3.37 MIL/uL — ABNORMAL LOW (ref 4.22–5.81)
RDW: 12.7 % (ref 11.5–15.5)
WBC: 5 10*3/uL (ref 4.0–10.5)
nRBC: 0 % (ref 0.0–0.2)

## 2023-03-19 LAB — PHOSPHORUS: Phosphorus: 3.2 mg/dL (ref 2.5–4.6)

## 2023-03-19 LAB — COMPREHENSIVE METABOLIC PANEL
ALT: 23 U/L (ref 0–44)
AST: 49 U/L — ABNORMAL HIGH (ref 15–41)
Albumin: 2.9 g/dL — ABNORMAL LOW (ref 3.5–5.0)
Alkaline Phosphatase: 38 U/L (ref 38–126)
Anion gap: 10 (ref 5–15)
BUN: 9 mg/dL (ref 8–23)
CO2: 22 mmol/L (ref 22–32)
Calcium: 9 mg/dL (ref 8.9–10.3)
Chloride: 103 mmol/L (ref 98–111)
Creatinine, Ser: 0.94 mg/dL (ref 0.61–1.24)
GFR, Estimated: >60 mL/min (ref >60)
Glucose, Bld: 109 mg/dL — ABNORMAL HIGH (ref 70–99)
Potassium: 4.1 mmol/L (ref 3.5–5.1)
Sodium: 135 mmol/L (ref 135–145)
Total Bilirubin: 0.9 mg/dL (ref <1.2)
Total Protein: 6.7 g/dL (ref 6.5–8.1)

## 2023-03-19 LAB — MAGNESIUM: Magnesium: 2 mg/dL (ref 1.7–2.4)

## 2023-03-19 MED ORDER — CHLORDIAZEPOXIDE HCL 5 MG PO CAPS: ORAL_CAPSULE | ORAL | 0 refills | Status: DC

## 2023-03-19 MED ORDER — VITAMIN D (ERGOCALCIFEROL) 1.25 MG (50000 UNIT) PO CAPS: 50000.0000 [IU] | ORAL_CAPSULE | ORAL | Status: DC

## 2023-03-19 MED ORDER — ASPIRIN 81 MG PO CHEW: 81.0000 mg | CHEWABLE_TABLET | Freq: Every day | ORAL | Status: DC

## 2023-03-19 MED ORDER — VITAMIN B-1 100 MG PO TABS: 100.0000 mg | ORAL_TABLET | Freq: Every day | ORAL | Status: DC

## 2023-03-19 MED ORDER — FOLIC ACID 1 MG PO TABS: 1.0000 mg | ORAL_TABLET | Freq: Every day | ORAL | Status: DC

## 2023-03-19 MED ORDER — CHLORDIAZEPOXIDE HCL 5 MG PO CAPS
5.0000 mg | ORAL_CAPSULE | Freq: Three times a day (TID) | ORAL | Status: DC
  Administered 2023-03-19: 5 mg via ORAL
  Filled 2023-03-19: qty 1

## 2023-03-19 MED ORDER — CHLORDIAZEPOXIDE HCL 5 MG PO CAPS: 5.0000 mg | ORAL_CAPSULE | Freq: Two times a day (BID) | ORAL | Status: DC

## 2023-03-19 MED ORDER — FAMOTIDINE 20 MG PO TABS: 20.0000 mg | ORAL_TABLET | Freq: Every day | ORAL | Status: AC

## 2023-03-19 NOTE — TOC Progression Note (Addendum)
Transition of Care [facility]) - Progression Note    Patient Details  Name: Steve Kelly MRN: [id number] Date of Birth: [birth year]  Transition of Care [facility]) CM/SW Contact  Mearl Latin, LCSW Phone Number: 03/19/2023, 9:04 AM  Clinical Narrative:    9:04 AM-CSW spoke with patient's son Kandis Mannan and he requested [facility] for transport as he has to work until 6pm. He will make patient aware.  CSW met with patient and he reported agreement with plan.      Barriers to Discharge: Continued Medical Work up  Expected Discharge Plan and Services In-house Referral: Clinical Social Work       Expected Discharge Date: 03/19/23                                     Social Determinants of Health (SDOH) Interventions SDOH Screenings   Food Insecurity: Patient Unable To Answer (03/15/2023)  Housing: Patient Unable To Answer (03/15/2023)  Transportation Needs: Patient Unable To Answer (03/15/2023)  Utilities: Patient Unable To Answer (03/15/2023)  Tobacco Use: Low Risk  (03/14/2023)    Readmission Risk Interventions     No data to display

## 2023-03-19 NOTE — Plan of Care (Signed)
  Problem: Education: Goal: Knowledge of General Education information will improve Description: Including pain rating scale, medication(s)/side effects and non-pharmacologic comfort measures Outcome: Progressing   Problem: Health Behavior/Discharge Planning: Goal: Ability to manage health-related needs will improve Outcome: Progressing   Problem: Clinical Measurements: Goal: Ability to maintain clinical measurements within normal limits will improve Outcome: Progressing Goal: Will remain free from infection Outcome: Progressing Goal: Diagnostic test results will improve Outcome: Progressing Goal: Respiratory complications will improve Outcome: Progressing Goal: Cardiovascular complication will be avoided Outcome: Progressing   Problem: Activity: Goal: Risk for activity intolerance will decrease Outcome: Progressing   Problem: Nutrition: Goal: Adequate nutrition will be maintained Outcome: Progressing   Problem: Coping: Goal: Level of anxiety will decrease Outcome: Progressing   Problem: Elimination: Goal: Will not experience complications related to bowel motility Outcome: Progressing Goal: Will not experience complications related to urinary retention Outcome: Progressing   Problem: Pain Management: Goal: General experience of comfort will improve Outcome: Progressing   Problem: Safety: Goal: Ability to remain free from injury will improve Outcome: Progressing   Problem: Skin Integrity: Goal: Risk for impaired skin integrity will decrease Outcome: Progressing   Problem: Education: Goal: Knowledge of disease or condition will improve Outcome: Progressing Goal: Understanding of discharge needs will improve Outcome: Progressing   Problem: Health Behavior/Discharge Planning: Goal: Ability to identify changes in lifestyle to reduce recurrence of condition will improve Outcome: Progressing Goal: Identification of resources available to assist in meeting health  care needs will improve Outcome: Progressing   Problem: Physical Regulation: Goal: Complications related to the disease process, condition or treatment will be avoided or minimized Outcome: Progressing   Problem: Safety: Goal: Ability to remain free from injury will improve Outcome: Progressing

## 2023-03-19 NOTE — Discharge Summary (Addendum)
Steve Kelly NWG:[id number] DOB: 1956-01-09 DOA: 03/14/2023  PCP: Ellis Parents, FNP  Admit date: 03/14/2023  Discharge date: 03/19/2023  Admitted From: Home   Disposition:  SNF   Recommendations for Outpatient Follow-up:   Follow up with PCP in 1-2 weeks  PCP Please obtain BMP/CBC, 2 view CXR in 1week,  (see Discharge instructions)   PCP Please follow up on the following pending results:    Home Health: None   Equipment/Devices: None  Consultations: None  Discharge Condition: Stable    CODE STATUS: Full    Diet Recommendation: Dysphagia 3 diet with feeding assistance and aspiration precautions   Chief Complaint  Patient presents with   Altered Mental Status     Brief history of present illness from the day of admission and additional interim summary    67 y.o. male with medical history significant of alcohol use disorder, HTN and vitamin D deficiency who presents to the ED with altered mental status.  According to family, patient or his at baseline 3 days ago. Today, he appeared weak and confused with difficulty ambulating. He has had multiple falls in the last month. During my evaluation, patient in mittens and still disoriented. Speech is occasionally incomprehensible.  Marland Kitchen He reports drinking a pint of liquor a day and has been drinking since he was 67 years old. He reports his last drink was yesterday, in the ER he was diagnosed with severe DTs and admitted to the hospital.                                                                  Hospital Course   Severe acute metabolic encephalopathy due to DTs from alcohol withdrawal. He has longstanding history of heavy alcohol abuse, has had multiple falls recently at home, came to the ER with altered mental status and rapidly went into severe DTs,  questionable seizure-like activity in the ER, head CT and CT C-spine nonacute, negative EEG. No fever or leukocytosis, has been placed on Librium along with CIWA protocol, DTs almost completely resolved, off of CIWA protocol Librium being tapered off, still quite weak overall and requires assistance with PT OT and speech.  Discharge to SNF.  Needs outpatient neurology follow-up in 2 to 3 weeks.   Thrombocytopenia with fatty liver on ultrasound.  Likely due to #1 above.  Outpatient GI follow-up with local gastroenterology group in 1 to 2 weeks postdischarge.   Hypertension.  Table on oral beta-blocker   Hypokalemia, hypomagnesemia, vitamin D deficiency.  Replaced.   AKI.  Hydrated and resolved   Mild Rhabdomyolysis.  Hydrated, resolved.   Discharge diagnosis     Principal Problem:   Alcohol withdrawal (HCC) Active Problems:   Acute alteration in mental status   Generalized weakness   History of alcohol use  disorder   Thrombocytopenia (HCC)   Hypokalemia    Discharge instructions    Discharge Instructions     Discharge instructions   Complete by: As directed    Do not drive, operate heavy machinery, perform activities at heights, swimming or participation in water activities or provide baby sitting services until you have seen by Primary MD or a Neurologist and advised to do so again.  Follow with Primary MD Ellis Parents, FNP in 7 days   Get CBC, CMP, [place], 2 view Chest X ray -  checked next visit with your primary MD or SNF MD    Activity: As tolerated with Full fall precautions use walker/cane & assistance as needed  Disposition SNF  Diet: Dysphagia 3 diet with feeding assistance and aspiration precautions.   Special Instructions: If you have smoked or chewed Tobacco  in the last 2 yrs please stop smoking, stop any regular Alcohol  and or any Recreational drug use.  On your next visit with your primary care physician please Get Medicines reviewed and  adjusted.  Please request your Prim.MD to go over all Hospital Tests and Procedure/Radiological results at the follow up, please get all Hospital records sent to your Prim MD by signing hospital release before you go home.  If you experience worsening of your admission symptoms, develop shortness of breath, life threatening emergency, suicidal or homicidal thoughts you must seek medical attention immediately by calling 911 or calling your MD immediately  if symptoms less severe.  You Must read complete instructions/literature along with all the possible adverse reactions/side effects for all the Medicines you take and that have been prescribed to you. Take any new Medicines after you have completely understood and accpet all the possible adverse reactions/side effects.   Do not drive when taking Pain medications.  Do not take more than prescribed Pain, Sleep and Anxiety Medications   Increase activity slowly   Complete by: As directed        Discharge Medications   Allergies as of 03/19/2023   No Known Allergies      Medication List     TAKE these medications    aspirin 81 MG chewable tablet Chew 1 tablet (81 mg total) by mouth daily.   atenolol 50 MG tablet Commonly known as: TENORMIN Take 50 mg by mouth daily.   chlordiazePOXIDE 5 MG capsule Commonly known as: LIBRIUM Give 1 pill twice a day for 1 day, then 1 pill once a day for 2 days and stop   famotidine 20 MG tablet Commonly known as: PEPCID Take 1 tablet (20 mg total) by mouth daily.   folic acid 1 MG tablet Commonly known as: FOLVITE Take 1 tablet (1 mg total) by mouth daily.   thiamine 100 MG tablet Commonly known as: Vitamin B-1 Take 1 tablet (100 mg total) by mouth daily.   Vitamin D (Ergocalciferol) 1.25 MG (50000 UNIT) Caps capsule Commonly known as: DRISDOL Take 1 capsule (50,000 Units total) by mouth every 7 (seven) days.         Contact information for follow-up providers     Ellis Parents, FNP. Schedule an appointment as soon as possible for a visit in 1 week(s).   Specialty: Nurse Practitioner Contact information: [address] [place] [address] [phone]         [facility]. Schedule an appointment as soon as possible for a visit in 1 week(s).   Contact information: [address]  [address] [place] [address] [phone]             Contact information for after-discharge care     Destination     [facility] .   Service: Skilled Nursing Contact information: [address] [place] [address] [phone]                     Major procedures and Radiology Reports - PLEASE review detailed and final reports thoroughly  -      EEG adult  Result Date: 03/15/2023 Charlsie Quest, MD     03/15/2023 12:45 PM Patient Name: Steve Kelly MRN: [id number] Epilepsy Attending: Charlsie Quest Referring Physician/Provider: Steffanie Rainwater, MD Date: 03/15/2023 Duration: 23.48 mins Patient history: 67 y.o. male with medical history significant of alcohol use disorder, HTN and vitamin D deficiency admitted for alcohol withdrawal. EEG to evaluate for seizure Level of alertness: Awake AEDs during EEG study: Chlordiazepoxide Technical aspects: This EEG study was done with scalp electrodes positioned according to the 10-20 International system of electrode placement. Electrical activity was reviewed with band pass filter of 1-70Hz , sensitivity of 7 uV/mm, display speed of 106mm/sec with a 60Hz  notched filter applied as appropriate. EEG data were recorded continuously and digitally stored.  Video monitoring was available and reviewed as appropriate. Description: The posterior dominant rhythm consists of 9-10 Hz activity of moderate voltage (25-35 uV) seen predominantly in posterior head regions, symmetric and reactive to eye opening and eye closing. Hyperventilation and photic  stimulation were not performed.   IMPRESSION: This study is within normal limits. No seizures or epileptiform discharges were seen throughout the recording. A normal interictal EEG does not exclude the diagnosis of epilepsy. Priyanka Annabelle Harman   US Abdomen Limited RUQ (LIVER/GB)  Result Date: 03/14/2023 CLINICAL DATA:  Thrombocytopenia, elevated LFTs EXAM: ULTRASOUND ABDOMEN LIMITED RIGHT UPPER QUADRANT COMPARISON:  None Available. FINDINGS: Gallbladder: No gallstones or wall thickening visualized. No sonographic Murphy sign noted by sonographer. Common bile duct: Diameter: 4 mm Liver: Hyperechoic hepatic parenchyma, suggesting hepatic steatosis, with focal fatty sparing along the gallbladder fossa. Portal vein is patent on color Doppler imaging with normal direction of blood flow towards the liver. Other: None. IMPRESSION: Hepatic steatosis with focal fatty sparing. Electronically Signed   By: Charline Bills M.D.   On: 03/14/2023 19:01   CT Head Wo Contrast  Result Date: 03/14/2023 CLINICAL DATA:  Head trauma, pain EXAM: CT HEAD WITHOUT CONTRAST CT CERVICAL SPINE WITHOUT CONTRAST TECHNIQUE: Multidetector CT imaging of the head and cervical spine was performed following the standard protocol without intravenous contrast. Multiplanar CT image reconstructions of the cervical spine were also generated. RADIATION DOSE REDUCTION: This exam was performed according to the departmental dose-optimization program which includes automated exposure control, adjustment of the mA and/or kV according to patient size and/or use of iterative reconstruction technique. COMPARISON:  CT head and cervical spine 01/16/2017 FINDINGS: CT HEAD FINDINGS Brain: There is no acute intracranial hemorrhage, extra-axial fluid collection, or acute infarct. Parenchymal volume is normal. The ventricles are normal in size. Gray-white differentiation is preserved. Hypodensity in the supratentorial white matter likely reflects underlying chronic  small-vessel ischemic change. 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 The pituitary and suprasellar region are normal. There is no mass lesion. There is no mass effect or midline shift. Vascular: No hyperdense vessel or unexpected calcification. Skull: Normal. Negative for fracture or focal lesion. Sinuses/Orbits: The imaged paranasal  sinuses are clear. The globes and orbits are unremarkable. Other: The mastoid air cells and middle ear cavities are clear. CT CERVICAL SPINE FINDINGS Alignment: There is mild degenerative anterolisthesis of C2 on C3. There is no evidence of traumatic malalignment. Skull base and vertebrae: Skull base alignment is maintained. Vertebral body heights are preserved. There is no evidence of acute fracture. There is no suspicious osseous lesion. Soft tissues and spinal canal: No prevertebral fluid or swelling. No visible canal hematoma. Disc levels: There is severe multilevel disc space narrowing and degenerative endplate change with prominent anterior osteophytes throughout the cervical spine. There is osseous fusion across the C5-C6 disc space. There is severe left facet arthropathy at C2-C3. Upper chest: The imaged lung apices are clear. Other: None. IMPRESSION: 1. No acute intracranial pathology. 2. No acute fracture or traumatic malalignment of the cervical spine. 3. Advanced multilevel degenerative changes as above. Electronically Signed   By: Lesia Hausen M.D.   On: 03/14/2023 12:58   CT Cervical Spine Wo Contrast  Result Date: 03/14/2023 CLINICAL DATA:  Head trauma, pain EXAM: CT HEAD WITHOUT CONTRAST CT CERVICAL SPINE WITHOUT CONTRAST TECHNIQUE: Multidetector CT imaging of the head and cervical spine was performed following the standard protocol without intravenous contrast. Multiplanar CT image reconstructions of the cervical spine were also generated. RADIATION DOSE REDUCTION: This exam was performed according to the departmental dose-optimization  program which includes automated exposure control, adjustment of the mA and/or kV according to patient size and/or use of iterative reconstruction technique. COMPARISON:  CT head and cervical spine 01/16/2017 FINDINGS: CT HEAD FINDINGS Brain: There is no acute intracranial hemorrhage, extra-axial fluid collection, or acute infarct. Parenchymal volume is normal. The ventricles are normal in size. Gray-white differentiation is preserved. Hypodensity in the supratentorial white matter likely reflects underlying chronic small-vessel ischemic change. 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 The pituitary and suprasellar region are normal. There is no mass lesion. There is no mass effect or midline shift. Vascular: No hyperdense vessel or unexpected calcification. Skull: Normal. Negative for fracture or focal lesion. Sinuses/Orbits: The imaged paranasal sinuses are clear. The globes and orbits are unremarkable. Other: The mastoid air cells and middle ear cavities are clear. CT CERVICAL SPINE FINDINGS Alignment: There is mild degenerative anterolisthesis of C2 on C3. There is no evidence of traumatic malalignment. Skull base and vertebrae: Skull base alignment is maintained. Vertebral body heights are preserved. There is no evidence of acute fracture. There is no suspicious osseous lesion. Soft tissues and spinal canal: No prevertebral fluid or swelling. No visible canal hematoma. Disc levels: There is severe multilevel disc space narrowing and degenerative endplate change with prominent anterior osteophytes throughout the cervical spine. There is osseous fusion across the C5-C6 disc space. There is severe left facet arthropathy at C2-C3. Upper chest: The imaged lung apices are clear. Other: None. IMPRESSION: 1. No acute intracranial pathology. 2. No acute fracture or traumatic malalignment of the cervical spine. 3. Advanced multilevel degenerative changes as above. Electronically Signed   By:  Lesia Hausen M.D.   On: 03/14/2023 12:58   DG Knee Complete 4 Views Left  Result Date: 03/14/2023 CLINICAL DATA:  Fall, pain EXAM: LEFT KNEE - COMPLETE 4+ VIEW COMPARISON:  None Available. FINDINGS: There is no acute fracture or dislocation. Knee alignment is normal. There is moderate medial compartment joint space narrowing with associated subchondral sclerosis and osteophytosis. There is no erosive change. There is no significant suprapatellar effusion, though positioning is suboptimal  on the lateral projection. IMPRESSION: No acute finding. Moderate medial compartment joint space narrowing. Electronically Signed   By: Lesia Hausen M.D.   On: 03/14/2023 11:56    Micro Results    No results found for this or any previous visit (from the past 240 hour(s)).  Today   Subjective    Steve Kelly today has no headache,no chest abdominal pain,no new weakness tingling or numbness, feels much better     Objective   Blood pressure 134/75, pulse 76, temperature 98.3 F (36.8 C), temperature source Oral, resp. rate 17, height 5\' 9"  (1.753 m), weight 68 kg, SpO2 95%.   Intake/Output Summary (Last 24 hours) at 03/19/2023 0719 Last data filed at 03/19/2023 0500 Gross per 24 hour  Intake 240 ml  Output 2300 ml  Net -2060 ml    Exam  Awake Alert, ) x 2, No new F.N deficits,    [place].AT,PERRAL Supple Neck,   Symmetrical Chest wall movement, Good air movement bilaterally, CTAB RRR,No Gallops,   +ve B.Sounds, Abd Soft, Non tender,  No Cyanosis, Clubbing or edema    Data Review   Recent Labs  Lab 03/15/23 0814 03/16/23 0319 03/17/23 0333 03/18/23 0551 03/19/23 0332  WBC 3.4* 3.7* 4.3 4.2 5.0  HGB 11.0* 10.9* 10.8* 12.8* 11.4*  HCT 31.7* 31.4* 31.5* 37.4* 32.4*  PLT 115* 118* 146* 157 200  MCV 97.2 95.7 96.0 95.7 96.1  MCH 33.7 33.2 32.9 32.7 33.8  MCHC 34.7 34.7 34.3 34.2 35.2  RDW 13.0 12.6 12.3 12.5 12.7  LYMPHSABS 0.9 1.2 1.4 1.2 1.4  MONOABS 0.7 0.8 0.9 0.7 1.1*  EOSABS 0.0  0.0 0.1 0.0 0.0  BASOSABS 0.0 0.0 0.0 0.0 0.0    Recent Labs  Lab 03/14/23 1130 03/14/23 1136 03/14/23 1503 03/15/23 0814 03/16/23 0319 03/17/23 0333 03/18/23 0551 03/19/23 0332  NA  --    < >  --  139 135 136 132* 135  K  --    < >  --  3.1* 3.1* 3.6 4.0 4.1  CL  --    < >  --  102 103 104 103 103  CO2  --    < >  --  20* 22 23 21* 22  ANIONGAP  --    < >  --  17* 10 9 8 10   GLUCOSE  --    < >  --  75 115* 128* 108* 109*  BUN  --    < >  --  13 13 7* 7* 9  CREATININE  --    < >  --  1.01 0.92 0.81 0.87 0.94  AST  --    < >  --  49* 42* 34 37 49*  ALT  --    < >  --  22 21 17 19 23   ALKPHOS  --    < >  --  37* 37* 41 40 38  BILITOT  --    < >  --  2.6* 1.5* 1.2* 1.0 0.9  ALBUMIN  --    < >  --  3.4* 3.2* 3.0* 3.2* 2.9*  INR  --   --  1.1  --   --   --   --   --   AMMONIA 26  --   --   --   --   --   --   --   MG  --    < > 1.5* 1.8 1.7 1.3* 1.6* 2.0  CALCIUM  --    < >  --  9.6 9.1 9.1 9.3 9.0   < > = values in this interval not displayed.    Total Time in preparing paper work, data evaluation and todays exam - 35 minutes  Signature  -    Susa Raring M.D on 03/19/2023 at 7:19 AM   -  To page go to [email or web address]

## 2023-03-19 NOTE — TOC Transition Note (Signed)
Transition of Care [facility]) - CM/SW Discharge Note   Patient Details  Name: Steve Kelly MRN: [id number] Date of Birth: [birth year]  [facility] CM/SW Contact:  Mearl Latin, LCSW Phone Number: 03/19/2023, 12:24 PM   Clinical Narrative:    Patient will DC to: [facility] Anticipated DC date: 03/19/23 Family notified: Son Transport by: Sharin Mons   Per MD patient ready for DC to [facility]. RN to call report prior to discharge [phone] room 325). RN, patient, patient's family, and facility notified of DC. Discharge Summary and FL2 sent to facility. DC packet on chart including signed script. Ambulance transport requested for patient.   CSW will sign off for now as social work intervention is no longer needed. Please consult [place] again if new needs arise.     Final next level of care: Skilled Nursing Facility Barriers to Discharge: Barriers Resolved   Patient Goals and CMS Choice CMS Medicare.gov Compare Post Acute Care list provided to:: Patient Represenative (must comment) Choice offered to / list presented to : Adult Children  Discharge Placement     Existing PASRR number confirmed : 03/19/23          Patient chooses bed at: [facility] Patient to be transferred to facility by: [facility] Name of family member notified: Son Patient and family notified of of transfer: 03/19/23  Discharge Plan and Services Additional resources added to the After Visit Summary for   In-house Referral: Clinical Social Work                                   Social Determinants of Health (SDOH) Interventions SDOH Screenings   Food Insecurity: Patient Unable To Answer (03/15/2023)  Housing: Patient Unable To Answer (03/15/2023)  Transportation Needs: Patient Unable To Answer (03/15/2023)  Utilities: Patient Unable To Answer (03/15/2023)  Tobacco Use: Low Risk  (03/14/2023)     Readmission Risk Interventions     No data to display

## 2023-03-23 ENCOUNTER — Ambulatory Visit: Payer: Medicare Other

## 2023-03-23 VITALS — BP 130/78 | HR 81 | Ht 69.0 in | Wt 146.2 lb

## 2023-03-23 DIAGNOSIS — R011 Cardiac murmur, unspecified: Secondary | ICD-10-CM | POA: Insufficient documentation

## 2023-03-23 DIAGNOSIS — I10 Essential (primary) hypertension: Secondary | ICD-10-CM | POA: Diagnosis not present

## 2023-03-23 NOTE — Assessment & Plan Note (Signed)
Allow prominent murmur.  Appears to have been present for a long.Marland Kitchen  No prior imaging study available for assessment. Will proceed with transthoracic echocardiogram.

## 2023-03-23 NOTE — Progress Notes (Signed)
Cardiology Consultation:    Date:  03/23/2023   ID:  Steve Kelly, DOB 1956-01-11, MRN [id number]  PCP:  Ellis Parents, FNP  Cardiologist:  Marlyn Corporal Karlee Staff, MD   Referring MD: Ellis Parents, FNP   No chief complaint on file.    ASSESSMENT AND PLAN:   Mr. Snyder 67 year old male with history of hypertension, alcohol abuse, former smoker, altered mentation secondary to delirium tremens, currently at skilled nursing facility undergoing rehab here for further evaluation of heart murmur that was identified by PCP and referral placed back in May of this year.  Problem List Items Addressed This Visit     Heart murmur, systolic    Allow prominent murmur.  Appears to have been present for a long.Marland Kitchen  No prior imaging study available for assessment. Will proceed with transthoracic echocardiogram.        Relevant Orders   EKG 12-Lead (Completed)   Hypertension - Primary    Appears well-controlled. Target less than 130/80 mmHg. Continue current medication atenolol 50 mg daily.       Relevant Orders   EKG 12-Lead (Completed)   Return to clinic based on test results. Patient and son mention that they are looking to establish care with cardiologist closer to home in [place] in [place]. Will tentatively schedule for follow-up in 1 year, earlier if needed.  If they do establish care with cardiologist closer to home, will forward the results to them.   History of Present Illness:    [name] is a 67 y.o. male who is being seen today for the evaluation of heart murmur at the request of Ellis Parents, FNP.  Here for the visit accompanied by her son Steve Kelly, who brought him from his Skilled Nursing Facility- [facility].  Initial consult was placed by PCP back in May for further evaluation of heart murmur.  He has history of hypertension, alcohol abuse, former smoker quit at age 82. Was recently admitted and treated inpatient from  03/14/2023 through 03/19/2023 for acute metabolic encephalopathy in the setting of delirium tremens from alcohol withdrawal and discharged to skilled nursing facility at [facility].  He himself is a relatively poor historian at this time.  Until this episode of acute metabolic encephalopathy he was living with his son at home.  Was retired working as a Music therapist.  Was consuming up to a pint of alcohol a day.  After missing a day or so of alcohol he started having altered mentation and was taken to the hospital.  Now he is doing well at rehab.  Here for further evaluation of his heart murmur. He denies any chest pain symptoms.  Denies any shortness of breath.  His strength overall is returning after his recent inpatient stay. Mentions appetite is good.  Denies any nausea vomiting.  Denies any orthopnea.  No pedal edema.  No palpitations.  No syncopal episodes.  EKG in the clinic today shows sinus rhythm heart rate 81/min, normal PR interval 128 ms, QRS duration 82 ms consistent with LVH morphology  Past Medical History:  Diagnosis Date   Alcohol abuse    Heart murmur on physical examination    Hypertension    Tremor    Vitamin D deficiency     History reviewed. No pertinent surgical history.  Current Medications: Current Meds  Medication Sig   aspirin 81 MG chewable tablet Chew 1 tablet (81 mg total) by mouth daily.   atenolol (TENORMIN) 50  MG tablet Take 50 mg by mouth daily.   chlordiazePOXIDE (LIBRIUM) 5 MG capsule Give 1 pill twice a day for 1 day, then 1 pill once a day for 2 days and stop   famotidine (PEPCID) 20 MG tablet Take 1 tablet (20 mg total) by mouth daily.   folic acid (FOLVITE) 1 MG tablet Take 1 tablet (1 mg total) by mouth daily.   thiamine (VITAMIN B-1) 100 MG tablet Take 1 tablet (100 mg total) by mouth daily.   Vitamin D, Ergocalciferol, (DRISDOL) 1.25 MG (50000 UNIT) CAPS capsule Take 1 capsule (50,000 Units total) by mouth every 7 (seven) days.      Allergies:   Patient has no known allergies.   Social History   Socioeconomic History   Marital status: Single    Spouse name: Not on file   Number of children: Not on file   Years of education: Not on file   Highest education level: Not on file  Occupational History   Not on file  Tobacco Use   Smoking status: Never   Smokeless tobacco: Never  Vaping Use   Vaping status: Never Used  Substance and Sexual Activity   Alcohol use: Yes    Comment: 1 pint/day   Drug use: No   Sexual activity: Not Currently  Other Topics Concern   Not on file  Social History Narrative   Not on file   Social Determinants of Health   Financial Resource Strain: Not on file  Food Insecurity: Patient Unable To Answer (03/15/2023)   Hunger Vital Sign    Worried About Running Out of Food in the Last Year: Patient unable to answer    Ran Out of Food in the Last Year: Patient unable to answer  Transportation Needs: Patient Unable To Answer (03/15/2023)   PRAPARE - Administrator, Civil Service (Medical): Patient unable to answer    Lack of Transportation (Non-Medical): Patient unable to answer  Physical Activity: Not on file  Stress: Not on file  Social Connections: Not on file     Family History: The patient's family history includes Diabetes in his mother; Heart attack in his brother and sister; Hyperlipidemia in his mother; Hypertension in his father and mother. ROS:   Please see the history of present illness.    All 14 point review of systems negative except as described per history of present illness.  EKGs/Labs/Other Studies Reviewed:    The following studies were reviewed today:   EKG:  EKG Interpretation Date/Time:  Tuesday March 23 2023 15:23:19 EST Ventricular Rate:  81 PR Interval:  128 QRS Duration:  82 QT Interval:  398 QTC Calculation: 462 R Axis:   31  Text Interpretation: Normal sinus rhythm Possible Left atrial enlargement Left ventricular hypertrophy  Cannot rule out Septal infarct , age undetermined Abnormal ECG When compared with ECG of 14-Mar-2023 14:59, PREVIOUS ECG IS PRESENT Confirmed by Huntley Dec reddy [phone]) on 03/23/2023 3:26:01 PM    Recent Labs: 03/19/2023: ALT 23; BUN 9; Creatinine, Ser 0.94; Hemoglobin 11.4; Magnesium 2.0; Platelets 200; Potassium 4.1; Sodium 135  Recent Lipid Panel No results found for: "CHOL", "TRIG", "HDL", "CHOLHDL", "VLDL", "LDLCALC", "LDLDIRECT"  Physical Exam:    VS:  BP 130/78   Pulse 81   Ht 5\' 9"  (1.753 m)   Wt 146 lb 3.2 oz (66.3 kg)   SpO2 97%   BMI 21.59 kg/m     Wt Readings from Last 3 Encounters:  03/23/23 146 lb  3.2 oz (66.3 kg)  03/14/23 150 lb (68 kg)     GENERAL:  Well nourished, well developed in no acute distress NECK: No JVD; No carotid bruits CARDIAC: RRR, S1 and S2 present, loud 5/6 harsh ejection systolic murmur heard all over the precordium, best heard in right upper third intercostal space. CHEST:  Clear to auscultation without rales, wheezing or rhonchi  Extremities: No pitting pedal edema. Pulses bilaterally symmetric with radial 2+ and dorsalis pedis 2+ NEUROLOGIC:  Alert and oriented x 3  Medication Adjustments/Labs and Tests Ordered: Current medicines are reviewed at length with the patient today.  Concerns regarding medicines are outlined above.  Orders Placed This Encounter  Procedures   EKG 12-Lead   No orders of the defined types were placed in this encounter.   Signed, Cecille Amsterdam, MD, MPH, [facility]. 03/23/2023 3:51 PM    [facility]

## 2023-03-23 NOTE — Assessment & Plan Note (Signed)
Appears well-controlled. Target less than 130/80 mmHg. Continue current medication atenolol 50 mg daily.

## 2023-03-23 NOTE — Patient Instructions (Signed)
Medication Instructions:  Your physician recommends that you continue on your current medications as directed. Please refer to the Current Medication list given to you today.  *If you need a refill on your cardiac medications before your next appointment, please call your pharmacy*   Lab Work: None ordered   If you have labs (blood work) drawn today and your tests are completely normal, you will receive your results only by: MyChart Message (if you have MyChart) OR A paper copy in the mail If you have any lab test that is abnormal or we need to change your treatment, we will call you to review the results.   Testing/Procedures: Your physician has requested that you have an echocardiogram. Echocardiography is a painless test that uses sound waves to create images of your heart. It provides your doctor with information about the size and shape of your heart and how well your heart's chambers and valves are working. This procedure takes approximately one hour. There are no restrictions for this procedure. Please do NOT wear cologne, perfume, aftershave, or lotions (deodorant is allowed). Please arrive 15 minutes prior to your appointment time.  Please note: We ask at that you not bring children with you during ultrasound (echo/ vascular) testing. Due to room size and safety concerns, children are not allowed in the ultrasound rooms during exams. Our front office staff cannot provide observation of children in our lobby area while testing is being conducted. An adult accompanying a patient to their appointment will only be allowed in the ultrasound room at the discretion of the ultrasound technician under special circumstances. We apologize for any inconvenience.    Follow-Up: Follow up based on test results   Other Instructions

## 2023-04-14 ENCOUNTER — Ambulatory Visit: Payer: Medicaid Other | Admitting: Neurology

## 2023-04-14 ENCOUNTER — Encounter: Payer: Self-pay | Admitting: Neurology

## 2023-04-23 ENCOUNTER — Other Ambulatory Visit: Payer: Self-pay

## 2023-04-23 ENCOUNTER — Emergency Department: Payer: Medicare Other

## 2023-04-23 ENCOUNTER — Encounter: Payer: Self-pay | Admitting: Internal Medicine

## 2023-04-23 ENCOUNTER — Inpatient Hospital Stay
Admission: EM | Admit: 2023-04-23 | Discharge: 2023-05-07 | DRG: 896 | Disposition: A | Discharge status: Skilled Nursing Facility | Payer: Medicare Other | Attending: Student in an Organized Health Care Education/Training Program | Admitting: Student in an Organized Health Care Education/Training Program

## 2023-04-23 DIAGNOSIS — G9341 Metabolic encephalopathy: Secondary | ICD-10-CM | POA: Diagnosis not present

## 2023-04-23 DIAGNOSIS — I10 Essential (primary) hypertension: Secondary | ICD-10-CM | POA: Diagnosis present

## 2023-04-23 DIAGNOSIS — D649 Anemia, unspecified: Secondary | ICD-10-CM | POA: Diagnosis present

## 2023-04-23 DIAGNOSIS — R4182 Altered mental status, unspecified: Secondary | ICD-10-CM | POA: Diagnosis not present

## 2023-04-23 DIAGNOSIS — I350 Nonrheumatic aortic (valve) stenosis: Secondary | ICD-10-CM

## 2023-04-23 DIAGNOSIS — R4781 Slurred speech: Secondary | ICD-10-CM

## 2023-04-23 DIAGNOSIS — F10931 Alcohol use, unspecified with withdrawal delirium: Principal | ICD-10-CM | POA: Diagnosis present

## 2023-04-23 DIAGNOSIS — E559 Vitamin D deficiency, unspecified: Secondary | ICD-10-CM | POA: Diagnosis present

## 2023-04-23 DIAGNOSIS — E162 Hypoglycemia, unspecified: Secondary | ICD-10-CM | POA: Diagnosis not present

## 2023-04-23 DIAGNOSIS — Z751 Person awaiting admission to adequate facility elsewhere: Secondary | ICD-10-CM | POA: Diagnosis not present

## 2023-04-23 DIAGNOSIS — E876 Hypokalemia: Secondary | ICD-10-CM | POA: Diagnosis not present

## 2023-04-23 DIAGNOSIS — R569 Unspecified convulsions: Secondary | ICD-10-CM | POA: Diagnosis present

## 2023-04-23 DIAGNOSIS — Z833 Family history of diabetes mellitus: Secondary | ICD-10-CM | POA: Diagnosis not present

## 2023-04-23 DIAGNOSIS — I060 Rheumatic aortic stenosis: Secondary | ICD-10-CM | POA: Diagnosis present

## 2023-04-23 DIAGNOSIS — R7989 Other specified abnormal findings of blood chemistry: Secondary | ICD-10-CM | POA: Diagnosis not present

## 2023-04-23 DIAGNOSIS — K219 Gastro-esophageal reflux disease without esophagitis: Secondary | ICD-10-CM | POA: Insufficient documentation

## 2023-04-23 DIAGNOSIS — F10231 Alcohol dependence with withdrawal delirium: Principal | ICD-10-CM | POA: Diagnosis present

## 2023-04-23 DIAGNOSIS — D72829 Elevated white blood cell count, unspecified: Secondary | ICD-10-CM | POA: Diagnosis present

## 2023-04-23 DIAGNOSIS — I2489 Other forms of acute ischemic heart disease: Secondary | ICD-10-CM | POA: Diagnosis present

## 2023-04-23 DIAGNOSIS — Z9181 History of falling: Secondary | ICD-10-CM | POA: Diagnosis not present

## 2023-04-23 DIAGNOSIS — J189 Pneumonia, unspecified organism: Secondary | ICD-10-CM

## 2023-04-23 DIAGNOSIS — K760 Fatty (change of) liver, not elsewhere classified: Secondary | ICD-10-CM | POA: Diagnosis present

## 2023-04-23 DIAGNOSIS — I5A Non-ischemic myocardial injury (non-traumatic): Secondary | ICD-10-CM | POA: Diagnosis present

## 2023-04-23 DIAGNOSIS — R9431 Abnormal electrocardiogram [ECG] [EKG]: Secondary | ICD-10-CM | POA: Diagnosis not present

## 2023-04-23 DIAGNOSIS — E878 Other disorders of electrolyte and fluid balance, not elsewhere classified: Secondary | ICD-10-CM | POA: Diagnosis present

## 2023-04-23 DIAGNOSIS — F0390 Unspecified dementia without behavioral disturbance: Secondary | ICD-10-CM | POA: Diagnosis present

## 2023-04-23 DIAGNOSIS — F1090 Alcohol use, unspecified, uncomplicated: Secondary | ICD-10-CM | POA: Diagnosis not present

## 2023-04-23 DIAGNOSIS — Z87891 Personal history of nicotine dependence: Secondary | ICD-10-CM | POA: Diagnosis not present

## 2023-04-23 DIAGNOSIS — Z8249 Family history of ischemic heart disease and other diseases of the circulatory system: Secondary | ICD-10-CM

## 2023-04-23 DIAGNOSIS — R471 Dysarthria and anarthria: Secondary | ICD-10-CM | POA: Diagnosis present

## 2023-04-23 DIAGNOSIS — Z8673 Personal history of transient ischemic attack (TIA), and cerebral infarction without residual deficits: Secondary | ICD-10-CM | POA: Diagnosis not present

## 2023-04-23 DIAGNOSIS — Z83438 Family history of other disorder of lipoprotein metabolism and other lipidemia: Secondary | ICD-10-CM

## 2023-04-23 DIAGNOSIS — Z79899 Other long term (current) drug therapy: Secondary | ICD-10-CM | POA: Diagnosis not present

## 2023-04-23 DIAGNOSIS — Z7982 Long term (current) use of aspirin: Secondary | ICD-10-CM

## 2023-04-23 DIAGNOSIS — F1010 Alcohol abuse, uncomplicated: Secondary | ICD-10-CM | POA: Diagnosis present

## 2023-04-23 DIAGNOSIS — F10939 Alcohol use, unspecified with withdrawal, unspecified: Secondary | ICD-10-CM | POA: Diagnosis present

## 2023-04-23 HISTORY — DX: Alcohol use, unspecified with withdrawal delirium: F10.931

## 2023-04-23 HISTORY — DX: Non-ischemic myocardial injury (non-traumatic): I5A

## 2023-04-23 HISTORY — DX: Gastro-esophageal reflux disease without esophagitis: K21.9

## 2023-04-23 HISTORY — DX: Metabolic encephalopathy: G93.41

## 2023-04-23 HISTORY — DX: Elevated white blood cell count, unspecified: D72.829

## 2023-04-23 LAB — CBC
HCT: 43.9 % (ref 39.0–52.0)
Hemoglobin: 14.8 g/dL (ref 13.0–17.0)
MCH: 32.9 pg (ref 26.0–34.0)
MCHC: 33.7 g/dL (ref 30.0–36.0)
MCV: 97.6 fL (ref 80.0–100.0)
Platelets: 211 10*3/uL (ref 150–400)
RBC: 4.5 MIL/uL (ref 4.22–5.81)
RDW: 13.3 % (ref 11.5–15.5)
WBC: 10.6 10*3/uL — ABNORMAL HIGH (ref 4.0–10.5)
nRBC: 0 % (ref 0.0–0.2)

## 2023-04-23 LAB — COMPREHENSIVE METABOLIC PANEL
ALT: 11 U/L (ref 0–44)
AST: 29 U/L (ref 15–41)
Albumin: 4.2 g/dL (ref 3.5–5.0)
Alkaline Phosphatase: 85 U/L (ref 38–126)
Anion gap: 12 (ref 5–15)
BUN: 9 mg/dL (ref 8–23)
CO2: 27 mmol/L (ref 22–32)
Calcium: 9.2 mg/dL (ref 8.9–10.3)
Chloride: 96 mmol/L — ABNORMAL LOW (ref 98–111)
Creatinine, Ser: 1.2 mg/dL (ref 0.61–1.24)
GFR, Estimated: >60 mL/min (ref >60)
Glucose, Bld: 186 mg/dL — ABNORMAL HIGH (ref 70–99)
Potassium: 3.5 mmol/L (ref 3.5–5.1)
Sodium: 135 mmol/L (ref 135–145)
Total Bilirubin: 1 mg/dL (ref <1.2)
Total Protein: 8.6 g/dL — ABNORMAL HIGH (ref 6.5–8.1)

## 2023-04-23 LAB — PROTIME-INR
INR: 1.1 (ref 0.8–1.2)
Prothrombin Time: 14 s (ref 11.4–15.2)

## 2023-04-23 LAB — DIFFERENTIAL
Abs Immature Granulocytes: 0.03 10*3/uL (ref 0.00–0.07)
Basophils Absolute: 0 10*3/uL (ref 0.0–0.1)
Basophils Relative: 0 %
Eosinophils Absolute: 0 10*3/uL (ref 0.0–0.5)
Eosinophils Relative: 0 %
Immature Granulocytes: 0 %
Lymphocytes Relative: 36 %
Lymphs Abs: 3.8 10*3/uL (ref 0.7–4.0)
Monocytes Absolute: 0.8 10*3/uL (ref 0.1–1.0)
Monocytes Relative: 7 %
Neutro Abs: 6 10*3/uL (ref 1.7–7.7)
Neutrophils Relative %: 57 %

## 2023-04-23 LAB — URINALYSIS, ROUTINE W REFLEX MICROSCOPIC
Bacteria, UA: NONE SEEN
Bilirubin Urine: NEGATIVE
Glucose, UA: 150 mg/dL — AB
Ketones, ur: NEGATIVE mg/dL
Leukocytes,Ua: NEGATIVE
Nitrite: NEGATIVE
Protein, ur: 30 mg/dL — AB
Specific Gravity, Urine: 1.012 (ref 1.005–1.030)
pH: 6 (ref 5.0–8.0)

## 2023-04-23 LAB — CBG MONITORING, ED: Glucose-Capillary: 177 mg/dL — ABNORMAL HIGH (ref 70–99)

## 2023-04-23 LAB — LIPID PANEL
Cholesterol: 187 mg/dL (ref 0–200)
HDL: 52 mg/dL (ref >40)
LDL Cholesterol: 119 mg/dL — ABNORMAL HIGH (ref 0–99)
Total CHOL/HDL Ratio: 3.6 {ratio}
Triglycerides: 79 mg/dL (ref <150)
VLDL: 16 mg/dL (ref 0–40)

## 2023-04-23 LAB — URINE DRUG SCREEN, QUALITATIVE (ARMC ONLY)
Amphetamines, Ur Screen: NOT DETECTED
Barbiturates, Ur Screen: NOT DETECTED
Benzodiazepine, Ur Scrn: POSITIVE — AB
Cannabinoid 50 Ng, Ur ~~LOC~~: POSITIVE — AB
Cocaine Metabolite,Ur ~~LOC~~: NOT DETECTED
MDMA (Ecstasy)Ur Screen: NOT DETECTED
Methadone Scn, Ur: NOT DETECTED
Opiate, Ur Screen: NOT DETECTED
Phencyclidine (PCP) Ur S: NOT DETECTED
Tricyclic, Ur Screen: NOT DETECTED

## 2023-04-23 LAB — ETHANOL: Alcohol, Ethyl (B): <10 mg/dL (ref <10)

## 2023-04-23 LAB — TROPONIN I (HIGH SENSITIVITY)
Troponin I (High Sensitivity): 44 ng/L — ABNORMAL HIGH (ref <18)
Troponin I (High Sensitivity): 73 ng/L — ABNORMAL HIGH (ref <18)

## 2023-04-23 LAB — AMMONIA: Ammonia: 30 umol/L (ref 9–35)

## 2023-04-23 LAB — APTT: aPTT: 28 s (ref 24–36)

## 2023-04-23 LAB — TSH: TSH: 0.975 u[IU]/mL (ref 0.350–4.500)

## 2023-04-23 MED ORDER — SODIUM CHLORIDE 0.9 % IV SOLN: INTRAVENOUS | Status: DC

## 2023-04-23 MED ORDER — SODIUM CHLORIDE 0.9 % IV BOLUS
1000.0000 mL | Freq: Once | INTRAVENOUS | Status: AC
  Administered 2023-04-23: 1000 mL via INTRAVENOUS

## 2023-04-23 MED ORDER — PHENOBARBITAL SODIUM 65 MG/ML IJ SOLN: 32.5000 mg | Freq: Three times a day (TID) | INTRAMUSCULAR | Status: DC

## 2023-04-23 MED ORDER — METOPROLOL TARTRATE 5 MG/5ML IV SOLN
INTRAVENOUS | Status: AC
  Filled 2023-04-23: qty 5

## 2023-04-23 MED ORDER — PHENOBARBITAL SODIUM 130 MG/ML IJ SOLN: 97.5000 mg | Freq: Three times a day (TID) | INTRAMUSCULAR | Status: DC

## 2023-04-23 MED ORDER — SODIUM CHLORIDE 0.9% FLUSH
3.0000 mL | Freq: Once | INTRAVENOUS | Status: AC
  Administered 2023-04-23: 3 mL via INTRAVENOUS

## 2023-04-23 MED ORDER — LORAZEPAM 2 MG/ML IJ SOLN
2.0000 mg | INTRAMUSCULAR | Status: DC | PRN
  Administered 2023-04-24: 2 mg via INTRAVENOUS
  Filled 2023-04-23: qty 1

## 2023-04-23 MED ORDER — PHENOBARBITAL SODIUM 130 MG/ML IJ SOLN
97.5000 mg | Freq: Three times a day (TID) | INTRAMUSCULAR | Status: AC
Start: 2023-04-24 — End: 2023-04-26
  Administered 2023-04-24 – 2023-04-25 (×6): 97.5 mg via INTRAVENOUS
  Filled 2023-04-23 (×6): qty 1

## 2023-04-23 MED ORDER — THIAMINE HCL 100 MG/ML IJ SOLN
100.0000 mg | Freq: Every day | INTRAMUSCULAR | Status: DC
  Administered 2023-04-24 – 2023-04-28 (×3): 100 mg via INTRAVENOUS
  Filled 2023-04-23 (×7): qty 2

## 2023-04-23 MED ORDER — LORAZEPAM 1 MG PO TABS: 1.0000 mg | ORAL_TABLET | ORAL | Status: AC | PRN

## 2023-04-23 MED ORDER — HYDRALAZINE HCL 20 MG/ML IJ SOLN: 5.0000 mg | INTRAMUSCULAR | Status: DC | PRN

## 2023-04-23 MED ORDER — THIAMINE HCL 100 MG/ML IJ SOLN
500.0000 mg | Freq: Once | INTRAVENOUS | Status: AC
  Administered 2023-04-23: 500 mg via INTRAVENOUS
  Filled 2023-04-23: qty 5

## 2023-04-23 MED ORDER — ONDANSETRON HCL 4 MG/2ML IJ SOLN
4.0000 mg | Freq: Three times a day (TID) | INTRAMUSCULAR | Status: DC | PRN
  Administered 2023-05-02: 4 mg via INTRAVENOUS
  Filled 2023-04-23: qty 2

## 2023-04-23 MED ORDER — METOPROLOL TARTRATE 5 MG/5ML IV SOLN
5.0000 mg | Freq: Once | INTRAVENOUS | Status: AC
  Administered 2023-04-23: 5 mg via INTRAVENOUS

## 2023-04-23 MED ORDER — FOLIC ACID 1 MG PO TABS
1.0000 mg | ORAL_TABLET | Freq: Every day | ORAL | Status: DC
  Administered 2023-04-23 – 2023-05-07 (×13): 1 mg via ORAL
  Filled 2023-04-23 (×14): qty 1

## 2023-04-23 MED ORDER — CHLORDIAZEPOXIDE HCL 5 MG PO CAPS
25.0000 mg | ORAL_CAPSULE | Freq: Three times a day (TID) | ORAL | Status: DC
  Administered 2023-04-26 – 2023-04-27 (×4): 25 mg via ORAL
  Filled 2023-04-23 (×4): qty 5
  Filled 2023-04-23: qty 1

## 2023-04-23 MED ORDER — THIAMINE MONONITRATE 100 MG PO TABS
100.0000 mg | ORAL_TABLET | Freq: Every day | ORAL | Status: DC
  Administered 2023-04-23 – 2023-05-07 (×12): 100 mg via ORAL
  Filled 2023-04-23 (×12): qty 1

## 2023-04-23 MED ORDER — PHENOBARBITAL SODIUM 65 MG/ML IJ SOLN: 65.0000 mg | Freq: Three times a day (TID) | INTRAMUSCULAR | Status: DC

## 2023-04-23 MED ORDER — ENOXAPARIN SODIUM 40 MG/0.4ML IJ SOSY
40.0000 mg | PREFILLED_SYRINGE | INTRAMUSCULAR | Status: DC
  Administered 2023-04-23: 40 mg via SUBCUTANEOUS
  Filled 2023-04-23: qty 0.4

## 2023-04-23 MED ORDER — PHENOBARBITAL SODIUM 65 MG/ML IJ SOLN
65.0000 mg | Freq: Three times a day (TID) | INTRAMUSCULAR | Status: DC
  Administered 2023-04-26: 65 mg via INTRAVENOUS
  Filled 2023-04-23: qty 1

## 2023-04-23 MED ORDER — ADULT MULTIVITAMIN W/MINERALS CH
1.0000 | ORAL_TABLET | Freq: Every day | ORAL | Status: DC
  Administered 2023-04-23 – 2023-05-07 (×13): 1 via ORAL
  Filled 2023-04-23 (×14): qty 1

## 2023-04-23 MED ORDER — LORAZEPAM 2 MG/ML IJ SOLN
1.0000 mg | INTRAMUSCULAR | Status: AC | PRN
  Administered 2023-04-23: 3 mg via INTRAVENOUS
  Administered 2023-04-23: 4 mg via INTRAVENOUS
  Administered 2023-04-24: 1 mg via INTRAVENOUS
  Administered 2023-04-24 – 2023-04-25 (×4): 2 mg via INTRAVENOUS
  Filled 2023-04-23: qty 2
  Filled 2023-04-23 (×5): qty 1
  Filled 2023-04-23: qty 2

## 2023-04-23 MED ORDER — PHENOBARBITAL SODIUM 130 MG/ML IJ SOLN
130.0000 mg | Freq: Once | INTRAMUSCULAR | Status: AC
  Administered 2023-04-23: 130 mg via INTRAVENOUS
  Filled 2023-04-23: qty 1

## 2023-04-23 MED ORDER — ACETAMINOPHEN 650 MG RE SUPP
650.0000 mg | Freq: Four times a day (QID) | RECTAL | Status: DC | PRN
  Administered 2023-04-24 – 2023-04-25 (×2): 650 mg via RECTAL
  Filled 2023-04-23: qty 1
  Filled 2023-04-23: qty 2

## 2023-04-23 NOTE — ED Notes (Signed)
VOL moved to [facility] 5  pending consult

## 2023-04-23 NOTE — ED Notes (Signed)
 CCMD called to monitor patient.

## 2023-04-23 NOTE — Consult Note (Signed)
CODE STROKE- PHARMACY COMMUNICATION   Time CODE STROKE called/page received: [id number]  Time response to CODE STROKE was made (in person or via phone): Immediately, in person  Time Stroke Kit retrieved from [facility] (only if needed): No thrombolytic per neurologist  Name of Provider/Nurse contacted: Dr. Wilford Corner  Past Medical History:  Diagnosis Date   Alcohol abuse    Heart murmur on physical examination    Hypertension    Tremor    Vitamin D deficiency    Prior to Admission medications   Medication Sig Start Date End Date Taking? Authorizing Provider  aspirin 81 MG chewable tablet Chew 1 tablet (81 mg total) by mouth daily. 03/19/23   Leroy Sea, MD  atenolol (TENORMIN) 50 MG tablet Take 50 mg by mouth daily. 09/16/22   [provider]  chlordiazePOXIDE (LIBRIUM) 5 MG capsule Give 1 pill twice a day for 1 day, then 1 pill once a day for 2 days and stop 03/19/23   Leroy Sea, MD  famotidine (PEPCID) 20 MG tablet Take 1 tablet (20 mg total) by mouth daily. 03/19/23   Leroy Sea, MD  folic acid (FOLVITE) 1 MG tablet Take 1 tablet (1 mg total) by mouth daily. 03/19/23   Leroy Sea, MD  thiamine (VITAMIN B-1) 100 MG tablet Take 1 tablet (100 mg total) by mouth daily. 03/19/23   Leroy Sea, MD  Vitamin D, Ergocalciferol, (DRISDOL) 1.25 MG (50000 UNIT) CAPS capsule Take 1 capsule (50,000 Units total) by mouth every 7 (seven) days. 03/19/23   Leroy Sea, MD    Orson Aloe ,PharmD Clinical Pharmacist  04/23/2023  4:07 PM

## 2023-04-23 NOTE — ED Notes (Signed)
Steve Crete, MD, made aware of sinus tach in 160s

## 2023-04-23 NOTE — Consult Note (Addendum)
NEUROLOGY CONSULT NOTE   Date of service: April 23, 2023 Patient Name: Steve Kelly MRN:  [id number] DOB:  [birth year] Chief Complaint: "Unresponsiveness-code stroke" Requesting Provider: Janith Lima, MD  History of Present Illness  Steve Kelly is a 67 y.o. male  has a past medical history of Alcohol abuse, Heart murmur on physical examination, Hypertension, Tremor, and Vitamin D deficiency.   Presented to the emergency department for concern for seizure followed by unresponsiveness and aphasia for which code stroke was activated He was last known well witnessed by family at 2 PM and later on in the day was noted to be having a staring spell and becoming unresponsive.  After that he was unable to follow commands and was nonverbal for which code stroke was activated and he was brought into [facility] for further evaluation On the way, his mentation improved and when he was evaluated at the bridge, he was able to follow all commands and did not exhibit concerning focal neurological deficits.  He has some tremor that is evident all over his body but that is listed on his prior medical conditions. He denies headaches shortness of breath.  He denies any alcohol use although he has prior been admitted for alcohol use related issues. No family was present at bedside  Last discharge summary for 03/19/2023 was for severe acute metabolic encephalopathy due to DTs from alcohol withdrawal although he denies alcohol use.  Unclear when his last drink was.   LKW: 1400 hrs. Modified rankin score: 1-No significant post stroke disability and can perform usual duties with stroke symptoms IV Thrombolysis: Low suspicion  for stroke, stat MRI done-MRI negative for stroke EVT: Same as above  NIHSS components Score: Comment  1a Level of Conscious 0[x]  1[]  2[]  3[]      1b LOC Questions 0[x]  1[]  2[]       1c LOC Commands 0[x]  1[]  2[]       2 Best Gaze 0[x]  1[]  2[]       3 Visual 0[x]  1[]   2[]  3[]      4 Facial Palsy 0[x]  1[]  2[]  3[]      5a Motor Arm - left 0[x]  1[]  2[]  3[]  4[]  UN[]    5b Motor Arm - Right 0[x]  1[]  2[]  3[]  4[]  UN[]    6a Motor Leg - Left 0[x]  1[]  2[]  3[]  4[]  UN[]    6b Motor Leg - Right 0[x]  1[]  2[]  3[]  4[]  UN[]    7 Limb Ataxia 0[x]  1[]  2[]  3[]  UN[]     8 Sensory 0[x]  1[]  2[]  UN[]      9 Best Language 0[x]  1[]  2[]  3[]      10 Dysarthria 0[]  1[x]  2[]  UN[]      11 Extinct. and Inattention 0[x]  1[]  2[]       TOTAL:       ROS  Comprehensive ROS performed and pertinent positives documented in HPI    Past History   Past Medical History:  Diagnosis Date   Alcohol abuse    Heart murmur on physical examination    Hypertension    Tremor    Vitamin D deficiency     No past surgical history on file.  Family History: Family History  Problem Relation Age of Onset   Diabetes Mother    Hypertension Mother    Hyperlipidemia Mother    Hypertension Father    Heart attack Sister    Heart attack Brother     Social History  reports that he has never smoked. He has never used smokeless tobacco. He  reports current alcohol use. He reports that he does not use drugs.  No Known Allergies  Medications   Current Facility-Administered Medications:    sodium chloride flush (NS) 0.9 % injection 3 mL, 3 mL, Intravenous, Once, Janith Lima, MD  Current Outpatient Medications:    aspirin 81 MG chewable tablet, Chew 1 tablet (81 mg total) by mouth daily., Disp: , Rfl:    atenolol (TENORMIN) 50 MG tablet, Take 50 mg by mouth daily., Disp: , Rfl:    chlordiazePOXIDE (LIBRIUM) 5 MG capsule, Give 1 pill twice a day for 1 day, then 1 pill once a day for 2 days and stop, Disp: 4 capsule, Rfl: 0   famotidine (PEPCID) 20 MG tablet, Take 1 tablet (20 mg total) by mouth daily., Disp: , Rfl:    folic acid (FOLVITE) 1 MG tablet, Take 1 tablet (1 mg total) by mouth daily., Disp: , Rfl:    thiamine (VITAMIN B-1) 100 MG tablet, Take 1 tablet (100 mg total) by mouth daily., Disp: ,  Rfl:    Vitamin D, Ergocalciferol, (DRISDOL) 1.25 MG (50000 UNIT) CAPS capsule, Take 1 capsule (50,000 Units total) by mouth every 7 (seven) days., Disp: , Rfl:   Vitals  There were no vitals filed for this visit.  There is no height or weight on file to calculate BMI.  Physical Exam   General: Awake alert in no distress HEENT: Normocephalic dermatic Lungs: Clear Cardiovascular: Regular rhythm Abdomen nondistended nontender Neurologic exam He is awake alert oriented x 3 His speech is mildly dysarthric-has almost vocal tremor. No evidence of aphasia Somewhat diminished attention concentration Current nerves: Pupils equal round react light, extract movements intact, visual field full, face symmetric, facial sensation intact, hearing reduced bilaterally, tongue and palate midline. Motor exam no drift in any of the 4 extremities but lower extremities are generally weaker than upper extremities. Sensation intact to light touch without extinction Coordination examination with action and rest tremor without gross dysmetria in upper or lower extremities bilaterally.   Labs/Imaging/Neurodiagnostic studies   CBC:  Recent Labs  Lab 05-15-2023 1601  WBC 10.6*  NEUTROABS 6.0  HGB 14.8  HCT 43.9  MCV 97.6  PLT 211   Basic Metabolic Panel:  Lab Results  Component Value Date   NA 135 03/19/2023   K 4.1 03/19/2023   CO2 22 03/19/2023   GLUCOSE 109 (H) 03/19/2023   BUN 9 03/19/2023   CREATININE 0.94 03/19/2023   CALCIUM 9.0 03/19/2023   GFRNONAA >60 03/19/2023   GFRAA 52 (L) 01/16/2017   Alcohol Level     Component Value Date/Time   ETH <10 03/14/2023 1136   INR  Lab Results  Component Value Date   INR 1.1 03/14/2023   CT Head without contrast(Personally reviewed): No acute changes  CT angio Head and Neck with contrast(Personally reviewed): Not performed due to low suspicion for stroke    MRI Brain(Personally reviewed): No acute stroke on diffusion imaging-formal  read pending and further imaging pending    ASSESSMENT   Steve Kelly is a 67 y.o. male  has a past medical history of Alcohol abuse, Heart murmur on physical examination, Hypertension, Tremor, and Vitamin D deficiency.  Brought in for evaluation of staring like spell followed by unresponsiveness and being nonverbal and noncommunicative. Code stroke was activated due to concern for aphasia On examination, he was noted to have mild dysarthria and generalized tremor but otherwise no focal deficits He denies alcohol use although his discharge in 03/2023  was for admission related to alcohol use disorder alcohol withdrawal and severe DTs.  I suspect possible underlying alcohol use related encephalopathy versus alcohol withdrawal seizures.  Due to chronic alcohol use, there may be some underlying cognitive deficits such as early dementia.    RECOMMENDATIONS  Stat imaging of the brain is negative for stroke-CT head and MRI brain. At this point, I would suggest observing him for DTs. I would recommend checking a U-Tox and serum ethanol level I will also recommend checking an ammonia level CIWA protocol I do not have the facilities for getting EEGs over the weekend but if he exhibits any seizure-like activity or concern for it, will transfer him to [facility] for spot and long-term EEG as needed. Check B12, TSH, RPR for reversible causes of dementia. Check urinalysis and chest x-ray for any underlying infection causing acute encephalopathy Maintain seizure precautions although not clear if what he had at home was a seizure but given his prior history of alcohol use and withdrawal, vigilance is recommended. Plan was discussed with Dr. Anner Crete ______________________________________________________________________    Signed, Milon Dikes, MD [facility]

## 2023-04-23 NOTE — ED Notes (Signed)
Patient placed on 2L nasal cannula due to O2 in 80s

## 2023-04-23 NOTE — Progress Notes (Signed)
   04/23/23 1600  Spiritual Encounters  Type of Visit Initial;Attempt (pt unavailable)  Care provided to: Patient  Conversation partners present during encounter Other (comment)  Referral source Code page  Reason for visit Code  OnCall Visit Yes   Chaplain received code STROKE for the patient. Receptionist told chaplain that there was no family present for the patient. Chaplain told her to page if the family arrives or if the patient would like to see a chaplain. Chaplain services remain available for spiritual and emotional support.

## 2023-04-23 NOTE — Code Documentation (Signed)
Stroke Response Nurse Documentation Code Documentation  Steve Kelly is a 67 y.o. male arriving to [facility] via [facility] on 04/23/2023 with past medical hx of alcohol abuse, heart murmur, vit D deficiency, tremor, HTN. On aspirin 81 mg daily. Code stroke was activated by EMS.   Patient from home where he was LKW at 1400 and now complaining of aphasia and possible seizure. Pt was seen by family at baseline at 1400. He was noted to have a staring spell, became unresponsive, and was then aphasic.   Stroke team at the bedside on patient arrival. Labs drawn and patient cleared for CT by Dr. Anner Crete. Patient to CT with team. NIHSS 2, see documentation for details and code stroke times. Patient with Expressive aphasia  and dysarthria  on exam. The following imaging was completed:  CT Head and MRI. Patient is not a candidate for IV Thrombolytic due to stroke not suspected, no stroke on MRI, per MD. Patient is not a candidate for IR due to no stroke on MRI, per MD.   Care Plan: code stroke cancelled at 1633, per MD.  Bedside handoff with ED RN Misty Stanley.    Wille Glaser  Stroke Response RN

## 2023-04-23 NOTE — ED Provider Notes (Signed)
[facility] Provider Note    Event Date/Time   First MD Initiated Contact with Patient 04/23/23 1556     (approximate)   History   Code Stroke   HPI Steve Kelly is a 67 y.o. male with history of alcohol abuse, baseline tremor presenting today as a stroke alert in the field.  Patient reportedly with last known normal at 2 PM.  Reportedly had seizure-like activity followed by unresponsiveness and aphasia for which code stroke was called.  Family noticed a staring spell initially.  On arrival to the ED, mentation had a improved but he had tremor evident throughout all extremities.   Chart review: Patient does have prior admissions in the past around alcohol intoxication and confusion.  Last discharge summary 03/19/2023 noted to have severe acute metabolic encephalopathy due to DTs from alcohol withdrawal.     Physical Exam   Triage Vital Signs: ED Triage Vitals  Encounter Vitals Group     BP      Systolic BP Percentile      Diastolic BP Percentile      Pulse      Resp      Temp      Temp src      SpO2      Weight      Height      Head Circumference      Peak Flow      Pain Score      Pain Loc      Pain Education      Exclude from Growth Chart     Most recent vital signs: Vitals:   04/23/23 1915 04/23/23 1930  BP:  (!) 126/95  Pulse: (!) 121 (!) 122  Resp: (!) 24   Temp:    SpO2: 95%     I have reviewed the vital signs. General:  Awake, alert, tremors on exam with slurred speech Head:  Normocephalic, Atraumatic. EENT:  PERRL, EOMI, Oral mucosa pink and moist, Neck is supple. Cardiovascular: Regular rate, 2+ distal pulses. Respiratory:  Normal respiratory effort, symmetrical expansion, no distress.   Extremities:  Moving all four extremities through full ROM without pain.   Neuro:  Alert and oriented.  Tremors throughout all extremities.  Mildly slurred speech.  5-5 strength intact throughout all extremities.  Cranial nerves II  through XII otherwise intact.  Sensation equal and intact throughout bilateral and upper lower extremities. Skin:  Warm, dry, no rash.   Psych: Appropriate affect.    ED Results / Procedures / Treatments   Labs (all labs ordered are listed, but only abnormal results are displayed) Labs Reviewed  CBC - Abnormal; Notable for the following components:      Result Value   WBC 10.6 (*)    All other components within normal limits  COMPREHENSIVE METABOLIC PANEL - Abnormal; Notable for the following components:   Chloride 96 (*)    Glucose, Bld 186 (*)    Total Protein 8.6 (*)    All other components within normal limits  URINALYSIS, ROUTINE W REFLEX MICROSCOPIC - Abnormal; Notable for the following components:   Color, Urine YELLOW (*)    APPearance HAZY (*)    Glucose, UA 150 (*)    Hgb urine dipstick SMALL (*)    Protein, ur 30 (*)    All other components within normal limits  URINE DRUG SCREEN, QUALITATIVE ([facility] ONLY) - Abnormal; Notable for the following components:   Cannabinoid 50 Ng, Ur [employer]  POSITIVE (*)    Benzodiazepine, Ur Scrn POSITIVE (*)    All other components within normal limits  CBG MONITORING, ED - Abnormal; Notable for the following components:   Glucose-Capillary 177 (*)    All other components within normal limits  TROPONIN I (HIGH SENSITIVITY) - Abnormal; Notable for the following components:   Troponin I (High Sensitivity) 44 (*)    All other components within normal limits  PROTIME-INR  APTT  DIFFERENTIAL  ETHANOL  TSH  AMMONIA  VITAMIN B12  RPR     EKG My EKG interpretation: Rate of 118, sinus tachycardia.  Normal axis, normal intervals.  No acute ST elevations or depressions   RADIOLOGY Independently interpreted CT head and MRI of head with no acute abnormalities   PROCEDURES:  Critical Care performed: Yes, see critical care procedure note(s)  .Critical Care  Performed by: Janith Lima, MD Authorized by: Janith Lima, MD   Critical  care provider statement:    Critical care time (minutes):  30   Critical care was necessary to treat or prevent imminent or life-threatening deterioration of the following conditions:  Metabolic crisis (Metabolic encephalopathy possible DTs)   Critical care was time spent personally by me on the following activities:  Development of treatment plan with patient or surrogate, discussions with consultants, evaluation of patient's response to treatment, examination of patient, ordering and review of laboratory studies, ordering and review of radiographic studies, ordering and performing treatments and interventions, pulse oximetry, re-evaluation of patient's condition and review of old charts   I assumed direction of critical care for this patient from another provider in my specialty: no     Care discussed with: admitting provider      MEDICATIONS ORDERED IN ED: Medications  LORazepam (ATIVAN) tablet 1-4 mg ( Oral See Alternative 04/23/23 1745)    Or  LORazepam (ATIVAN) injection 1-4 mg (3 mg Intravenous Given 04/23/23 1745)  thiamine (VITAMIN B1) tablet 100 mg (100 mg Oral Given 04/23/23 1735)    Or  thiamine (VITAMIN B1) injection 100 mg ( Intravenous See Alternative 04/23/23 1735)  folic acid (FOLVITE) tablet 1 mg (1 mg Oral Given 04/23/23 1735)  multivitamin with minerals tablet 1 tablet (1 tablet Oral Given 04/23/23 1735)  LORazepam (ATIVAN) injection 2 mg (has no administration in time range)  0.9 %  sodium chloride infusion ( Intravenous New Bag/Given 04/23/23 1908)  sodium chloride flush (NS) 0.9 % injection 3 mL (3 mLs Intravenous Given 04/23/23 1734)  sodium chloride 0.9 % bolus 1,000 mL (1,000 mLs Intravenous New Bag/Given 04/23/23 1734)  metoprolol tartrate (LOPRESSOR) injection 5 mg ( Intravenous Not Given 04/23/23 1800)     IMPRESSION / MDM / ASSESSMENT AND PLAN / ED COURSE  I reviewed the triage vital signs and the nursing notes.                              Differential  diagnosis includes, but is not limited to, CVA, seizure, withdrawal seizures, alcohol abuse, metabolic encephalopathy, UTI  Patient's presentation is most consistent with acute presentation with potential threat to life or bodily function.  Patient is a 67 year old male presenting today as a stroke alert for concerns of unresponsiveness and aphasia.  Symptoms improving on arrival.  Patient taken to CT scanner where CT head was unremarkable.  Follow-up MRI with neurology shows no evidence of a stroke.  Neurology most concern for possible alcohol withdrawal seizure with prior history  of similar.  Also recommend evaluating for other areas of acute metabolic encephalopathy.  Neurology did recommend admission for observation over the next 48 hours to ensure improvement in symptoms and no subsequent seizures.  Reassessing patient, he remains tachycardic, hypertensive, and tachypneic.  Does have prior history of concern for DTs and appears he is in delirium tremens at this time.  Initially given Ativan and then given 1 dose of metoprolol due to heart rate in the 160s.  This did result in improvement.  Patient was admitted to hospitalist for further care.  Also discussed the case with ICU who agrees with current plan.  The patient is on the cardiac monitor to evaluate for evidence of arrhythmia and/or significant heart rate changes. Clinical Course as of 04/23/23 2015  Fri Apr 23, 2023  1656 Stroke workup negative.  Neurology is concern for possible alcohol withdrawal seizure.  Recommends admission for continued monitoring and further altered mental status workup.  Will collect additional laboratory workup and urine here in the ED.  Will admit to hospitalist on CIWA protocol.  If patient does have a another seizure in the hospital, they recommend transfer to [facility] for further evaluation. [DW]    Clinical Course User Index [DW] Janith Lima, MD     FINAL CLINICAL IMPRESSION(S) / ED DIAGNOSES   Final  diagnoses:  Delirium tremens (HCC)  Slurred speech  Metabolic encephalopathy     Rx / DC Orders   ED Discharge Orders     None        Note:  This document was prepared using Dragon voice recognition software and may include unintentional dictation errors.   Janith Lima, MD 04/23/23 2016

## 2023-04-23 NOTE — H&P (Incomplete)
History and Physical    Steve Kelly YQI:[id number] DOB: 1955/05/17 DOA: 04/23/2023  Referring MD/NP/PA:   PCP: Ellis Parents, FNP   Patient coming from:  The patient is coming from home.     Chief Complaint: AMS  HPI: Steve Kelly is a 67 y.o. male with medical history significant of hypertension, GERD, alcohol abuse, DT, who presents with altered mental status.   Patient has AMS and is unable to provide accurate medical history, therefore, most of the history is obtained by discussing the case with ED physician, per EMS report, and with the nursing staff.   Per report, pt was last known normal at about 2:00 p.m. Reportedly had seizure-like activity followed by unresponsiveness and aphasia. Family noticed a staring spell initially. Code stroke was called. MRI of brain is negative for stroke. When I saw pt in ED, his mental status has slightly improved, but still not following command, not orientated x 3.  He moves all extremities.  Patient is tremulous.  No active respiratory distress, cough noted.  No active nausea, vomiting, diarrhea noted.  Patient does not to have chest pain or abdominal pain. Of note, pt had severe acute metabolic encephalopathy due to DTs from alcohol withdrawal in the past.    Data reviewed independently and ED Course: pt was found to have alcohol level less than 10, WBC 10.6, GFR> 60, urinalysis not impressive, troponin 44, temperature normal, blood pressure 188/113, 114/76, heart rate of 139 --> 120s, RR 30, oxygen saturation 96% on room air.  Chest x-ray negative.  CT head negative for acute intracranial abnormalities.  MRI of the brain is negative for acute stroke, but showed small remote cerebellar infarct.  Patient is admitted to stepdown as inpatient.  Consulted ICU NP, Ouma and Dr. Wilford Corner of neuro.  MRI-brain: 1. No acute intracranial abnormality. 2. Moderately advanced cerebral atrophy with chronic microvascular ischemic disease. Few small remote  cerebellar infarcts.     EKG: I have personally reviewed.  Sinus rhythm, QTc 471, nonspecific T wave change.   Review of Systems: Could not be reviewed due to altered mental status.   Allergy: No Known Allergies  Past Medical History:  Diagnosis Date   Alcohol abuse    Heart murmur on physical examination    Hypertension    Tremor    Vitamin D deficiency     History reviewed. No pertinent surgical history.  Could not review due to altered mental status.  Social History:  reports that he has never smoked. He has never used smokeless tobacco. He reports current alcohol use. He reports that he does not use drugs.  Family History:  Family History  Problem Relation Age of Onset   Diabetes Mother    Hypertension Mother    Hyperlipidemia Mother    Hypertension Father    Heart attack Sister    Heart attack Brother      Prior to Admission medications   Medication Sig Start Date End Date Taking? Authorizing Provider  aspirin 81 MG chewable tablet Chew 1 tablet (81 mg total) by mouth daily. 03/19/23   Leroy Sea, MD  atenolol (TENORMIN) 50 MG tablet Take 50 mg by mouth daily. 09/16/22   [provider]  chlordiazePOXIDE (LIBRIUM) 5 MG capsule Give 1 pill twice a day for 1 day, then 1 pill once a day for 2 days and stop 03/19/23   Leroy Sea, MD  famotidine (PEPCID) 20 MG tablet Take 1 tablet (20 mg total) by  mouth daily. 03/19/23   Leroy Sea, MD  folic acid (FOLVITE) 1 MG tablet Take 1 tablet (1 mg total) by mouth daily. 03/19/23   Leroy Sea, MD  thiamine (VITAMIN B-1) 100 MG tablet Take 1 tablet (100 mg total) by mouth daily. 03/19/23   Leroy Sea, MD  Vitamin D, Ergocalciferol, (DRISDOL) 1.25 MG (50000 UNIT) CAPS capsule Take 1 capsule (50,000 Units total) by mouth every 7 (seven) days. 03/19/23   Leroy Sea, MD    Physical Exam: Vitals:   04/24/23 0000 04/24/23 0030 04/24/23 0045 04/24/23 0100  BP: (!) 146/127 (!) 142/114 (!)  140/83 111/77  Pulse:  (!) 115    Resp: (!) 27  (!) 27 (!) 29  Temp:      TempSrc:      SpO2: 99%  97% 98%  Weight:       General: Not in acute distress.  Patient is tremulous HEENT:       Eyes: PERRL, EOMI, no jaundice       ENT: No discharge from the ears and nose       Neck: No JVD, no bruit, no mass felt. Heme: No neck lymph node enlargement. Cardiac: S1/S2, RRR, No gallops or rubs. Respiratory: No rales, wheezing, rhonchi or rubs. GI: Soft, nondistended, nontender, no organomegaly, BS present. GU: No hematuria Ext: No pitting leg edema bilaterally. 1+DP/PT pulse bilaterally. Musculoskeletal: No joint deformities, No joint redness or warmth, no limitation of ROM in spin. Skin: No rashes.  Neuro: Patient is altered, not following command, arousable, not oriented x 3, cranial nerves II-XII grossly intact, moves all extremities. Psych: Patient is not psychotic, no suicidal or hemocidal ideation.  Labs on Admission: I have personally reviewed following labs and imaging studies  CBC: Recent Labs  Lab 04/23/23 1601  WBC 10.6*  NEUTROABS 6.0  HGB 14.8  HCT 43.9  MCV 97.6  PLT 211   Basic Metabolic Panel: Recent Labs  Lab 04/23/23 1601  NA 135  K 3.5  CL 96*  CO2 27  GLUCOSE 186*  BUN 9  CREATININE 1.20  CALCIUM 9.2   GFR: Estimated Creatinine Clearance: 55.6 mL/min (by C-G formula based on SCr of 1.2 mg/dL). Liver Function Tests: Recent Labs  Lab 04/23/23 1601  AST 29  ALT 11  ALKPHOS 85  BILITOT 1.0  PROT 8.6*  ALBUMIN 4.2   No results for input(s): "LIPASE", "AMYLASE" in the last 168 hours. Recent Labs  Lab 04/23/23 1724  AMMONIA 30   Coagulation Profile: Recent Labs  Lab 04/23/23 1601  INR 1.1   Cardiac Enzymes: No results for input(s): "CKTOTAL", "CKMB", "CKMBINDEX", "TROPONINI" in the last 168 hours. BNP (last 3 results) No results for input(s): "PROBNP" in the last 8760 hours. HbA1C: No results for input(s): "HGBA1C" in the last 72  hours. CBG: Recent Labs  Lab 04/23/23 1556  GLUCAP 177*   Lipid Profile: Recent Labs    04/23/23 1724  CHOL 187  HDL 52  LDLCALC 119*  TRIG 79  CHOLHDL 3.6   Thyroid Function Tests: Recent Labs    04/23/23 1724  TSH 0.975   Anemia Panel: No results for input(s): "VITAMINB12", "FOLATE", "FERRITIN", "TIBC", "IRON", "RETICCTPCT" in the last 72 hours. Urine analysis:    Component Value Date/Time   COLORURINE YELLOW (A) 04/23/2023 1702   APPEARANCEUR HAZY (A) 04/23/2023 1702   LABSPEC 1.012 04/23/2023 1702   PHURINE 6.0 04/23/2023 1702   GLUCOSEU 150 (A) 04/23/2023 1702  HGBUR SMALL (A) 04/23/2023 1702   BILIRUBINUR NEGATIVE 04/23/2023 1702   KETONESUR NEGATIVE 04/23/2023 1702   PROTEINUR 30 (A) 04/23/2023 1702   NITRITE NEGATIVE 04/23/2023 1702   LEUKOCYTESUR NEGATIVE 04/23/2023 1702   Sepsis Labs: @LABRCNTIP (procalcitonin:4,lacticidven:4) )No results found for this or any previous visit (from the past 240 hours).   Radiological Exams on Admission: DG Chest Portable 1 View Result Date: 04/23/2023 CLINICAL DATA:  Shortness of breath and tachycardia EXAM: PORTABLE CHEST 1 VIEW COMPARISON:  06/29/2009 FINDINGS: The heart size and mediastinal contours are within normal limits. Both lungs are clear. The visualized skeletal structures are unremarkable. IMPRESSION: No active disease. Electronically Signed   By: Minerva Fester M.D.   On: 04/23/2023 19:56   MR BRAIN WO CONTRAST Result Date: 04/23/2023 CLINICAL DATA:  Follow-up exam pronation for stroke. EXAM: MRI HEAD WITHOUT CONTRAST TECHNIQUE: Multiplanar, multiecho pulse sequences of the brain and surrounding structures were obtained without intravenous contrast. COMPARISON:  Prior CT from earlier the same day. FINDINGS: Brain: Examination degraded by motion artifact. Mildly advanced cerebral atrophy. Patchy T2/FLAIR hyperintensity involving the periventricular deep white matter both cerebral hemispheres, consistent with  chronic small vessel ischemic disease, moderately advanced. Few small remote cerebellar infarcts noted. No evidence for acute or subacute ischemia. No acute or chronic intracranial blood products. No mass lesion, midline shift or mass effect. No hydrocephalus or extra-axial fluid collection. Pituitary gland suprasellar region grossly within normal limits. Vascular: Major intracranial vascular flow voids are maintained. Skull and upper cervical spine: Craniocervical junction with level limits. Bone marrow signal intensity normal. No scalp soft tissue abnormality. Sinuses/Orbits: Globes and orbital soft tissues within normal limits. Right maxillary sinus retention cyst. Paranasal sinuses are otherwise clear. Trace right mastoid effusion, of doubtful significance. Other: None. IMPRESSION: 1. No acute intracranial abnormality. 2. Moderately advanced cerebral atrophy with chronic microvascular ischemic disease. Few small remote cerebellar infarcts. Electronically Signed   By: Rise Mu M.D.   On: 04/23/2023 17:52   CT HEAD CODE STROKE WO CONTRAST Result Date: 04/23/2023 CLINICAL DATA:  Code stroke.  Neuro deficit acute, stroke suspected. EXAM: CT HEAD WITHOUT CONTRAST TECHNIQUE: Contiguous axial images were obtained from the base of the skull through the vertex without intravenous contrast. RADIATION DOSE REDUCTION: This exam was performed according to the departmental dose-optimization program which includes automated exposure control, adjustment of the mA and/or kV according to patient size and/or use of iterative reconstruction technique. COMPARISON:  CT head without contrast 03/14/2023 FINDINGS: Brain: No acute infarct, hemorrhage, or mass lesion is present. Moderately advanced atrophy and white matter changes are similar to the prior exam. Deep brain nuclei are within normal limits. The ventricles are of normal size. No significant extraaxial fluid collection is present. The brainstem and cerebellum  are within normal limits. Midline structures are within normal limits. Vascular: No hyperdense vessel or unexpected calcification. Skull: Calvarium is intact. No focal lytic or blastic lesions are present. No significant extracranial soft tissue lesion is present. Sinuses/Orbits: The paranasal sinuses and mastoid air cells are clear. The globes and orbits are within normal limits. ASPECTS [facility] Stroke Program Early CT Score) - Ganglionic level infarction (caudate, lentiform nuclei, internal capsule, insula, M1-M3 cortex): 7/7 - Supraganglionic infarction (M4-M6 cortex): 3/3 Total score (0-10 with 10 being normal): 10/10 IMPRESSION: 1. No acute intracranial abnormality or significant interval change. 2. Moderately advanced atrophy and white matter disease. This likely reflects the sequela of chronic microvascular ischemia. These results were called by telephone at the time of interpretation on  04/23/2023 at 4:14 pm to provider Milon Dikes, who verbally acknowledged these results. Electronically Signed   By: Marin Roberts M.D.   On: 04/23/2023 16:15      Assessment/Plan Principal Problem:   Acute metabolic encephalopathy Active Problems:   Alcohol withdrawal (HCC)   Delirium tremens (HCC)   Hypertension   Myocardial injury   Leukocytosis   Assessment and Plan:   Acute metabolic encephalopathy: MRI of brain is negative for stroke. Consulted Dr. Wilford Corner of neuro. He suspects possible underlying alcohol use related encephalopathy versus alcohol withdrawal seizures.  Pt may have DT. Also consulted ICU NP, Ouma.  -admit to SDU as inpt -Frequent neurocheck -Fall precaution -N.p.o. -Hold all home oral medications until mental status improves -Seizure precaution -As needed Ativan for seizure  -Per Dr. Wilford Corner, "do not have the facilities for getting EEGs over the weekend but if he exhibits any seizure-like activity or concern for it, will transfer him to [facility] for spot and  long-term EEG as needed" - Check B12, TSH, RPR for reversible causes of dementia per Dr. Wilford Corner.  Alcohol withdrawal and Delirium tremens (HCC): CIWA score > 20.  Consulted ICU PA, Ouma. She has started patient on phenobarbital tapering.  She recommends to start patient on Librium 25 mg 3 times daily and continue prn Ativan. -on CIWA protocol -Give 500 mg of vitamin B1 -Folic acid -As needed Ativan -Phenobarbital tapering  Hypertension -IV hydralazine as needed  Myocardial injury: trop  44 --> 73.  Likely demand ischemia. -Trend troponin -Check A1c, FLP -Aspirin 81 mg daily  Leukocytosis: Mild leukocytosis, WBC 10.6.  No source of infection identified.  Likely reactive -Follow-up by CBC       DVT ppx: SQ Lovenox  Code Status: Full code    Family Communication: not done, no family member is at bed side.     Disposition Plan:  Anticipate discharge back to previous environment  Consults called: Consulted ICU NP, Ouma and Dr. Wilford Corner of neuro.  Admission status and Level of care: Stepdown:    as inpt       Dispo: The patient is from: Home              Anticipated d/c is to: Home              Anticipated d/c date is: 2 days              Patient currently is not medically stable to d/c.    Severity of Illness:  The appropriate patient status for this patient is INPATIENT. Inpatient status is judged to be reasonable and necessary in order to provide the required intensity of service to ensure the patient's safety. The patient's presenting symptoms, physical exam findings, and initial radiographic and laboratory data in the context of their chronic comorbidities is felt to place them at high risk for further clinical deterioration. Furthermore, it is not anticipated that the patient will be medically stable for discharge from the hospital within 2 midnights of admission.   * I certify that at the point of admission it is my clinical judgment that the patient will require  inpatient hospital care spanning beyond 2 midnights from the point of admission due to high intensity of service, high risk for further deterioration and high frequency of surveillance required.*       Date of Service 04/24/2023    Lorretta Harp [facility]   If 7PM-7AM, please contact night-coverage [email or web address] 04/24/2023, 1:52 AM

## 2023-04-24 DIAGNOSIS — F10931 Alcohol use, unspecified with withdrawal delirium: Secondary | ICD-10-CM | POA: Diagnosis not present

## 2023-04-24 DIAGNOSIS — I10 Essential (primary) hypertension: Secondary | ICD-10-CM | POA: Diagnosis not present

## 2023-04-24 DIAGNOSIS — I5A Non-ischemic myocardial injury (non-traumatic): Secondary | ICD-10-CM | POA: Diagnosis not present

## 2023-04-24 DIAGNOSIS — G9341 Metabolic encephalopathy: Secondary | ICD-10-CM | POA: Diagnosis not present

## 2023-04-24 DIAGNOSIS — R7989 Other specified abnormal findings of blood chemistry: Secondary | ICD-10-CM

## 2023-04-24 DIAGNOSIS — D72829 Elevated white blood cell count, unspecified: Secondary | ICD-10-CM

## 2023-04-24 HISTORY — DX: Other specified abnormal findings of blood chemistry: R79.89

## 2023-04-24 LAB — CBC
HCT: 36.1 % — ABNORMAL LOW (ref 39.0–52.0)
Hemoglobin: 12.5 g/dL — ABNORMAL LOW (ref 13.0–17.0)
MCH: 32.8 pg (ref 26.0–34.0)
MCHC: 34.6 g/dL (ref 30.0–36.0)
MCV: 94.8 fL (ref 80.0–100.0)
Platelets: 184 10*3/uL (ref 150–400)
RBC: 3.81 MIL/uL — ABNORMAL LOW (ref 4.22–5.81)
RDW: 13.5 % (ref 11.5–15.5)
WBC: 11.7 10*3/uL — ABNORMAL HIGH (ref 4.0–10.5)
nRBC: 0 % (ref 0.0–0.2)

## 2023-04-24 LAB — HEMOGLOBIN A1C
Hgb A1c MFr Bld: 5.3 % (ref 4.8–5.6)
Mean Plasma Glucose: 105.41 mg/dL

## 2023-04-24 LAB — BASIC METABOLIC PANEL
Anion gap: 9 (ref 5–15)
BUN: 12 mg/dL (ref 8–23)
CO2: 25 mmol/L (ref 22–32)
Calcium: 8.9 mg/dL (ref 8.9–10.3)
Chloride: 106 mmol/L (ref 98–111)
Creatinine, Ser: 1.09 mg/dL (ref 0.61–1.24)
GFR, Estimated: >60 mL/min (ref >60)
Glucose, Bld: 107 mg/dL — ABNORMAL HIGH (ref 70–99)
Potassium: 3.6 mmol/L (ref 3.5–5.1)
Sodium: 140 mmol/L (ref 135–145)

## 2023-04-24 LAB — VITAMIN B12: Vitamin B-12: 193 pg/mL (ref 180–914)

## 2023-04-24 LAB — TROPONIN I (HIGH SENSITIVITY)
Troponin I (High Sensitivity): 56 ng/L — ABNORMAL HIGH (ref <18)
Troponin I (High Sensitivity): 118 ng/L (ref <18)
Troponin I (High Sensitivity): 117 ng/L (ref <18)
Troponin I (High Sensitivity): 95 ng/L — ABNORMAL HIGH (ref <18)
Troponin I (High Sensitivity): 98 ng/L — ABNORMAL HIGH (ref <18)

## 2023-04-24 LAB — BRAIN NATRIURETIC PEPTIDE: B Natriuretic Peptide: 209 pg/mL — ABNORMAL HIGH (ref 0.0–100.0)

## 2023-04-24 LAB — RPR: RPR Ser Ql: NONREACTIVE

## 2023-04-24 LAB — HEPARIN LEVEL (UNFRACTIONATED)
Heparin Unfractionated: 0.15 [IU]/mL — ABNORMAL LOW (ref 0.30–0.70)
Heparin Unfractionated: <0.1 [IU]/mL — ABNORMAL LOW (ref 0.30–0.70)

## 2023-04-24 LAB — APTT: aPTT: 28 s (ref 24–36)

## 2023-04-24 LAB — PROTIME-INR
INR: 1.3 — ABNORMAL HIGH (ref 0.8–1.2)
Prothrombin Time: 16.4 s — ABNORMAL HIGH (ref 11.4–15.2)

## 2023-04-24 LAB — CBG MONITORING, ED: Glucose-Capillary: 90 mg/dL (ref 70–99)

## 2023-04-24 LAB — GLUCOSE, CAPILLARY: Glucose-Capillary: 87 mg/dL (ref 70–99)

## 2023-04-24 MED ORDER — AMIODARONE IV BOLUS ONLY 150 MG/100ML: 150.0000 mg | Freq: Once | INTRAVENOUS | Status: DC

## 2023-04-24 MED ORDER — AMIODARONE HCL IN DEXTROSE 360-4.14 MG/200ML-% IV SOLN: 30.0000 mg/h | INTRAVENOUS | Status: DC

## 2023-04-24 MED ORDER — VITAMIN B-12 1000 MCG PO TABS
1000.0000 ug | ORAL_TABLET | Freq: Every day | ORAL | Status: DC
Start: 2023-04-25 — End: 2023-05-07
  Administered 2023-04-26 – 2023-05-07 (×12): 1000 ug via ORAL
  Filled 2023-04-24 (×13): qty 1

## 2023-04-24 MED ORDER — HEPARIN (PORCINE) 25000 UT/250ML-% IV SOLN
950.0000 [IU]/h | INTRAVENOUS | Status: DC
  Administered 2023-04-24: 750 [IU]/h via INTRAVENOUS
  Filled 2023-04-24: qty 250

## 2023-04-24 MED ORDER — AMIODARONE HCL IN DEXTROSE 360-4.14 MG/200ML-% IV SOLN: 60.0000 mg/h | INTRAVENOUS | Status: DC

## 2023-04-24 MED ORDER — HEPARIN BOLUS VIA INFUSION
1900.0000 [IU] | Freq: Once | INTRAVENOUS | Status: AC
  Administered 2023-04-24: 1900 [IU] via INTRAVENOUS
  Filled 2023-04-24: qty 1900

## 2023-04-24 MED ORDER — HEPARIN BOLUS VIA INFUSION
3800.0000 [IU] | Freq: Once | INTRAVENOUS | Status: AC
Start: 2023-04-24 — End: 2023-04-24
  Administered 2023-04-24: 3800 [IU] via INTRAVENOUS
  Filled 2023-04-24: qty 3800

## 2023-04-24 MED ORDER — AMIODARONE LOAD VIA INFUSION
150.0000 mg | Freq: Once | INTRAVENOUS | Status: DC
  Filled 2023-04-24: qty 83.34

## 2023-04-24 MED ORDER — SODIUM CHLORIDE 0.9 % IV SOLN: INTRAVENOUS | Status: AC

## 2023-04-24 MED ORDER — ASPIRIN 81 MG PO TBEC
81.0000 mg | DELAYED_RELEASE_TABLET | Freq: Every day | ORAL | Status: DC
  Administered 2023-04-26 – 2023-05-07 (×12): 81 mg via ORAL
  Filled 2023-04-24 (×13): qty 1

## 2023-04-24 MED ORDER — CYANOCOBALAMIN 1000 MCG/ML IJ SOLN
1000.0000 ug | Freq: Once | INTRAMUSCULAR | Status: AC
  Administered 2023-04-24: 1000 ug via INTRAMUSCULAR
  Filled 2023-04-24: qty 1

## 2023-04-24 NOTE — Assessment & Plan Note (Signed)
-  IV hydralazine as needed

## 2023-04-24 NOTE — ED Notes (Signed)
This RN talked in person with Dr. Nelson Chimes. At this time plan to not give PRN ativan, unless pt becomes increasingly agitated, regardless of CWAW score (currently 10). Pt alert to speech, pt able to state first name and age.

## 2023-04-24 NOTE — ED Notes (Signed)
Hospitalist notified of pt critical troponin, no new orders at this time.

## 2023-04-24 NOTE — Hospital Course (Signed)
Taken from H&P.  Steve Kelly is a 67 y.o. male with medical history significant of hypertension, GERD, alcohol abuse, DT, who presents with altered mental status.   Per report, pt was last known normal at about 2:00 p.m. Reportedly had seizure-like activity followed by unresponsiveness and aphasia. Family noticed a staring spell initially. Code stroke was called.  Stat CT head and MRI brain was negative for stroke.  CT cervical spine was also negative for any acute abnormality.  It was thought to be due to alcohol withdrawal with seizure and DTs, he was started on CIWA protocol.  He was also evaluated by PCCM but does not require an ICU admission at this time.  Patient had elevated blood pressure and tachycardia along with tachypnea on presentation.  Labs with WBC 10.4, UA was not consistent with UTI, troponin 44. UDS positive for cannabinoid.  Alcohol level less than 10.  12/14: Overall stable vitals with mild tachycardia, slowly rising troponin, currently at 118 after showing some improvement.  Starting on heparin infusion. Labs with B12 of 193, lipid panel with LDL of 119, ammonia of 30, TSH 0.975, A1c of 5.3, slight worsening of leukocytosis at 11.7 Cardiology was consulted and echo ordered.  12/15: Patient became febrile with maximum temperature recorded of 101.5 overnight.  Labs with resolution of prior leukocytosis, troponin peaked at 118, CIWA score of 14, echocardiogram done with pending results Repeat chest x-ray with worsening right mid to lower zone airspace disease, patient with worsening cough, starting on ceftriaxone and Zithromax for concern of pneumonia.  Ordered blood and sputum culture, strep pneumo and Legionella antigen, RVP and procalcitonin-which was elevated at 4.05. Repeat UA with few ketones only.  12/16: Afebrile this morning with maximum temperature recorded of 102.3 over the past 24-hour.  Leukocytosis resolved, developed hypoglycemia. preliminary blood cultures  negative, strep pneumo, RVP and MRSA swab negative.  Patient more alert and able to take p.o. so starting on diet. Echocardiogram with normal EF but found to have severe aortic stenosis which need further evaluation with right and left cardiac catheterization and thoracic surgery evaluation once mental status recovers.

## 2023-04-24 NOTE — Consult Note (Signed)
PHARMACY - ANTICOAGULATION CONSULT NOTE  Pharmacy Consult for Heparin Indication: chest pain/ACS  No Known Allergies  Patient Measurements: Weight: 64 kg (141 lb 1.5 oz) Heparin Dosing Weight: 64 kg   Vital Signs: Temp: 98 F (36.7 C) (12/14 0430) BP: 113/79 (12/14 0700) Pulse Rate: 118 (12/14 0430)  Labs: Recent Labs    04/23/23 1601 04/23/23 1901 04/23/23 2149 04/24/23 0037 04/24/23 0500  HGB 14.8  --   --   --  12.5*  HCT 43.9  --   --   --  36.1*  PLT 211  --   --   --  184  APTT 28  --   --   --   --   LABPROT 14.0  --   --   --   --   INR 1.1  --   --   --   --   CREATININE 1.20  --   --   --  1.09  TROPONINIHS  --    < > 73* 56* 118*   < > = values in this interval not displayed.   Estimated Creatinine Clearance: 61.2 mL/min (by C-G formula based on SCr of 1.09 mg/dL).  Medical History: Past Medical History:  Diagnosis Date   Alcohol abuse    Heart murmur on physical examination    Hypertension    Tremor    Vitamin D deficiency    Medications:  No PTA anticoagulation  Received a one-time prophylactic dose of Lovenox 12/13 @ 2249   Assessment: Steve Kelly is a 67 year old male that presented 04/23/23 as a code stroke. PMH is significant for EtOH abuse, DTs, hypertension, and GERD. Patient was evaluated by neurology who determined patient not a candidate for IV thrombolytic basis due to low suspicion for stroke. From chart review, patient is not on any PTA anticoagulation. He did receive a one-time prophylactic dose of Lovenox 12/13 @ 2249. Patient with elevated troponin of 118. Pharmacy has been consulted for initiation and management of a heparin infusion for ACS. Baseline labs: Hgb 12.5, PLT 184, aPTT and PT/INR ordered.    Goal of Therapy:  Heparin level 0.3-0.7 units/ml Monitor platelets by anticoagulation protocol: Yes   Plan:  Give bolus of 3800 units x 1 Start heparin infusion at 760 units/hr  Check HL 6 hours after initiation of infusion   Monitor CBC daily while on heparin  Littie Deeds, PharmD Pharmacy Resident  04/24/2023 8:06 AM

## 2023-04-24 NOTE — Progress Notes (Signed)
Progress Note   Patient: Steve Kelly:[id number] DOB: Oct 28, 1955 DOA: 04/23/2023     1 DOS: the patient was seen and examined on 04/24/2023   Brief hospital course: Taken from H&P.  [name] is a 67 y.o. male with medical history significant of hypertension, GERD, alcohol abuse, DT, who presents with altered mental status.   Per report, pt was last known normal at about 2:00 p.m. Reportedly had seizure-like activity followed by unresponsiveness and aphasia. Family noticed a staring spell initially. Code stroke was called.  Stat CT head and MRI brain was negative for stroke.  CT cervical spine was also negative for any acute abnormality.  It was thought to be due to alcohol withdrawal with seizure and DTs, he was started on CIWA protocol.  He was also evaluated by PCCM but does not require an ICU admission at this time.  Patient had elevated blood pressure and tachycardia along with tachypnea on presentation.  Labs with WBC 10.4, UA was not consistent with UTI, troponin 44. UDS positive for cannabinoid.  Alcohol level less than 10.  12/14: Overall stable vitals with mild tachycardia, slowly rising troponin, currently at 118 after showing some improvement.  Starting on heparin infusion. Labs with B12 of 193, lipid panel with LDL of 119, ammonia of 30, TSH 0.975, A1c of 5.3, slight worsening of leukocytosis at 11.7 Cardiology was consulted and echo ordered.    Assessment and Plan: * Acute metabolic encephalopathy MRI of brain is negative for stroke. Consulted Dr. Wilford Corner of neuro. He suspects possible underlying alcohol use related encephalopathy versus alcohol withdrawal seizures.  Pt may have DT.  PCCM also evaluated him but he does not need ICU admission. B12 borderline low at 192-ordered supplement. TSH, RPR and ammonia levels within normal limit. -Continue with CIWA protocol and phenobarbital taper -Monitor volume status closely -EEG was also ordered   Delirium  tremens [name]) Patient has history of DTs.  Might had a alcohol withdrawal seizure as there was no prior history of seizures and MRI is negative. -Continue to monitor  Alcohol withdrawal (HCC) CIWA score of 11 today. -Continue with CIWA protocol with Ativan -Continue with phenobarbital -Continue to monitor  Hypertension -IV hydralazine as needed  Myocardial injury Slowly worsening troponin, can still be demand ischemia but cardiology was consulted and he was started on heparin infusion. -Echocardiogram ordered -Continue with daily aspirin  Leukocytosis Slight worsening of leukocytosis, remained afebrile.  No obvious source of infection. -Continue to monitor   Subjective: Patient hardly open eyes on stimulus, sometimes babble few word, not following any commands.  Physical Exam: Vitals:   04/24/23 1215 04/24/23 1230 04/24/23 1245 04/24/23 1300  BP: 132/75 136/80 130/80 (!) 144/95  Pulse:      Resp: (!) 24 (!) 25 (!) 26 (!) 25  Temp:      TempSrc:      SpO2: 97% 96% 95% 99%  Weight:      Height:       General.  Frail and lethargic gentleman, in no acute distress. Pulmonary.  Lungs clear bilaterally, normal respiratory effort. CV.  Regular rate and rhythm, positive murmur Abdomen.  Soft, nontender, nondistended, BS positive. CNS.  Lethargic, open eyes but not follow any other commands, moving extremities Extremities.  No edema, no cyanosis, pulses intact and symmetrical.   Data Reviewed: Prior data reviewed  Family Communication: Discussed with daughter on phone  Disposition: Status is: Inpatient Remains inpatient appropriate because: Severity of illness  Planned Discharge Destination:  To be determined  Time spent: 50 minutes  This record has been created using Conservation officer, historic buildings. Errors have been sought and corrected,but may not always be located. Such creation errors do not reflect on the standard of care.   Author: Arnetha Courser,  MD 04/24/2023 1:18 PM  For on call review [email or web address].

## 2023-04-24 NOTE — Assessment & Plan Note (Signed)
MRI of brain is negative for stroke. Consulted Dr. Wilford Corner of neuro. He suspects possible underlying alcohol use related encephalopathy versus alcohol withdrawal seizures.  Pt may have DT.  PCCM also evaluated him but he does not need ICU admission. B12 borderline low at 192-ordered supplement. TSH, RPR and ammonia levels within normal limit. -Continue with CIWA protocol and phenobarbital taper -Monitor volume status closely -EEG was also ordered

## 2023-04-24 NOTE — Assessment & Plan Note (Signed)
CIWA score of 11 today. -Continue with CIWA protocol with Ativan -Continue with phenobarbital -Continue to monitor

## 2023-04-24 NOTE — Consult Note (Signed)
PHARMACY - ANTICOAGULATION CONSULT NOTE  Pharmacy Consult for Heparin Indication: chest pain/ACS  No Known Allergies  Patient Measurements: Height: 5' 9.02" (175.3 cm) Weight: 64 kg (141 lb 1.5 oz) IBW/kg (Calculated) : 70.74 Heparin Dosing Weight: 64 kg   Vital Signs: Temp: 98 F (36.7 C) (12/14 0430) BP: 159/109 (12/14 1500) Pulse Rate: 122 (12/14 1455)  Labs: Recent Labs    04/23/23 1601 04/23/23 1901 04/24/23 0500 04/24/23 0832 04/24/23 1151 04/24/23 1509  HGB 14.8  --  12.5*  --   --   --   HCT 43.9  --  36.1*  --   --   --   PLT 211  --  184  --   --   --   APTT 28  --   --  28  --   --   LABPROT 14.0  --   --  16.4*  --   --   INR 1.1  --   --  1.3*  --   --   HEPARINUNFRC  --   --   --   --   --  0.15*  CREATININE 1.20  --  1.09  --   --   --   TROPONINIHS  --    < > 118* 117* 95* 98*   < > = values in this interval not displayed.   Estimated Creatinine Clearance: 61.2 mL/min (by C-G formula based on SCr of 1.09 mg/dL).  Medical History: Past Medical History:  Diagnosis Date   Alcohol abuse    Heart murmur on physical examination    Hypertension    Tremor    Vitamin D deficiency    Medications:  No PTA anticoagulation  Received a one-time prophylactic dose of Lovenox 12/13 @ 2249   Assessment: Steve Kelly is a 67 year old male that presented 04/23/23 as a code stroke. PMH is significant for EtOH abuse, DTs, hypertension, and GERD. Patient was evaluated by neurology who determined patient not a candidate for IV thrombolytic basis due to low suspicion for stroke. From chart review, patient is not on any PTA anticoagulation. He did receive a one-time prophylactic dose of Lovenox 12/13 @ 2249. Patient with elevated troponin of 118. Pharmacy has been consulted for initiation and management of a heparin infusion for ACS. Baseline labs: Hgb 12.5, PLT 184, aPTT and PT/INR ordered.    12/14 15:09  HL 0.15  Goal of Therapy:  Heparin level 0.3-0.7  units/ml Monitor platelets by anticoagulation protocol: Yes   Plan:  -Heparin 1900 units IV bolus -Increase Heparin rate to 950 units/hr -Next HL in 6 hours -Daily CBC while on Heparin infusion  Clovia Cuff, PharmD, BCPS 04/24/2023 3:47 PM

## 2023-04-24 NOTE — TOC Initial Note (Signed)
Transition of Care [facility]) - Initial/Assessment Note    Patient Details  Name: Steve Kelly MRN: [id number] Date of Birth: 1956-03-21  [facility]) CM/SW Contact:    Colette Ribas, LCSWA Phone Number: 04/24/2023, 12:13 PM  Clinical Narrative:                      CSW received a consult for SA resources. SA resources added to AVS.   Patient Goals and CMS Choice            Expected Discharge Plan and Services                                              Prior Living Arrangements/Services                       Activities of Daily Living      Permission Sought/Granted                  Emotional Assessment              Admission diagnosis:  Acute metabolic encephalopathy [G93.41] Patient Active Problem List   Diagnosis Date Noted   Acute metabolic encephalopathy 04/23/2023   Delirium tremens (HCC) 04/23/2023   GERD (gastroesophageal reflux disease) 04/23/2023   Myocardial injury 04/23/2023   Leukocytosis 04/23/2023   Alcohol withdrawal (HCC) 03/14/2023   Acute alteration in mental status 03/14/2023   Generalized weakness 03/14/2023   History of alcohol use disorder 03/14/2023   Thrombocytopenia (HCC) 03/14/2023   Hypokalemia 03/14/2023   Alcohol abuse 10/09/2022   Heart murmur, systolic 10/09/2022   Hypertension 10/09/2022   Tremor 10/09/2022   Vitamin D deficiency 10/09/2022   PCP:  Ellis Parents, FNP Pharmacy:   [facility] [phone] - [place], [place] - [address] AT [facility] [address] [place] [address] Phone: [phone] Fax: [phone]     Social Drivers of Health (SDOH) Social History: SDOH Screenings   Food Insecurity: Patient Unable To Answer (03/15/2023)  Housing: Patient Unable To Answer (03/15/2023)  Transportation Needs: Patient Unable To Answer (03/15/2023)  Utilities: Patient Unable To Answer (03/15/2023)  Tobacco Use: Low Risk  (04/23/2023)    SDOH Interventions:     Readmission Risk Interventions     No data to display

## 2023-04-24 NOTE — Assessment & Plan Note (Signed)
Slight worsening of leukocytosis, remained afebrile.  No obvious source of infection. -Continue to monitor

## 2023-04-24 NOTE — ED Notes (Signed)
Advised nurse that patient has ready bed 

## 2023-04-24 NOTE — Assessment & Plan Note (Signed)
Patient has history of DTs.  Might had a alcohol withdrawal seizure as there was no prior history of seizures and MRI is negative. -Continue to monitor

## 2023-04-24 NOTE — Consult Note (Signed)
Cardiology Consult    Patient ID: Steve BOCCUZZI MRN: [id number], DOB/AGE: [birth year]   Admit date: 04/23/2023 Date of Consult: 04/24/2023  Primary Physician: Ellis Parents, FNP Primary Cardiologist: Marlyn Corporal Madireddy, MD Requesting Provider: Kathie Rhodes. Nelson Chimes, MD  Patient Profile    Steve Kelly is a 67 y.o. male with a history of HTN, remote tobacco abuse (quit age 20), alcohol abuse, and cardiac murmur, who is being seen today for the evaluation of elevated troponin in the setting of altered mental status at the request of Dr. Nelson Chimes.  Past Medical History  Subjective  Past Medical History:  Diagnosis Date   Alcohol abuse    Heart murmur on physical examination    Hypertension    Tremor    Vitamin D deficiency     History reviewed. No pertinent surgical history.   Allergies  No Known Allergies    History of Present Illness     67 y.o. male with a history of HTN, remote tobacco abuse (quit age 41), alcohol abuse, and cardiac murmur.  He was recently admitted to [facility] in the setting of AMS, metabolic encephalopathy, DTs/etoh withdrawal, AKI, and mild rhabdo.  He was d/c'd to SNF on 11/8.  In the setting of finding of systolic murmur on exam in the outpt setting, he was seen by Dr. Vincent Gros in [place], on 11/12.  Echo was ordered, though not yet performed.   Patient is currently resting in bed and disoriented to time and place and is unable to provide any meaningful input into this interview.  Notes indicate that family noted him to be unresponsive and staring blankly at approximately 2 PM on December 13.  He was taken to the emergency department.  On arrival, he was afebrile, tachycardic (sinus 120's to 130's), and hypertensive (158/107).  ECG showed Sinus tachycardia, 122, BAE, LVH w/ repol abnormalities.  Labs notable for mild hypochloremia (96), mild leukocytosis (10.6), and hyperglycemia (186).  HsTrops mildly elevated @ 44  73  56  118.  UDS + for benzos and THC.  UA  hazy, but not bacteria and nitrite neg.  Head CT w/ advanced atrophy and white matter dzs but w/o acute abnormality.  MRI brain w/ chronic microvascular ischemic dzs, remote cerebellar infarcts, no acute abnormality.  We have been asked to evaluate secondary to elevated troponin.  Patient currently runs a little bit in response to questions.  When asked where he is, he replied [name], and subsequently stopped answering questions.  Inpatient Medications  Subjective    aspirin EC  81 mg Oral Daily   chlordiazePOXIDE  25 mg Oral TID   cyanocobalamin  1,000 mcg Intramuscular Once   [START ON 04/25/2023] vitamin B-12  1,000 mcg Oral Daily   folic acid  1 mg Oral Daily   multivitamin with minerals  1 tablet Oral Daily   PHENObarbital  97.5 mg Intravenous Q8H   Followed by   Melene Muller ON 04/26/2023] PHENObarbital  65 mg Intravenous Q8H   Followed by   Melene Muller ON 04/28/2023] PHENObarbital  32.5 mg Intravenous Q8H   thiamine  100 mg Oral Daily   Or   thiamine  100 mg Intravenous Daily    Family History-obtained from medical record as patient is unable to provide at this time    Family History  Problem Relation Age of Onset   Diabetes Mother    Hypertension Mother    Hyperlipidemia Mother    Hypertension Father    Heart attack Sister  Heart attack Brother    He indicated that the status of his mother is unknown. He indicated that the status of his father is unknown. He indicated that the status of his sister is unknown. He indicated that the status of his brother is unknown.   Social History-obtained from medical record as patient is unable to provide at this time    Social History   Socioeconomic History   Marital status: Single    Spouse name: Not on file   Number of children: Not on file   Years of education: Not on file   Highest education level: Not on file  Occupational History   Not on file  Tobacco Use   Smoking status: Never   Smokeless tobacco: Never  Vaping Use    Vaping status: Never Used  Substance and Sexual Activity   Alcohol use: Yes    Comment: 1 pint/day   Drug use: No   Sexual activity: Not Currently  Other Topics Concern   Not on file  Social History Narrative   Not on file   Social Drivers of Health   Financial Resource Strain: Not on file  Food Insecurity: Patient Unable To Answer (03/15/2023)   Hunger Vital Sign    Worried About Running Out of Food in the Last Year: Patient unable to answer    Ran Out of Food in the Last Year: Patient unable to answer  Transportation Needs: Patient Unable To Answer (03/15/2023)   PRAPARE - Transportation    Lack of Transportation (Medical): Patient unable to answer    Lack of Transportation (Non-Medical): Patient unable to answer  Physical Activity: Not on file  Stress: Not on file  Social Connections: Not on file  Intimate Partner Violence: Patient Unable To Answer (03/15/2023)   Humiliation, Afraid, Rape, and Kick questionnaire    Fear of Current or Ex-Partner: Patient unable to answer    Emotionally Abused: Patient unable to answer    Physically Abused: Patient unable to answer    Sexually Abused: Patient unable to answer     Review of Systems-patient unable to provide at this time    Patient currently denies chest pain and is unable to provide any other information related to review of systems.    Objective  Physical Exam    Blood pressure (!) 127/59, pulse (!) 118, temperature 98 F (36.7 C), resp. rate (!) 21, height 5' 9.02" (1.753 m), weight 64 kg, SpO2 99%.  General: Grunts most responses.  Relatively sedate. Psych: Flat affect. Neuro: Disoriented to time and place. Moves all extremities spontaneously.  Intermittently follows commands. HEENT: Normal  Neck: Supple without bruits or JVD. Lungs:  Resp regular and unlabored, diminished breath sounds bilat - poor effort. Heart: cardiac exam difficult due to pt positioning (fetal position, curled up).  RRR no s3, s4, I don't  appreciate any murmurs today. Abdomen: Soft, non-tender, non-distended, BS + x 4.  Extremities: No clubbing, cyanosis or edema. DP/PT2+, Radials 2+ and equal bilaterally.  Labs    Cardiac Enzymes Recent Labs  Lab 04/23/23 1901 04/23/23 2149 04/24/23 0037 04/24/23 0500 04/24/23 0832  TROPONINIHS 44* 73* 56* 118* 117*      Lab Results  Component Value Date   WBC 11.7 (H) 04/24/2023   HGB 12.5 (L) 04/24/2023   HCT 36.1 (L) 04/24/2023   MCV 94.8 04/24/2023   PLT 184 04/24/2023    Recent Labs  Lab 04/23/23 1601 04/24/23 0500  NA 135 140  K 3.5 3.6  CL 96* 106  CO2 27 25  BUN 9 12  CREATININE 1.20 1.09  CALCIUM 9.2 8.9  PROT 8.6*  --   BILITOT 1.0  --   ALKPHOS 85  --   ALT 11  --   AST 29  --   GLUCOSE 186* 107*   Lab Results  Component Value Date   CHOL 187 04/23/2023   HDL 52 04/23/2023   LDLCALC 119 (H) 04/23/2023   TRIG 79 04/23/2023     Radiology Studies    DG Chest Portable 1 View Result Date: 04/23/2023 CLINICAL DATA:  Shortness of breath and tachycardia EXAM: PORTABLE CHEST 1 VIEW COMPARISON:  06/29/2009 FINDINGS: The heart size and mediastinal contours are within normal limits. Both lungs are clear. The visualized skeletal structures are unremarkable. IMPRESSION: No active disease. Electronically Signed   By: Minerva Fester M.D.   On: 04/23/2023 19:56   MR BRAIN WO CONTRAST Result Date: 04/23/2023 CLINICAL DATA:  Follow-up exam pronation for stroke. EXAM: MRI HEAD WITHOUT CONTRAST TECHNIQUE: Multiplanar, multiecho pulse sequences of the brain and surrounding structures were obtained without intravenous contrast. COMPARISON:  Prior CT from earlier the same day. FINDINGS: Brain: Examination degraded by motion artifact. Mildly advanced cerebral atrophy. Patchy T2/FLAIR hyperintensity involving the periventricular deep white matter both cerebral hemispheres, consistent with chronic small vessel ischemic disease, moderately advanced. Few small remote  cerebellar infarcts noted. No evidence for acute or subacute ischemia. No acute or chronic intracranial blood products. No mass lesion, midline shift or mass effect. No hydrocephalus or extra-axial fluid collection. Pituitary gland suprasellar region grossly within normal limits. Vascular: Major intracranial vascular flow voids are maintained. Skull and upper cervical spine: Craniocervical junction with level limits. Bone marrow signal intensity normal. No scalp soft tissue abnormality. Sinuses/Orbits: Globes and orbital soft tissues within normal limits. Right maxillary sinus retention cyst. Paranasal sinuses are otherwise clear. Trace right mastoid effusion, of doubtful significance. Other: None. IMPRESSION: 1. No acute intracranial abnormality. 2. Moderately advanced cerebral atrophy with chronic microvascular ischemic disease. Few small remote cerebellar infarcts. Electronically Signed   By: Rise Mu M.D.   On: 04/23/2023 17:52   CT HEAD CODE STROKE WO CONTRAST Result Date: 04/23/2023 CLINICAL DATA:  Code stroke.  Neuro deficit acute, stroke suspected. EXAM: CT HEAD WITHOUT CONTRAST TECHNIQUE: Contiguous axial images were obtained from the base of the skull through the vertex without intravenous contrast. RADIATION DOSE REDUCTION: This exam was performed according to the departmental dose-optimization program which includes automated exposure control, adjustment of the mA and/or kV according to patient size and/or use of iterative reconstruction technique. COMPARISON:  CT head without contrast 03/14/2023 FINDINGS: Brain: No acute infarct, hemorrhage, or mass lesion is present. Moderately advanced atrophy and white matter changes are similar to the prior exam. Deep brain nuclei are within normal limits. The ventricles are of normal size. No significant extraaxial fluid collection is present. The brainstem and cerebellum are within normal limits. Midline structures are within normal limits.  Vascular: No hyperdense vessel or unexpected calcification. Skull: Calvarium is intact. No focal lytic or blastic lesions are present. No significant extracranial soft tissue lesion is present. Sinuses/Orbits: The paranasal sinuses and mastoid air cells are clear. The globes and orbits are within normal limits. ASPECTS [facility] Stroke Program Early CT Score) - Ganglionic level infarction (caudate, lentiform nuclei, internal capsule, insula, M1-M3 cortex): 7/7 - Supraganglionic infarction (M4-M6 cortex): 3/3 Total score (0-10 with 10 being normal): 10/10 IMPRESSION: 1. No acute intracranial abnormality or significant interval  change. 2. Moderately advanced atrophy and white matter disease. This likely reflects the sequela of chronic microvascular ischemia. These results were called by telephone at the time of interpretation on 04/23/2023 at 4:14 pm to provider Milon Dikes, who verbally acknowledged these results. Electronically Signed   By: Marin Roberts M.D.   On: 04/23/2023 16:15      ECG & Cardiac Imaging    Sinus tachycardia, 122, BAE, LVH w/ repol abnormalities - personally reviewed.  Assessment & Plan    1.  Elevated troponin: Per notes, patient brought to the emergency department due to altered mental status.  Trop mildly elevated w/ relatively flat trend 44  73  56  118.  Pt is minimally responsive at this time but replies "no" when asked if he is having or had chest pain.  ECG w/o acute ST/T changes.  Cont heparin.  F/u echo.  Further recs pending echo.  Cont asa.  Will add low-dose ? blocker.  2.  AMS:  No stroke on CT/MRI.  Long h/o etoh abuse w/ recent admission for DTs.  UDS + benzos/THC.  ETOH < 10.  Per medicine.  3.  HTN:  stable.  4. ETOH abuse/H/o DTs: ETOH < 10.  Risk Assessment/Risk Scores:     TIMI Risk Score for Unstable Angina or Non-ST Elevation MI:   The patient's TIMI risk score is  , which indicates a  % risk of all cause mortality, new or recurrent myocardial  infarction or need for urgent revascularization in the next 14 days.      Signed, Nicolasa Ducking, NP 04/24/2023, 11:54 AM  For questions or updates, please contact   Please consult [email or web address] for contact info under Cardiology/STEMI.

## 2023-04-24 NOTE — Assessment & Plan Note (Signed)
Slowly worsening troponin, can still be demand ischemia but cardiology was consulted and he was started on heparin infusion. -Echocardiogram ordered -Continue with daily aspirin

## 2023-04-24 NOTE — ED Notes (Signed)
Report called to ICU nurse, pt accepted.

## 2023-04-24 NOTE — Progress Notes (Addendum)
67 year old male with significant past medical history of EtOH abuse, DTs, hypertension, GERD, who presented to the ED with altered mental status.  Per ED reports, family reported they noticed a staring spell initially then patient had seizure-like activity followed by unresponsiveness and aphasia.  Reported last known normal 2 PM.  EMS was called and code stroke activated on route.  ED course: Hypertensive and tachycardic with initial NIHSS score of 2.  Labs significant for K+ 3.5, NA 135, creatinine 1.20, glucose 186 CO2 27, AST/ALT 29/11, bilirubin 1.0, WBC 10.6, Hgb 12.1, platelet 100, ammonia 30, negative ethanol, mag 1.5, phos 4.3, CK4 30, PT/INR 14.0/1.1, UDS positive for cannabinoids. CTH without acute intracranial abnormality CT cervical spine also without acute findings.  Patient was evaluated by neurology who determined patient not a candidate for IV thrombolytic basis due to low suspicion for stroke.  Stat MRI negative for stroke.  Patient was started on CIWA protocol and hospitalist was consulted for admission.  Patient remained agitated and PCCM asked to evaluate at the bedside.  On evaluation at the bedside @ 2000 on 04/23/23, patient is hemodynamically stable and protecting his airway.  Suspect possible severe acute metabolic encephalopathy due to DTs from alcohol withdrawal complicated by alcohol withdrawal seizures.  Patient does not require critical care needs at this time but would recommend continuing with CIWA per protocol, initiating phenobarbital taper and Librium.  On reassessment@ 4.00 a.m. on 04/24/23 post phenobarb administered last night at 2050, patient appears to be resting comfortably.  He has not required any additional benzos since last dose administered at 2025.  Would recommend continuing with phenobarb taper and Librium once able to tolerate p.o. monitor for any signs and symptoms of worsening DTs.  Please reconsult PCCM with any critical care needs, at this time there is  no indication for PCCM to consult.    Webb Silversmith, DNP, CCRN, FNP-C, AGACNP-BC Acute Care & Family Nurse Practitioner  [facility]  See Amion for personal pager PCCM on call pager [phone] until 7 am

## 2023-04-25 ENCOUNTER — Inpatient Hospital Stay (HOSPITAL_COMMUNITY)
Admit: 2023-04-25 | Discharge: 2023-04-25 | Disposition: A | Discharge status: Home / Self Care | Payer: Medicare Other | Attending: Internal Medicine | Admitting: Internal Medicine

## 2023-04-25 ENCOUNTER — Inpatient Hospital Stay: Payer: Medicare Other

## 2023-04-25 DIAGNOSIS — I350 Nonrheumatic aortic (valve) stenosis: Secondary | ICD-10-CM

## 2023-04-25 DIAGNOSIS — F10931 Alcohol use, unspecified with withdrawal delirium: Secondary | ICD-10-CM | POA: Diagnosis not present

## 2023-04-25 DIAGNOSIS — J189 Pneumonia, unspecified organism: Secondary | ICD-10-CM

## 2023-04-25 DIAGNOSIS — I5A Non-ischemic myocardial injury (non-traumatic): Secondary | ICD-10-CM | POA: Diagnosis not present

## 2023-04-25 DIAGNOSIS — I10 Essential (primary) hypertension: Secondary | ICD-10-CM | POA: Diagnosis not present

## 2023-04-25 DIAGNOSIS — R9431 Abnormal electrocardiogram [ECG] [EKG]: Secondary | ICD-10-CM | POA: Diagnosis not present

## 2023-04-25 DIAGNOSIS — R7989 Other specified abnormal findings of blood chemistry: Secondary | ICD-10-CM | POA: Diagnosis not present

## 2023-04-25 DIAGNOSIS — G9341 Metabolic encephalopathy: Secondary | ICD-10-CM | POA: Diagnosis not present

## 2023-04-25 HISTORY — DX: Pneumonia, unspecified organism: J18.9

## 2023-04-25 HISTORY — DX: Nonrheumatic aortic (valve) stenosis: I35.0

## 2023-04-25 LAB — ECHOCARDIOGRAM COMPLETE
AR max vel: 0.7 cm2
AV Area VTI: 0.75 cm2
AV Area mean vel: 0.63 cm2
AV Mean grad: 39 mm[Hg]
AV Peak grad: 64.5 mm[Hg]
Ao pk vel: 4.02 m/s
Area-P 1/2: 4.99 cm2
Height: 69.016 in
P 1/2 time: 436 ms
S' Lateral: 2.4 cm
Weight: 2257.51 [oz_av]

## 2023-04-25 LAB — CBC
HCT: 32.8 % — ABNORMAL LOW (ref 39.0–52.0)
Hemoglobin: 11.3 g/dL — ABNORMAL LOW (ref 13.0–17.0)
MCH: 32.5 pg (ref 26.0–34.0)
MCHC: 34.5 g/dL (ref 30.0–36.0)
MCV: 94.3 fL (ref 80.0–100.0)
Platelets: 162 10*3/uL (ref 150–400)
RBC: 3.48 MIL/uL — ABNORMAL LOW (ref 4.22–5.81)
RDW: 13.3 % (ref 11.5–15.5)
WBC: 7.5 10*3/uL (ref 4.0–10.5)
nRBC: 0 % (ref 0.0–0.2)

## 2023-04-25 LAB — URINALYSIS, COMPLETE (UACMP) WITH MICROSCOPIC
Bacteria, UA: NONE SEEN
Bilirubin Urine: NEGATIVE
Glucose, UA: NEGATIVE mg/dL
Hgb urine dipstick: NEGATIVE
Ketones, ur: 5 mg/dL — AB
Leukocytes,Ua: NEGATIVE
Nitrite: NEGATIVE
Protein, ur: NEGATIVE mg/dL
Specific Gravity, Urine: 1.023 (ref 1.005–1.030)
pH: 6 (ref 5.0–8.0)

## 2023-04-25 LAB — BASIC METABOLIC PANEL
Anion gap: 8 (ref 5–15)
BUN: 12 mg/dL (ref 8–23)
CO2: 24 mmol/L (ref 22–32)
Calcium: 8.8 mg/dL — ABNORMAL LOW (ref 8.9–10.3)
Chloride: 106 mmol/L (ref 98–111)
Creatinine, Ser: 0.89 mg/dL (ref 0.61–1.24)
GFR, Estimated: >60 mL/min (ref >60)
Glucose, Bld: 81 mg/dL (ref 70–99)
Potassium: 3.5 mmol/L (ref 3.5–5.1)
Sodium: 138 mmol/L (ref 135–145)

## 2023-04-25 LAB — RESPIRATORY PANEL BY PCR

## 2023-04-25 LAB — STREP PNEUMONIAE URINARY ANTIGEN: Strep Pneumo Urinary Antigen: NEGATIVE

## 2023-04-25 LAB — MRSA NEXT GEN BY PCR, NASAL: MRSA by PCR Next Gen: NOT DETECTED

## 2023-04-25 LAB — PROCALCITONIN: Procalcitonin: 4.05 ng/mL

## 2023-04-25 LAB — GLUCOSE, CAPILLARY: Glucose-Capillary: 78 mg/dL (ref 70–99)

## 2023-04-25 MED ORDER — HYDRALAZINE HCL 20 MG/ML IJ SOLN
10.0000 mg | INTRAMUSCULAR | Status: DC | PRN
  Filled 2023-04-25: qty 1

## 2023-04-25 MED ORDER — SODIUM CHLORIDE 0.9 % IV SOLN
500.0000 mg | INTRAVENOUS | Status: AC
  Administered 2023-04-25 – 2023-04-29 (×5): 500 mg via INTRAVENOUS
  Filled 2023-04-25 (×5): qty 5

## 2023-04-25 MED ORDER — ATENOLOL 25 MG PO TABS
50.0000 mg | ORAL_TABLET | Freq: Every day | ORAL | Status: DC
  Administered 2023-04-26 – 2023-04-27 (×2): 50 mg via ORAL
  Filled 2023-04-25 (×2): qty 2

## 2023-04-25 MED ORDER — SODIUM CHLORIDE 0.9 % IV SOLN
2.0000 g | INTRAVENOUS | Status: DC
  Administered 2023-04-25 – 2023-04-29 (×5): 2 g via INTRAVENOUS
  Filled 2023-04-25 (×6): qty 20

## 2023-04-25 MED ORDER — ENOXAPARIN SODIUM 40 MG/0.4ML IJ SOSY
40.0000 mg | PREFILLED_SYRINGE | INTRAMUSCULAR | Status: DC
  Administered 2023-04-25 – 2023-05-07 (×13): 40 mg via SUBCUTANEOUS
  Filled 2023-04-25 (×13): qty 0.4

## 2023-04-25 MED ORDER — CHLORHEXIDINE GLUCONATE CLOTH 2 % EX PADS
6.0000 | MEDICATED_PAD | Freq: Every evening | CUTANEOUS | Status: DC
  Administered 2023-04-26 – 2023-04-27 (×3): 6 via TOPICAL

## 2023-04-25 NOTE — Plan of Care (Signed)

## 2023-04-25 NOTE — Progress Notes (Signed)
  Echocardiogram 2D Echocardiogram has been performed.  Steve Kelly 04/25/2023, 9:19 AM

## 2023-04-25 NOTE — Assessment & Plan Note (Signed)
Blood pressure mildly elevated -IV hydralazine as needed while n.p.o. -Will restart home atenolol once able to take by mouth

## 2023-04-25 NOTE — Assessment & Plan Note (Signed)
Remained febrile overnight, repeat chest x-ray with concern of right mid to lower lobe infiltrate.  Preliminary blood cultures negative, sputum cultures not done yet. Repeat UA with only few ketones. -Starting on ceftriaxone and Zithromax -Urinary strep negative and Legionella antigen pending -Supportive care

## 2023-04-25 NOTE — Assessment & Plan Note (Signed)
MRI of brain is negative for stroke. Consulted Dr. Wilford Corner of neuro. He suspects possible underlying alcohol use related encephalopathy versus alcohol withdrawal seizures.  Pt may have DT.  Slowly improving and becoming little more alert PCCM also evaluated him but he does not need ICU admission. B12 borderline low at 192-ordered supplement. TSH, RPR and ammonia levels within normal limit. -Continue with CIWA protocol and phenobarbital taper -Monitor volume status closely -EEG was also ordered

## 2023-04-25 NOTE — Assessment & Plan Note (Signed)
CIWA score of 7 today.  Becoming little more alert. -Continue with CIWA protocol with Ativan -Continue with phenobarbital -Continue to monitor

## 2023-04-25 NOTE — Assessment & Plan Note (Signed)
Leukocytosis improved today but patient became febrile, likely had pneumonia. -Continue to monitor

## 2023-04-25 NOTE — Progress Notes (Signed)
Rounding Note    Patient Name: Steve Kelly Date of Encounter: 04/25/2023  [facility] Cardiologist: Marlyn Corporal Madireddy, MD   Subjective   Patient still very lethargic.  Being treated for possible alcohol withdrawal and encephalopathy.  Inpatient Medications    Scheduled Meds:  aspirin EC  81 mg Oral Daily   atenolol  50 mg Oral Daily   chlordiazePOXIDE  25 mg Oral TID   Chlorhexidine Gluconate Cloth  6 each Topical Nightly   vitamin B-12  1,000 mcg Oral Daily   enoxaparin (LOVENOX) injection  40 mg Subcutaneous Q24H   folic acid  1 mg Oral Daily   multivitamin with minerals  1 tablet Oral Daily   PHENObarbital  97.5 mg Intravenous Q8H   Followed by   [START ON 04/26/2023] PHENObarbital  65 mg Intravenous Q8H   Followed by   Melene Muller ON 04/28/2023] PHENObarbital  32.5 mg Intravenous Q8H   thiamine  100 mg Oral Daily   Or   thiamine  100 mg Intravenous Daily   Continuous Infusions:  azithromycin     cefTRIAXone (ROCEPHIN)  IV 2 g (04/25/23 1333)   PRN Meds: acetaminophen, hydrALAZINE, LORazepam **OR** LORazepam, LORazepam, ondansetron (ZOFRAN) IV   Vital Signs    Vitals:   04/25/23 0720 04/25/23 0800 04/25/23 0900 04/25/23 1200  BP: 123/74 (!) 143/88 (!) 133/90 (!) 160/90  Pulse: (!) 111 (!) 102 (!) 108 89  Resp: (!) 24 (!) 27 (!) 21 (!) 23  Temp:   (!) 100.9 F (38.3 C) 99.6 F (37.6 C)  TempSrc:   Oral Oral  SpO2: 94% 95% 95% 98%  Weight:      Height:        Intake/Output Summary (Last 24 hours) at 04/25/2023 1351 Last data filed at 04/25/2023 0800 Gross per 24 hour  Intake 1515.67 ml  Output 600 ml  Net 915.67 ml      04/24/2023    7:00 AM 04/23/2023    9:28 PM 03/23/2023    3:27 PM  Last 3 Weights  Weight (lbs) 141 lb 1.5 oz 141 lb 1.5 oz 146 lb 3.2 oz  Weight (kg) 64 kg 64 kg 66.316 kg      Telemetry    Sinus rhythm, heart rate 96- Personally Reviewed  ECG     - Personally Reviewed  Physical Exam   GEN:  Lethargic, does not follow commands. Neck: No JVD Cardiac: RRR, 3/6 systolic murmur Respiratory: Diminished breath sounds bilaterally, no wheezing GI: Soft, nontender, non-distended  MS: No edema; No deformity. Neuro: Unable to assess Psych: Unable to assess  Labs    High Sensitivity Troponin:   Recent Labs  Lab 04/24/23 0037 04/24/23 0500 04/24/23 0832 04/24/23 1151 04/24/23 1509  TROPONINIHS 56* 118* 117* 95* 98*     Chemistry Recent Labs  Lab 04/23/23 1601 04/24/23 0500 04/25/23 0258  NA 135 140 138  K 3.5 3.6 3.5  CL 96* 106 106  CO2 27 25 24   GLUCOSE 186* 107* 81  BUN 9 12 12   CREATININE 1.20 1.09 0.89  CALCIUM 9.2 8.9 8.8*  PROT 8.6*  --   --   ALBUMIN 4.2  --   --   AST 29  --   --   ALT 11  --   --   ALKPHOS 85  --   --   BILITOT 1.0  --   --   GFRNONAA >60 >60 >60  ANIONGAP 12 9 8  Lipids  Recent Labs  Lab 04/23/23 1724  CHOL 187  TRIG 79  HDL 52  LDLCALC 119*  CHOLHDL 3.6    Hematology Recent Labs  Lab 04/23/23 1601 04/24/23 0500 04/25/23 0258  WBC 10.6* 11.7* 7.5  RBC 4.50 3.81* 3.48*  HGB 14.8 12.5* 11.3*  HCT 43.9 36.1* 32.8*  MCV 97.6 94.8 94.3  MCH 32.9 32.8 32.5  MCHC 33.7 34.6 34.5  RDW 13.3 13.5 13.3  PLT 211 184 162   Thyroid  Recent Labs  Lab 04/23/23 1724  TSH 0.975    BNP Recent Labs  Lab 04/24/23 0500  BNP 209.0*    DDimer No results for input(s): "DDIMER" in the last 168 hours.   Radiology    ECHOCARDIOGRAM COMPLETE Result Date: 04/25/2023    ECHOCARDIOGRAM REPORT   Patient Name:   Steve Kelly Date of Exam: 04/25/2023 Medical Rec #:  [id number]        Height:       69.0 in Accession #:    [id number]       Weight:       141.1 lb Date of Birth:  01/20/1956        BSA:          1.781 m Patient Age:    67 years         BP:           142/92 mmHg Patient Gender: M                HR:           114 bpm. Exam Location:  [facility] Procedure: 2D Echo Indications:     Abnormal ECG R94.31  History:         Patient has  no prior history of Echocardiogram examinations.  Sonographer:     Overton Mam RDCS, FASE Referring Phys:  [id number] AMIN Diagnosing Phys: Debbe Odea MD  Sonographer Comments: Image acquisition challenging due to uncooperative patient. IMPRESSIONS  1. Left ventricular ejection fraction, by estimation, is 55 to 60%. The left ventricle has normal function. The left ventricle has no regional wall motion abnormalities. There is mild left ventricular hypertrophy. Left ventricular diastolic parameters are consistent with Grade I diastolic dysfunction (impaired relaxation).  2. Right ventricular systolic function is normal. The right ventricular size is normal.  3. The mitral valve is normal in structure. No evidence of mitral valve regurgitation.  4. Mean aortic valve gradient , peak gradient , Vmax 4.1 m/s, AVA 0.74cm2.. The aortic valve is calcified. Aortic valve regurgitation is not visualized. Severe aortic valve stenosis.  5. The inferior vena cava is normal in size with <50% respiratory variability, suggesting right atrial pressure of 8 mmHg. Conclusion(s)/Recommendation(s): Findings consistent with severe valvular heart disease. FINDINGS  Left Ventricle: Left ventricular ejection fraction, by estimation, is 55 to 60%. The left ventricle has normal function. The left ventricle has no regional wall motion abnormalities. The left ventricular internal cavity size was normal in size. There is  mild left ventricular hypertrophy. Left ventricular diastolic parameters are consistent with Grade I diastolic dysfunction (impaired relaxation). Right Ventricle: The right ventricular size is normal. No increase in right ventricular wall thickness. Right ventricular systolic function is normal. Left Atrium: Left atrial size was normal in size. Right Atrium: Right atrial size was normal in size. Pericardium: There is no evidence of pericardial effusion. Mitral Valve: The mitral valve is normal in  structure. No evidence of mitral valve regurgitation. Tricuspid Valve:  The tricuspid valve is normal in structure. Tricuspid valve regurgitation is mild. Aortic Valve: Mean aortic valve gradient , peak gradient , Vmax 4.1 m/s, AVA 0.74cm2. The aortic valve is calcified. Aortic valve regurgitation is not visualized. Aortic regurgitation PHT measures 436 msec. Severe aortic stenosis is present. Aortic valve mean gradient measures 39.0 mmHg. Aortic valve peak gradient measures 64.5 mmHg. Aortic valve area, by VTI measures 0.75 cm. Pulmonic Valve: The pulmonic valve was normal in structure. Pulmonic valve regurgitation is not visualized. Aorta: The aortic root and ascending aorta are structurally normal, with no evidence of dilitation. Venous: The inferior vena cava is normal in size with less than 50% respiratory variability, suggesting right atrial pressure of 8 mmHg. IAS/Shunts: No atrial level shunt detected by color flow Doppler.  LEFT VENTRICLE PLAX 2D LVIDd:         3.80 cm   Diastology LVIDs:         2.40 cm   LV e' medial:    8.27 cm/s LV PW:         1.40 cm   LV E/e' medial:  9.1 LV IVS:        1.10 cm   LV e' lateral:   6.53 cm/s LVOT diam:     1.90 cm   LV E/e' lateral: 11.5 LV SV:         57 LV SV Index:   32 LVOT Area:     2.84 cm  RIGHT VENTRICLE RV Basal diam:  2.20 cm RV S prime:     19.30 cm/s TAPSE (M-mode): 1.8 cm LEFT ATRIUM           Index        RIGHT ATRIUM           Index LA diam:      2.80 cm 1.57 cm/m   RA Area:     10.40 cm LA Vol (A4C): 26.7 ml 14.99 ml/m  RA Volume:   17.70 ml  9.94 ml/m  AORTIC VALVE                     PULMONIC VALVE AV Area (Vmax):    0.70 cm      PV Vmax:       0.61 m/s AV Area (Vmean):   0.63 cm      PV Peak grad:  1.5 mmHg AV Area (VTI):     0.75 cm AV Vmax:           401.50 cm/s AV Vmean:          291.500 cm/s AV VTI:            0.755 m AV Peak Grad:      64.5 mmHg AV Mean Grad:      39.0 mmHg LVOT Vmax:         99.80 cm/s LVOT Vmean:         64.500 cm/s LVOT VTI:          0.201 m LVOT/AV VTI ratio: 0.27 AI PHT:            436 msec  AORTA Ao Root diam: 3.40 cm Ao Asc diam:  3.10 cm MITRAL VALVE               TRICUSPID VALVE MV Area (PHT): 4.99 cm    TV Peak grad:   28.0 mmHg MV Decel Time: 152 msec    TV Vmax:  2.64 m/s MV E velocity: 74.90 cm/s MV A velocity: 94.90 cm/s  SHUNTS MV E/A ratio:  0.79        Systemic VTI:  0.20 m                            Systemic Diam: 1.90 cm Debbe Odea MD Electronically signed by Debbe Odea MD Signature Date/Time: 04/25/2023/1:49:23 PM    Final    DG Chest Port 1 View Result Date: 04/25/2023 CLINICAL DATA:  Fever EXAM: PORTABLE CHEST - 1 VIEW COMPARISON:  04/23/2023 FINDINGS: Worsening airspace disease in the right mid and lower lung. Persistent left retrocardiac opacity. Heart size and mediastinal contours are within normal limits. Blunting right lateral costophrenic angle suggesting small effusion. Visualized bones unremarkable. IMPRESSION: Worsening right mid and lower lung airspace disease. Electronically Signed   By: Corlis Leak M.D.   On: 04/25/2023 11:54   DG Chest Portable 1 View Result Date: 04/23/2023 CLINICAL DATA:  Shortness of breath and tachycardia EXAM: PORTABLE CHEST 1 VIEW COMPARISON:  06/29/2009 FINDINGS: The heart size and mediastinal contours are within normal limits. Both lungs are clear. The visualized skeletal structures are unremarkable. IMPRESSION: No active disease. Electronically Signed   By: Minerva Fester M.D.   On: 04/23/2023 19:56   MR BRAIN WO CONTRAST Result Date: 04/23/2023 CLINICAL DATA:  Follow-up exam pronation for stroke. EXAM: MRI HEAD WITHOUT CONTRAST TECHNIQUE: Multiplanar, multiecho pulse sequences of the brain and surrounding structures were obtained without intravenous contrast. COMPARISON:  Prior CT from earlier the same day. FINDINGS: Brain: Examination degraded by motion artifact. Mildly advanced cerebral atrophy. Patchy T2/FLAIR  hyperintensity involving the periventricular deep white matter both cerebral hemispheres, consistent with chronic small vessel ischemic disease, moderately advanced. Few small remote cerebellar infarcts noted. No evidence for acute or subacute ischemia. No acute or chronic intracranial blood products. No mass lesion, midline shift or mass effect. No hydrocephalus or extra-axial fluid collection. Pituitary gland suprasellar region grossly within normal limits. Vascular: Major intracranial vascular flow voids are maintained. Skull and upper cervical spine: Craniocervical junction with level limits. Bone marrow signal intensity normal. No scalp soft tissue abnormality. Sinuses/Orbits: Globes and orbital soft tissues within normal limits. Right maxillary sinus retention cyst. Paranasal sinuses are otherwise clear. Trace right mastoid effusion, of doubtful significance. Other: None. IMPRESSION: 1. No acute intracranial abnormality. 2. Moderately advanced cerebral atrophy with chronic microvascular ischemic disease. Few small remote cerebellar infarcts. Electronically Signed   By: Rise Mu M.D.   On: 04/23/2023 17:52   CT HEAD CODE STROKE WO CONTRAST Result Date: 04/23/2023 CLINICAL DATA:  Code stroke.  Neuro deficit acute, stroke suspected. EXAM: CT HEAD WITHOUT CONTRAST TECHNIQUE: Contiguous axial images were obtained from the base of the skull through the vertex without intravenous contrast. RADIATION DOSE REDUCTION: This exam was performed according to the departmental dose-optimization program which includes automated exposure control, adjustment of the mA and/or kV according to patient size and/or use of iterative reconstruction technique. COMPARISON:  CT head without contrast 03/14/2023 FINDINGS: Brain: No acute infarct, hemorrhage, or mass lesion is present. Moderately advanced atrophy and white matter changes are similar to the prior exam. Deep brain nuclei are within normal limits. The ventricles  are of normal size. No significant extraaxial fluid collection is present. The brainstem and cerebellum are within normal limits. Midline structures are within normal limits. Vascular: No hyperdense vessel or unexpected calcification. Skull: Calvarium is intact. No focal lytic or blastic  lesions are present. No significant extracranial soft tissue lesion is present. Sinuses/Orbits: The paranasal sinuses and mastoid air cells are clear. The globes and orbits are within normal limits. ASPECTS [facility] Stroke Program Early CT Score) - Ganglionic level infarction (caudate, lentiform nuclei, internal capsule, insula, M1-M3 cortex): 7/7 - Supraganglionic infarction (M4-M6 cortex): 3/3 Total score (0-10 with 10 being normal): 10/10 IMPRESSION: 1. No acute intracranial abnormality or significant interval change. 2. Moderately advanced atrophy and white matter disease. This likely reflects the sequela of chronic microvascular ischemia. These results were called by telephone at the time of interpretation on 04/23/2023 at 4:14 pm to provider Milon Dikes, who verbally acknowledged these results. Electronically Signed   By: Marin Roberts M.D.   On: 04/23/2023 16:15    Cardiac Studies   TTE 04/25/2023 1. Left ventricular ejection fraction, by estimation, is 55 to 60%. The  left ventricle has normal function. The left ventricle has no regional  wall motion abnormalities. There is mild left ventricular hypertrophy.  Left ventricular diastolic parameters  are consistent with Grade I diastolic dysfunction (impaired relaxation).   2. Right ventricular systolic function is normal. The right ventricular  size is normal.   3. The mitral valve is normal in structure. No evidence of mitral valve  regurgitation.   4. Mean aortic valve gradient , peak gradient , Vmax 4.1 m/s,  AVA 0.74cm2.. The aortic valve is calcified. Aortic valve regurgitation is  not visualized. Severe aortic valve stenosis.   5.  The inferior vena cava is normal in size with <50% respiratory  variability, suggesting right atrial pressure of 8 mmHg.   Patient Profile     67 y.o. male with history of hypertension, EtOH abuse, systolic murmur presenting with altered mental status, diagnosed with encephalopathy likely from alcohol withdrawal, being seen for severe aortic stenosis on echocardiogram and elevated troponins.   Assessment & Plan    Severe aortic stenosis. -Plan right and left heart cath when mental status improves. -Could be performed as outpatient. -CT surgery consult as outpatient upon discharge. -EF normal at 55 to 60%  2.  Hypertension -Atenolol 50 mg daily. -Episodic BP elevation likely from withdrawal.  3.  Minimally elevated troponins -Not consistent with ACS   4. Encephalopathy, EtOH abuse -Management as per primary team -Currently on CIWA protocol     Signed, Debbe Odea, MD  04/25/2023, 1:51 PM

## 2023-04-25 NOTE — Assessment & Plan Note (Signed)
Troponin peaked at 118, likely demand ischemia.  Echocardiogram with normal EF but did show severe aortic stenosis Cardiology stopped heparin infusion -Continue with daily aspirin

## 2023-04-25 NOTE — Progress Notes (Signed)
Progress Note   Patient: Steve Kelly ZOX:[id number] DOB: 12/26/1955 DOA: 04/23/2023     2 DOS: the patient was seen and examined on 04/25/2023   Brief hospital course: Taken from H&P.  KIMO WALLO is a 67 y.o. male with medical history significant of hypertension, GERD, alcohol abuse, DT, who presents with altered mental status.   Per report, pt was last known normal at about 2:00 p.m. Reportedly had seizure-like activity followed by unresponsiveness and aphasia. Family noticed a staring spell initially. Code stroke was called.  Stat CT head and MRI brain was negative for stroke.  CT cervical spine was also negative for any acute abnormality.  It was thought to be due to alcohol withdrawal with seizure and DTs, he was started on CIWA protocol.  He was also evaluated by PCCM but does not require an ICU admission at this time.  Patient had elevated blood pressure and tachycardia along with tachypnea on presentation.  Labs with WBC 10.4, UA was not consistent with UTI, troponin 44. UDS positive for cannabinoid.  Alcohol level less than 10.  12/14: Overall stable vitals with mild tachycardia, slowly rising troponin, currently at 118 after showing some improvement.  Starting on heparin infusion. Labs with B12 of 193, lipid panel with LDL of 119, ammonia of 30, TSH 0.975, A1c of 5.3, slight worsening of leukocytosis at 11.7 Cardiology was consulted and echo ordered.  12/15: Patient became febrile with maximum temperature recorded of 101.5 overnight.  Labs with resolution of prior leukocytosis, troponin peaked at 118, CIWA score of 14, echocardiogram done with pending results Repeat chest x-ray with worsening right mid to lower zone airspace disease, patient with worsening cough, starting on ceftriaxone and Zithromax for concern of pneumonia.  Ordered blood and sputum culture, strep pneumo and Legionella antigen, RVP and procalcitonin-which was elevated at 4.05. Repeat UA with few ketones  only.   Assessment and Plan: * Acute metabolic encephalopathy MRI of brain is negative for stroke. Consulted Dr. Wilford Corner of neuro. He suspects possible underlying alcohol use related encephalopathy versus alcohol withdrawal seizures.  Pt may have DT.  Remained quite encephalopathic PCCM also evaluated him but he does not need ICU admission. B12 borderline low at 192-ordered supplement. TSH, RPR and ammonia levels within normal limit. -Continue with CIWA protocol and phenobarbital taper -Monitor volume status closely -EEG was also ordered   CAP (community acquired pneumonia) Overnight became febrile with some vague cough.  Repeat chest x-ray with concern of right mid to lower lobe infiltrate. Repeat UA with only few ketones. -Starting on ceftriaxone and Zithromax -Blood and sputum culture -Urinary strep and Legionella antigen -Supportive care  Delirium tremens [facility]) Patient has history of DTs.  Might had a alcohol withdrawal seizure as there was no prior history of seizures and MRI is negative. -Continue to monitor  Alcohol withdrawal (HCC) CIWA score of 14 today.  Remained quite encephalopathic but seems like protecting airway. -Continue with CIWA protocol with Ativan -Continue with phenobarbital -Continue to monitor  Hypertension Blood pressure mildly elevated -IV hydralazine as needed while n.p.o. -Will restart home atenolol once able to take by mouth  Myocardial injury Troponin peaked at 118, likely demand ischemia.  Echocardiogram done with pending results. Cardiology stopped heparin infusion -Continue with daily aspirin  Leukocytosis Leukocytosis improved today but patient became febrile, likely had pneumonia. -Continue to monitor   Subjective: Patient remained encephalopathic and not following commands.  Physical Exam: Vitals:   04/25/23 0720 04/25/23 0800 04/25/23 0900 04/25/23 1200  BP: 123/74 (!) 143/88 (!) 133/90 (!) 160/90  Pulse: (!) 111 (!) 102 (!) 108  89  Resp: (!) 24 (!) 27 (!) 21 (!) 23  Temp:   (!) 100.9 F (38.3 C) 99.6 F (37.6 C)  TempSrc:   Oral Oral  SpO2: 94% 95% 95% 98%  Weight:      Height:       General.  Lethargic gentleman, in no acute distress. Pulmonary.  Lungs clear bilaterally, normal respiratory effort. CV.  Regular rate and rhythm, no JVD, rub or murmur. Abdomen.  Soft, nontender, nondistended, BS positive. CNS.  Lethargic, not following any commands Extremities.  No edema, no cyanosis, pulses intact and symmetrical.  Data Reviewed: Prior data reviewed  Family Communication: Discussed with daughter on phone  Disposition: Status is: Inpatient Remains inpatient appropriate because: Severity of illness  Planned Discharge Destination:  To be determined  Time spent: 50 minutes  This record has been created using Conservation officer, historic buildings. Errors have been sought and corrected,but may not always be located. Such creation errors do not reflect on the standard of care.   Author: Arnetha Courser, MD 04/25/2023 1:13 PM  For on call review [email or web address].

## 2023-04-26 ENCOUNTER — Ambulatory Visit: Payer: Medicare Other

## 2023-04-26 DIAGNOSIS — I10 Essential (primary) hypertension: Secondary | ICD-10-CM | POA: Diagnosis not present

## 2023-04-26 DIAGNOSIS — R4182 Altered mental status, unspecified: Secondary | ICD-10-CM

## 2023-04-26 DIAGNOSIS — I5A Non-ischemic myocardial injury (non-traumatic): Secondary | ICD-10-CM | POA: Diagnosis not present

## 2023-04-26 DIAGNOSIS — G9341 Metabolic encephalopathy: Secondary | ICD-10-CM | POA: Diagnosis not present

## 2023-04-26 DIAGNOSIS — F10931 Alcohol use, unspecified with withdrawal delirium: Secondary | ICD-10-CM | POA: Diagnosis not present

## 2023-04-26 LAB — CBC
HCT: 35 % — ABNORMAL LOW (ref 39.0–52.0)
Hemoglobin: 12.1 g/dL — ABNORMAL LOW (ref 13.0–17.0)
MCH: 32.9 pg (ref 26.0–34.0)
MCHC: 34.6 g/dL (ref 30.0–36.0)
MCV: 95.1 fL (ref 80.0–100.0)
Platelets: 155 10*3/uL (ref 150–400)
RBC: 3.68 MIL/uL — ABNORMAL LOW (ref 4.22–5.81)
RDW: 13 % (ref 11.5–15.5)
WBC: 7.3 10*3/uL (ref 4.0–10.5)
nRBC: 0 % (ref 0.0–0.2)

## 2023-04-26 LAB — GLUCOSE, CAPILLARY
Glucose-Capillary: 56 mg/dL — ABNORMAL LOW (ref 70–99)
Glucose-Capillary: 59 mg/dL — ABNORMAL LOW (ref 70–99)
Glucose-Capillary: 74 mg/dL (ref 70–99)
Glucose-Capillary: 89 mg/dL (ref 70–99)

## 2023-04-26 MED ORDER — BACLOFEN 10 MG PO TABS
10.0000 mg | ORAL_TABLET | Freq: Two times a day (BID) | ORAL | Status: DC
  Administered 2023-04-26 – 2023-05-07 (×23): 10 mg via ORAL
  Filled 2023-04-26 (×23): qty 1

## 2023-04-26 MED ORDER — PHENOBARBITAL 32.4 MG PO TABS
64.8000 mg | ORAL_TABLET | Freq: Three times a day (TID) | ORAL | Status: AC
  Administered 2023-04-26 – 2023-04-27 (×5): 64.8 mg via ORAL
  Filled 2023-04-26 (×5): qty 2

## 2023-04-26 MED ORDER — PHENOBARBITAL 32.4 MG PO TABS
32.4000 mg | ORAL_TABLET | Freq: Three times a day (TID) | ORAL | Status: AC
  Administered 2023-04-28 – 2023-04-29 (×5): 32.4 mg via ORAL
  Filled 2023-04-26 (×5): qty 1

## 2023-04-26 MED ORDER — DEXTROSE IN LACTATED RINGERS 5 % IV SOLN: INTRAVENOUS | Status: DC

## 2023-04-26 NOTE — Progress Notes (Signed)
Rounding Note    Patient Name: Steve Kelly Date of Encounter: 04/26/2023  [facility] Cardiologist: Marlyn Corporal Madireddy, MD   Subjective   Mental status improving. Patietn is a&O x2. He reports pain in his chest. Breathing stable.   Inpatient Medications    Scheduled Meds:  aspirin EC  81 mg Oral Daily   atenolol  50 mg Oral Daily   chlordiazePOXIDE  25 mg Oral TID   Chlorhexidine Gluconate Cloth  6 each Topical Nightly   vitamin B-12  1,000 mcg Oral Daily   enoxaparin (LOVENOX) injection  40 mg Subcutaneous Q24H   folic acid  1 mg Oral Daily   multivitamin with minerals  1 tablet Oral Daily   phenobarbital  64.8 mg Oral TID   Followed by   Melene Muller ON 04/28/2023] phenobarbital  32.4 mg Oral TID   thiamine  100 mg Oral Daily   Or   thiamine  100 mg Intravenous Daily   Continuous Infusions:  azithromycin 500 mg (04/26/23 1358)   cefTRIAXone (ROCEPHIN)  IV 2 g (04/26/23 1317)   PRN Meds: acetaminophen, hydrALAZINE, LORazepam **OR** LORazepam, LORazepam, ondansetron (ZOFRAN) IV   Vital Signs    Vitals:   04/26/23 1100 04/26/23 1114 04/26/23 1200 04/26/23 1300  BP: (!) 111/95  121/67 (!) 155/79  Pulse: 94  79 86  Resp: (!) 24  (!) 21 (!) 22  Temp:  98.5 F (36.9 C)    TempSrc:  Oral    SpO2: 97%  95% 98%  Weight:      Height:        Intake/Output Summary (Last 24 hours) at 04/26/2023 1402 Last data filed at 04/26/2023 1100 Gross per 24 hour  Intake 1052.04 ml  Output 900 ml  Net 152.04 ml      04/26/2023    6:46 AM 04/24/2023    7:00 AM 04/23/2023    9:28 PM  Last 3 Weights  Weight (lbs) 139 lb 5.3 oz 141 lb 1.5 oz 141 lb 1.5 oz  Weight (kg) 63.2 kg 64 kg 64 kg      Telemetry    NSR HR 80s - Personally Reviewed  ECG    No new - Personally Reviewed  Physical Exam   GEN: No acute distress.   Neck: No JVD Cardiac: RRR, + murmurs, rubs, or gallops.  Respiratory: course breath sounds. GI: Soft, nontender, non-distended  MS:  No edema; No deformity. Neuro:  Nonfocal  Psych: Normal affect   Labs    High Sensitivity Troponin:   Recent Labs  Lab 04/24/23 0037 04/24/23 0500 04/24/23 0832 04/24/23 1151 04/24/23 1509  TROPONINIHS 56* 118* 117* 95* 98*     Chemistry Recent Labs  Lab 04/23/23 1601 04/24/23 0500 04/25/23 0258  NA 135 140 138  K 3.5 3.6 3.5  CL 96* 106 106  CO2 27 25 24   GLUCOSE 186* 107* 81  BUN 9 12 12   CREATININE 1.20 1.09 0.89  CALCIUM 9.2 8.9 8.8*  PROT 8.6*  --   --   ALBUMIN 4.2  --   --   AST 29  --   --   ALT 11  --   --   ALKPHOS 85  --   --   BILITOT 1.0  --   --   GFRNONAA >60 >60 >60  ANIONGAP 12 9 8     Lipids  Recent Labs  Lab 04/23/23 1724  CHOL 187  TRIG 79  HDL 52  LDLCALC  119*  CHOLHDL 3.6    Hematology Recent Labs  Lab 04/24/23 0500 04/25/23 0258 04/26/23 0501  WBC 11.7* 7.5 7.3  RBC 3.81* 3.48* 3.68*  HGB 12.5* 11.3* 12.1*  HCT 36.1* 32.8* 35.0*  MCV 94.8 94.3 95.1  MCH 32.8 32.5 32.9  MCHC 34.6 34.5 34.6  RDW 13.5 13.3 13.0  PLT 184 162 155   Thyroid  Recent Labs  Lab 04/23/23 1724  TSH 0.975    BNP Recent Labs  Lab 04/24/23 0500  BNP 209.0*    DDimer No results for input(s): "DDIMER" in the last 168 hours.   Radiology    ECHOCARDIOGRAM COMPLETE Result Date: 04/25/2023    ECHOCARDIOGRAM REPORT   Patient Name:   MAKENZIE RANNOW Date of Exam: 04/25/2023 Medical Rec #:  [id number]        Height:       69.0 in Accession #:    [id number]       Weight:       141.1 lb Date of Birth:  1955-11-13        BSA:          1.781 m Patient Age:    67 years         BP:           142/92 mmHg Patient Gender: M                HR:           114 bpm. Exam Location:  [facility] Procedure: 2D Echo Indications:     Abnormal ECG R94.31  History:         Patient has no prior history of Echocardiogram examinations.  Sonographer:     Overton Mam RDCS, FASE Referring Phys:  [id number] AMIN Diagnosing Phys: Debbe Odea MD  Sonographer Comments:  Image acquisition challenging due to uncooperative patient. IMPRESSIONS  1. Left ventricular ejection fraction, by estimation, is 55 to 60%. The left ventricle has normal function. The left ventricle has no regional wall motion abnormalities. There is mild left ventricular hypertrophy. Left ventricular diastolic parameters are consistent with Grade I diastolic dysfunction (impaired relaxation).  2. Right ventricular systolic function is normal. The right ventricular size is normal.  3. The mitral valve is normal in structure. No evidence of mitral valve regurgitation.  4. Mean aortic valve gradient , peak gradient , Vmax 4.1 m/s, AVA 0.74cm2.. The aortic valve is calcified. Aortic valve regurgitation is not visualized. Severe aortic valve stenosis.  5. The inferior vena cava is normal in size with <50% respiratory variability, suggesting right atrial pressure of 8 mmHg. Conclusion(s)/Recommendation(s): Findings consistent with severe valvular heart disease. FINDINGS  Left Ventricle: Left ventricular ejection fraction, by estimation, is 55 to 60%. The left ventricle has normal function. The left ventricle has no regional wall motion abnormalities. The left ventricular internal cavity size was normal in size. There is  mild left ventricular hypertrophy. Left ventricular diastolic parameters are consistent with Grade I diastolic dysfunction (impaired relaxation). Right Ventricle: The right ventricular size is normal. No increase in right ventricular wall thickness. Right ventricular systolic function is normal. Left Atrium: Left atrial size was normal in size. Right Atrium: Right atrial size was normal in size. Pericardium: There is no evidence of pericardial effusion. Mitral Valve: The mitral valve is normal in structure. No evidence of mitral valve regurgitation. Tricuspid Valve: The tricuspid valve is normal in structure. Tricuspid valve regurgitation is mild. Aortic Valve: Mean aortic valve gradient  ,  peak gradient , Vmax 4.1 m/s, AVA 0.74cm2. The aortic valve is calcified. Aortic valve regurgitation is not visualized. Aortic regurgitation PHT measures 436 msec. Severe aortic stenosis is present. Aortic valve mean gradient measures 39.0 mmHg. Aortic valve peak gradient measures 64.5 mmHg. Aortic valve area, by VTI measures 0.75 cm. Pulmonic Valve: The pulmonic valve was normal in structure. Pulmonic valve regurgitation is not visualized. Aorta: The aortic root and ascending aorta are structurally normal, with no evidence of dilitation. Venous: The inferior vena cava is normal in size with less than 50% respiratory variability, suggesting right atrial pressure of 8 mmHg. IAS/Shunts: No atrial level shunt detected by color flow Doppler.  LEFT VENTRICLE PLAX 2D LVIDd:         3.80 cm   Diastology LVIDs:         2.40 cm   LV e' medial:    8.27 cm/s LV PW:         1.40 cm   LV E/e' medial:  9.1 LV IVS:        1.10 cm   LV e' lateral:   6.53 cm/s LVOT diam:     1.90 cm   LV E/e' lateral: 11.5 LV SV:         57 LV SV Index:   32 LVOT Area:     2.84 cm  RIGHT VENTRICLE RV Basal diam:  2.20 cm RV S prime:     19.30 cm/s TAPSE (M-mode): 1.8 cm LEFT ATRIUM           Index        RIGHT ATRIUM           Index LA diam:      2.80 cm 1.57 cm/m   RA Area:     10.40 cm LA Vol (A4C): 26.7 ml 14.99 ml/m  RA Volume:   17.70 ml  9.94 ml/m  AORTIC VALVE                     PULMONIC VALVE AV Area (Vmax):    0.70 cm      PV Vmax:       0.61 m/s AV Area (Vmean):   0.63 cm      PV Peak grad:  1.5 mmHg AV Area (VTI):     0.75 cm AV Vmax:           401.50 cm/s AV Vmean:          291.500 cm/s AV VTI:            0.755 m AV Peak Grad:      64.5 mmHg AV Mean Grad:      39.0 mmHg LVOT Vmax:         99.80 cm/s LVOT Vmean:        64.500 cm/s LVOT VTI:          0.201 m LVOT/AV VTI ratio: 0.27 AI PHT:            436 msec  AORTA Ao Root diam: 3.40 cm Ao Asc diam:  3.10 cm MITRAL VALVE               TRICUSPID VALVE MV Area (PHT):  4.99 cm    TV Peak grad:   28.0 mmHg MV Decel Time: 152 msec    TV Vmax:        2.64 m/s MV E velocity: 74.90 cm/s MV A velocity: 94.90 cm/s  SHUNTS MV E/A ratio:  0.79  Systemic VTI:  0.20 m                            Systemic Diam: 1.90 cm Debbe Odea MD Electronically signed by Debbe Odea MD Signature Date/Time: 04/25/2023/1:49:23 PM    Final    DG Chest Port 1 View Result Date: 04/25/2023 CLINICAL DATA:  Fever EXAM: PORTABLE CHEST - 1 VIEW COMPARISON:  04/23/2023 FINDINGS: Worsening airspace disease in the right mid and lower lung. Persistent left retrocardiac opacity. Heart size and mediastinal contours are within normal limits. Blunting right lateral costophrenic angle suggesting small effusion. Visualized bones unremarkable. IMPRESSION: Worsening right mid and lower lung airspace disease. Electronically Signed   By: Corlis Leak M.D.   On: 04/25/2023 11:54    Cardiac Studies   TTE 04/25/2023 1. Left ventricular ejection fraction, by estimation, is 55 to 60%. The  left ventricle has normal function. The left ventricle has no regional  wall motion abnormalities. There is mild left ventricular hypertrophy.  Left ventricular diastolic parameters  are consistent with Grade I diastolic dysfunction (impaired relaxation).   2. Right ventricular systolic function is normal. The right ventricular  size is normal.   3. The mitral valve is normal in structure. No evidence of mitral valve  regurgitation.   4. Mean aortic valve gradient , peak gradient , Vmax 4.1 m/s,  AVA 0.74cm2.. The aortic valve is calcified. Aortic valve regurgitation is  not visualized. Severe aortic valve stenosis.   5. The inferior vena cava is normal in size with <50% respiratory  variability, suggesting right atrial pressure of 8 mmHg.   Patient Profile     67 y.o. male with history of hypertension, EtOH abuse, systolic murmur presenting with altered mental status, diagnosed with  encephalopathy likely from alcohol withdrawal, being seen for severe aortic stenosis on echocardiogram and elevated troponins.   Assessment & Plan    Severe aortic stenosis - Echo showed LVEF 55-60% with mean aortic valve gradient , peak , severe AS - plan for R/L heart cath once AMS has improved, can possible be as OP - can see CTS as OP  Hypertension - atenolol 50mg  daily - BP elevated  Elevated troponins - HS trop [phone] - patient reports chest pain today, although in the past he denied chest pain - plan for R/L heart cath as above  Encephalopathy ETOH abuse - CIWA per IM  CAP - abx per IM  For questions or updates, please contact [facility] Please consult [email or web address] for contact info under        Signed, Caley Ciaramitaro David Stall, PA-C  04/26/2023, 2:02 PM

## 2023-04-26 NOTE — Progress Notes (Signed)
Eeg done 

## 2023-04-26 NOTE — Plan of Care (Signed)

## 2023-04-26 NOTE — TOC Initial Note (Signed)
Transition of Care [facility]) - Initial/Assessment Note    Patient Details  Name: Steve Kelly MRN: [id number] Date of Birth: July 30, 1955  [facility]) CM/SW Contact:    Steve Cline, LCSW Phone Number: 04/26/2023, 2:30 PM  Clinical Narrative:                 CSW called family per their request. Spoke with daughter in law Steve Kelly.  Steve Kelly stated patient was living with her and patient's son prior to admission. They are requesting housing resources as well as information for ALF/ILF. Resource lists sent to Steve Kelly.  Expected Discharge Plan: Home/Self Care     Patient Goals and CMS Choice   CMS Medicare.gov Compare Post Acute Care list provided to:: Patient Represenative (must comment) Choice offered to / list presented to : Adult Children      Expected Discharge Plan and Services       Living arrangements for the past 2 months: Single Family Home                                      Prior Living Arrangements/Services Living arrangements for the past 2 months: Single Family Home Lives with:: Adult Children                   Activities of Daily Living      Permission Sought/Granted                  Emotional Assessment              Admission diagnosis:  Metabolic encephalopathy [G93.41] Delirium tremens (HCC) [F10.931] Slurred speech [R47.81] Acute metabolic encephalopathy [G93.41] Patient Active Problem List   Diagnosis Date Noted   CAP (community acquired pneumonia) 04/25/2023   Aortic stenosis 04/25/2023   Elevated troponin 04/24/2023   Acute metabolic encephalopathy 04/23/2023   Delirium tremens (HCC) 04/23/2023   GERD (gastroesophageal reflux disease) 04/23/2023   Myocardial injury 04/23/2023   Leukocytosis 04/23/2023   Alcohol withdrawal (HCC) 03/14/2023   Acute alteration in mental status 03/14/2023   Generalized weakness 03/14/2023   History of alcohol use disorder 03/14/2023   Thrombocytopenia (HCC)  03/14/2023   Hypokalemia 03/14/2023   Alcohol abuse 10/09/2022   Heart murmur, systolic 10/09/2022   Hypertension 10/09/2022   Tremor 10/09/2022   Vitamin D deficiency 10/09/2022   PCP:  Ellis Parents, FNP Pharmacy:   [facility] [phone] - [place], [place] - [address] AT [address] [facility] [address] [place] [address] Phone: [phone] Fax: [phone]     Social Drivers of Health (SDOH) Social History: SDOH Screenings   Food Insecurity: Patient Unable To Answer (03/15/2023)  Housing: Patient Unable To Answer (03/15/2023)  Transportation Needs: Patient Unable To Answer (03/15/2023)  Utilities: Patient Unable To Answer (03/15/2023)  Tobacco Use: Low Risk  (04/23/2023)   SDOH Interventions:     Readmission Risk Interventions     No data to display

## 2023-04-26 NOTE — Procedures (Signed)
Patient Name: NECO VIVERITO  MRN: [id number]  Epilepsy Attending: Charlsie Quest  Referring Physician/Provider: Arnetha Courser, MD  Date: 04/26/2023 Duration: 28.04 mins  Patient history: 67yo M with ams getting eeg to evaluate for seizure  Level of alertness: Awake  AEDs during EEG study: Librium, Phenobarb  Technical aspects: This EEG study was done with scalp electrodes positioned according to the 10-20 International system of electrode placement. Electrical activity was reviewed with band pass filter of 1-70Hz , sensitivity of 7 uV/mm, display speed of 40mm/sec with a 60Hz  notched filter applied as appropriate. EEG data were recorded continuously and digitally stored.  Video monitoring was available and reviewed as appropriate.  Description: The posterior dominant rhythm consists of 8 Hz activity of moderate voltage (25-35 uV) seen predominantly in posterior head regions, symmetric and reactive to eye opening and eye closing. EEG showed intermittent generalized 3 to 6 Hz theta-delta slowing. Hyperventilation and photic stimulation were not performed.     ABNORMALITY - Intermittent slow, generalized  IMPRESSION: This study is suggestive of mild diffuse encephalopathy. No seizures or epileptiform discharges were seen throughout the recording.  Arilla Hice Annabelle Harman

## 2023-04-26 NOTE — Assessment & Plan Note (Signed)
Echocardiogram with concern of severe aortic stenosis. Patient need right and left cardiac catheterization and possible thoracic surgery evaluation per cardiology once improved from current encephalopathy.

## 2023-04-26 NOTE — Progress Notes (Signed)
Progress Note   Patient: Steve Kelly:[id number] DOB: June 10, 1955 DOA: 04/23/2023     3 DOS: the patient was seen and examined on 04/26/2023   Brief hospital course: Taken from H&P.  Steve Kelly is a 67 y.o. male with medical history significant of hypertension, GERD, alcohol abuse, DT, who presents with altered mental status.   Per report, pt was last known normal at about 2:00 p.m. Reportedly had seizure-like activity followed by unresponsiveness and aphasia. Family noticed a staring spell initially. Code stroke was called.  Stat CT head and MRI brain was negative for stroke.  CT cervical spine was also negative for any acute abnormality.  It was thought to be due to alcohol withdrawal with seizure and DTs, he was started on CIWA protocol.  He was also evaluated by PCCM but does not require an ICU admission at this time.  Patient had elevated blood pressure and tachycardia along with tachypnea on presentation.  Labs with WBC 10.4, UA was not consistent with UTI, troponin 44. UDS positive for cannabinoid.  Alcohol level less than 10.  12/14: Overall stable vitals with mild tachycardia, slowly rising troponin, currently at 118 after showing some improvement.  Starting on heparin infusion. Labs with B12 of 193, lipid panel with LDL of 119, ammonia of 30, TSH 0.975, A1c of 5.3, slight worsening of leukocytosis at 11.7 Cardiology was consulted and echo ordered.  12/15: Patient became febrile with maximum temperature recorded of 101.5 overnight.  Labs with resolution of prior leukocytosis, troponin peaked at 118, CIWA score of 14, echocardiogram done with pending results Repeat chest x-ray with worsening right mid to lower zone airspace disease, patient with worsening cough, starting on ceftriaxone and Zithromax for concern of pneumonia.  Ordered blood and sputum culture, strep pneumo and Legionella antigen, RVP and procalcitonin-which was elevated at 4.05. Repeat UA with few ketones  only.  12/16: Afebrile this morning with maximum temperature recorded of 102.3 over the past 24-hour.  Leukocytosis resolved, developed hypoglycemia. preliminary blood cultures negative, strep pneumo, RVP and MRSA swab negative.  Patient more alert and able to take p.o. so starting on diet. Echocardiogram with normal EF but found to have severe aortic stenosis which need further evaluation with right and left cardiac catheterization and thoracic surgery evaluation once mental status recovers.  Assessment and Plan: * Acute metabolic encephalopathy MRI of brain is negative for stroke. Consulted Dr. Wilford Corner of neuro. He suspects possible underlying alcohol use related encephalopathy versus alcohol withdrawal seizures.  Pt may have DT.  Slowly improving and becoming little more alert PCCM also evaluated him but he does not need ICU admission. B12 borderline low at 192-ordered supplement. TSH, RPR and ammonia levels within normal limit. -Continue with CIWA protocol and phenobarbital taper -Monitor volume status closely -EEG was also ordered   CAP (community acquired pneumonia) Remained febrile overnight, repeat chest x-ray with concern of right mid to lower lobe infiltrate.  Preliminary blood cultures negative, sputum cultures not done yet. Repeat UA with only few ketones. -Starting on ceftriaxone and Zithromax -Urinary strep negative and Legionella antigen pending -Supportive care  Delirium tremens [facility]) Patient has history of DTs.  Might had a alcohol withdrawal seizure as there was no prior history of seizures and MRI is negative. -Continue to monitor  Alcohol withdrawal (HCC) CIWA score of 7 today.  Becoming little more alert. -Continue with CIWA protocol with Ativan -Continue with phenobarbital -Continue to monitor  Hypertension Blood pressure mildly elevated -IV hydralazine as needed while  n.p.o. -Restart home atenolol  Myocardial injury Troponin peaked at 118, likely demand  ischemia.  Echocardiogram with normal EF but did show severe aortic stenosis Cardiology stopped heparin infusion -Continue with daily aspirin  Leukocytosis Leukocytosis improved today but patient became febrile, likely had pneumonia. -Continue to monitor  Aortic stenosis Echocardiogram with concern of severe aortic stenosis. Patient need right and left cardiac catheterization and possible thoracic surgery evaluation per cardiology once improved from current encephalopathy.   Subjective: Patient was somnolent when seen today, per nursing staff patient was quite alert and able to take food in the morning.  Physical Exam: Vitals:   04/26/23 1100 04/26/23 1114 04/26/23 1200 04/26/23 1300  BP: (!) 111/95  121/67 (!) 155/79  Pulse: 94  79 86  Resp: (!) 24  (!) 21 (!) 22  Temp:  98.5 F (36.9 C)    TempSrc:  Oral    SpO2: 97%  95% 98%  Weight:      Height:       General.  Chronically ill-appearing gentleman, in no acute distress. Pulmonary.  Lungs clear bilaterally, normal respiratory effort. CV.  Regular rate and rhythm, positive murmur. Abdomen.  Soft, nontender, nondistended, BS positive. CNS.  Somnolent.  No focal neurologic deficit. Extremities.  No edema, no cyanosis, pulses intact and symmetrical.  Data Reviewed: Prior data reviewed  Family Communication: Discussed with daughter on phone  Disposition: Status is: Inpatient Remains inpatient appropriate because: Severity of illness  Planned Discharge Destination:  To be determined  Time spent: 50 minutes  This record has been created using Conservation officer, historic buildings. Errors have been sought and corrected,but may not always be located. Such creation errors do not reflect on the standard of care.   Author: Arnetha Courser, MD 04/26/2023 1:21 PM  For on call review [email or web address].

## 2023-04-26 NOTE — Progress Notes (Signed)
0803: Glucose was 59- 4 oz of grape juice given.  1610: Glucose was 56  4oz of orange juice given.  9604:  Glucose was 74.  MD aware of glucose trends and that patient passed swallow eval this am and to order a diet.

## 2023-04-27 ENCOUNTER — Ambulatory Visit: Payer: Medicare Other

## 2023-04-27 DIAGNOSIS — I350 Nonrheumatic aortic (valve) stenosis: Secondary | ICD-10-CM | POA: Diagnosis not present

## 2023-04-27 DIAGNOSIS — G9341 Metabolic encephalopathy: Secondary | ICD-10-CM | POA: Diagnosis not present

## 2023-04-27 DIAGNOSIS — R7989 Other specified abnormal findings of blood chemistry: Secondary | ICD-10-CM | POA: Diagnosis not present

## 2023-04-27 LAB — BASIC METABOLIC PANEL
Anion gap: 9 (ref 5–15)
BUN: 7 mg/dL — ABNORMAL LOW (ref 8–23)
CO2: 24 mmol/L (ref 22–32)
Calcium: 8.8 mg/dL — ABNORMAL LOW (ref 8.9–10.3)
Chloride: 101 mmol/L (ref 98–111)
Creatinine, Ser: 0.66 mg/dL (ref 0.61–1.24)
GFR, Estimated: >60 mL/min (ref >60)
Glucose, Bld: 95 mg/dL (ref 70–99)
Potassium: 3.1 mmol/L — ABNORMAL LOW (ref 3.5–5.1)
Sodium: 134 mmol/L — ABNORMAL LOW (ref 135–145)

## 2023-04-27 LAB — CBC
HCT: 32.4 % — ABNORMAL LOW (ref 39.0–52.0)
Hemoglobin: 11.4 g/dL — ABNORMAL LOW (ref 13.0–17.0)
MCH: 32.9 pg (ref 26.0–34.0)
MCHC: 35.2 g/dL (ref 30.0–36.0)
MCV: 93.4 fL (ref 80.0–100.0)
Platelets: 174 10*3/uL (ref 150–400)
RBC: 3.47 MIL/uL — ABNORMAL LOW (ref 4.22–5.81)
RDW: 12.6 % (ref 11.5–15.5)
WBC: 6.4 10*3/uL (ref 4.0–10.5)
nRBC: 0 % (ref 0.0–0.2)

## 2023-04-27 LAB — MAGNESIUM: Magnesium: 1.7 mg/dL (ref 1.7–2.4)

## 2023-04-27 LAB — GLUCOSE, CAPILLARY: Glucose-Capillary: 81 mg/dL (ref 70–99)

## 2023-04-27 LAB — LEGIONELLA PNEUMOPHILA SEROGP 1 UR AG: L. pneumophila Serogp 1 Ur Ag: NEGATIVE

## 2023-04-27 LAB — PHOSPHORUS: Phosphorus: 3.8 mg/dL (ref 2.5–4.6)

## 2023-04-27 MED ORDER — CARVEDILOL 6.25 MG PO TABS
6.2500 mg | ORAL_TABLET | Freq: Two times a day (BID) | ORAL | Status: DC
  Administered 2023-04-28 – 2023-05-07 (×19): 6.25 mg via ORAL
  Filled 2023-04-27 (×21): qty 1

## 2023-04-27 MED ORDER — ACETAMINOPHEN 325 MG PO TABS
650.0000 mg | ORAL_TABLET | Freq: Once | ORAL | Status: AC
  Administered 2023-04-27: 650 mg via ORAL
  Filled 2023-04-27: qty 2

## 2023-04-27 MED ORDER — MAGNESIUM SULFATE 2 GM/50ML IV SOLN
2.0000 g | Freq: Once | INTRAVENOUS | Status: AC
Start: 2023-04-27 — End: 2023-04-27
  Administered 2023-04-27: 2 g via INTRAVENOUS
  Filled 2023-04-27: qty 50

## 2023-04-27 MED ORDER — POTASSIUM CHLORIDE CRYS ER 20 MEQ PO TBCR
40.0000 meq | EXTENDED_RELEASE_TABLET | ORAL | Status: AC
Start: 2023-04-27 — End: 2023-04-27
  Administered 2023-04-27 (×2): 40 meq via ORAL
  Filled 2023-04-27 (×2): qty 2

## 2023-04-27 MED ORDER — CHLORDIAZEPOXIDE HCL 5 MG PO CAPS
10.0000 mg | ORAL_CAPSULE | Freq: Three times a day (TID) | ORAL | Status: DC
  Administered 2023-04-27 – 2023-04-30 (×9): 10 mg via ORAL
  Filled 2023-04-27 (×9): qty 2

## 2023-04-27 NOTE — Progress Notes (Signed)
Rounding Note    Patient Name: Steve Kelly Date of Encounter: 04/27/2023  [facility] Cardiologist: Marlyn Corporal Madireddy, MD   Subjective   Mental status still not back to baseline, difficult to understand response during interview. No chest pain reported.  Inpatient Medications    Scheduled Meds:  aspirin EC  81 mg Oral Daily   atenolol  50 mg Oral Daily   baclofen  10 mg Oral BID   chlordiazePOXIDE  10 mg Oral TID   Chlorhexidine Gluconate Cloth  6 each Topical Nightly   vitamin B-12  1,000 mcg Oral Daily   enoxaparin (LOVENOX) injection  40 mg Subcutaneous Q24H   folic acid  1 mg Oral Daily   multivitamin with minerals  1 tablet Oral Daily   phenobarbital  64.8 mg Oral TID   Followed by   Melene Muller ON 04/28/2023] phenobarbital  32.4 mg Oral TID   potassium chloride  40 mEq Oral Q4H   thiamine  100 mg Oral Daily   Or   thiamine  100 mg Intravenous Daily   Continuous Infusions:  azithromycin Stopped (04/26/23 1458)   cefTRIAXone (ROCEPHIN)  IV Stopped (04/26/23 1347)   magnesium sulfate bolus IVPB     PRN Meds: acetaminophen, hydrALAZINE, LORazepam, ondansetron (ZOFRAN) IV   Vital Signs    Vitals:   04/27/23 1000 04/27/23 1050 04/27/23 1100 04/27/23 1200  BP: (!) 185/160 (!) 144/79 133/84 125/78  Pulse: 98 86 88 74  Resp: 18 19 20 16   Temp:      TempSrc:      SpO2: 96% 97% 100% 96%  Weight:      Height:        Intake/Output Summary (Last 24 hours) at 04/27/2023 1249 Last data filed at 04/27/2023 0600 Gross per 24 hour  Intake 890 ml  Output 1175 ml  Net -285 ml      04/26/2023    6:46 AM 04/24/2023    7:00 AM 04/23/2023    9:28 PM  Last 3 Weights  Weight (lbs) 139 lb 5.3 oz 141 lb 1.5 oz 141 lb 1.5 oz  Weight (kg) 63.2 kg 64 kg 64 kg      Telemetry    NSR HR 80s- Personally Reviewed  ECG    No new - Personally Reviewed  Physical Exam   GEN: No acute distress.   Neck: No JVD Cardiac: RRR, no murmurs, rubs, or gallops.   Respiratory: Clear to auscultation bilaterally. GI: Soft, nontender, non-distended  MS: No edema; No deformity. Neuro:  Nonfocal  Psych: Normal affect   Labs    High Sensitivity Troponin:   Recent Labs  Lab 04/24/23 0037 04/24/23 0500 04/24/23 0832 04/24/23 1151 04/24/23 1509  TROPONINIHS 56* 118* 117* 95* 98*     Chemistry Recent Labs  Lab 04/23/23 1601 04/24/23 0500 04/25/23 0258 04/27/23 0340  NA 135 140 138 134*  K 3.5 3.6 3.5 3.1*  CL 96* 106 106 101  CO2 27 25 24 24   GLUCOSE 186* 107* 81 95  BUN 9 12 12  7*  CREATININE 1.20 1.09 0.89 0.66  CALCIUM 9.2 8.9 8.8* 8.8*  MG  --   --   --  1.7  PROT 8.6*  --   --   --   ALBUMIN 4.2  --   --   --   AST 29  --   --   --   ALT 11  --   --   --  ALKPHOS 85  --   --   --   BILITOT 1.0  --   --   --   GFRNONAA >60 >60 >60 >60  ANIONGAP 12 9 8 9     Lipids  Recent Labs  Lab 04/23/23 1724  CHOL 187  TRIG 79  HDL 52  LDLCALC 119*  CHOLHDL 3.6    Hematology Recent Labs  Lab 04/25/23 0258 04/26/23 0501 04/27/23 0340  WBC 7.5 7.3 6.4  RBC 3.48* 3.68* 3.47*  HGB 11.3* 12.1* 11.4*  HCT 32.8* 35.0* 32.4*  MCV 94.3 95.1 93.4  MCH 32.5 32.9 32.9  MCHC 34.5 34.6 35.2  RDW 13.3 13.0 12.6  PLT 162 155 174   Thyroid  Recent Labs  Lab 04/23/23 1724  TSH 0.975    BNP Recent Labs  Lab 04/24/23 0500  BNP 209.0*    DDimer No results for input(s): "DDIMER" in the last 168 hours.   Radiology    EEG adult Result Date: 04/26/2023 Charlsie Quest, MD     04/26/2023  5:13 PM Patient Name: Steve Kelly MRN: [id number] Epilepsy Attending: Charlsie Quest Referring Physician/Provider: Arnetha Courser, MD Date: 04/26/2023 Duration: 28.04 mins Patient history: 67yo M with ams getting eeg to evaluate for seizure Level of alertness: Awake AEDs during EEG study: Librium, Phenobarb Technical aspects: This EEG study was done with scalp electrodes positioned according to the 10-20 International system of electrode  placement. Electrical activity was reviewed with band pass filter of 1-70Hz , sensitivity of 7 uV/mm, display speed of 57mm/sec with a 60Hz  notched filter applied as appropriate. EEG data were recorded continuously and digitally stored.  Video monitoring was available and reviewed as appropriate. Description: The posterior dominant rhythm consists of 8 Hz activity of moderate voltage (25-35 uV) seen predominantly in posterior head regions, symmetric and reactive to eye opening and eye closing. EEG showed intermittent generalized 3 to 6 Hz theta-delta slowing. Hyperventilation and photic stimulation were not performed.   ABNORMALITY - Intermittent slow, generalized IMPRESSION: This study is suggestive of mild diffuse encephalopathy. No seizures or epileptiform discharges were seen throughout the recording. Charlsie Quest    Cardiac Studies   TTE 04/25/2023 1. Left ventricular ejection fraction, by estimation, is 55 to 60%. The  left ventricle has normal function. The left ventricle has no regional  wall motion abnormalities. There is mild left ventricular hypertrophy.  Left ventricular diastolic parameters  are consistent with Grade I diastolic dysfunction (impaired relaxation).   2. Right ventricular systolic function is normal. The right ventricular  size is normal.   3. The mitral valve is normal in structure. No evidence of mitral valve  regurgitation.   4. Mean aortic valve gradient , peak gradient , Vmax 4.1 m/s,  AVA 0.74cm2.. The aortic valve is calcified. Aortic valve regurgitation is  not visualized. Severe aortic valve stenosis.   5. The inferior vena cava is normal in size with <50% respiratory  variability, suggesting right atrial pressure of 8 mmHg.     Patient Profile     67 y.o. male with history of hypertension, EtOH abuse, systolic murmur presenting with altered mental status, diagnosed with encephalopathy likely from alcohol withdrawal, being seen for severe  aortic stenosis on echocardiogram and elevated troponins   Assessment & Plan    Severe aortic stenosis - Echo showed LVEF 55-60% with mean aortic valve gradient , peak , severe AS - plan for R/L heart cath once AMS has improved, can possibly be as OP -  can see CTS as OP   Hypertension - atenolol 50mg  daily - BP elevated at times   Elevated troponins - HS trop [phone] - patient reports chest pain today, although in the past he denied chest pain - plan for R/L heart cath as above   Encephalopathy ETOH abuse - CIWA per IM   CAP - abx per IM    For questions or updates, please contact [facility] Please consult [email or web address] for contact info under        Signed, Bradan Congrove David Stall, PA-C  04/27/2023, 12:49 PM

## 2023-04-27 NOTE — Progress Notes (Addendum)
Progress Note    Steve Kelly  QMV:[id number] DOB: 08-Jan-1956  DOA: 04/23/2023 PCP: Ellis Parents, FNP      Brief Narrative:    Medical records reviewed and are as summarized below:  Steve Kelly is a 67 y.o. male with medical history significant of hypertension, GERD, alcohol abuse, DT, who presents with altered mental status.    Per report, pt was last known normal at about 2:00 p.m. Reportedly had seizure-like activity followed by unresponsiveness and aphasia. Family noticed a staring spell initially. Code stroke was called.  Stat CT head and MRI brain were negative for stroke.  CT cervical spine was also negative for any acute abnormality.  It was thought to be due to alcohol withdrawal with seizure and Dts. He was started on CIWA protocol.  He was also evaluated by PCCM but he was deemed not to require ICU admission at that time. He was tachycardic, hypertensive and tachypneic on presentation.  UDS was positive for cannabinoids and alcohol level was less than 10.   12/14: Overall stable vitals with mild tachycardia, slowly rising troponin, currently at 118 after showing some improvement.  Starting on heparin infusion. Labs with B12 of 193, lipid panel with LDL of 119, ammonia of 30, TSH 0.975, A1c of 5.3, slight worsening of leukocytosis at 11.7 Cardiology was consulted and echo ordered.  12/15: Patient became febrile with maximum temperature recorded of 101.5 overnight.  Labs with resolution of prior leukocytosis, troponin peaked at 118, CIWA score of 14, echocardiogram done with pending results Repeat chest x-ray with worsening right mid to lower zone airspace disease, patient with worsening cough, starting on ceftriaxone and Zithromax for concern of pneumonia.  Ordered blood and sputum culture, strep pneumo and Legionella antigen, RVP and procalcitonin-which was elevated at 4.05. Repeat UA with few ketones only.  12/16: Afebrile this morning with maximum  temperature recorded of 102.3 over the past 24-hour.  Leukocytosis resolved, developed hypoglycemia. preliminary blood cultures negative, strep pneumo, RVP and MRSA swab negative.  Patient more alert and able to take p.o. so starting on diet. Echocardiogram with normal EF but found to have severe aortic stenosis which need further evaluation with right and left cardiac catheterization and thoracic surgery evaluation once mental status recovers.      Assessment/Plan:   Principal Problem:   Acute metabolic encephalopathy Active Problems:   CAP (community acquired pneumonia)   Alcohol withdrawal (HCC)   Delirium tremens (HCC)   Hypertension   Myocardial injury   Leukocytosis   Aortic stenosis   Elevated troponin    Body mass index is 20.57 kg/m.   Acute metabolic encephalopathy, ? delirium tremens, suspected underlying dementia: CT and MRI brain negative for acute stroke.  He was evaluated by the neurologist.  Alcohol-related encephalopathy versus alcohol withdrawal seizures in the differential diagnosis.   Alcohol use disorder with alcohol withdrawal: Continue benzodiazepines per CIWA protocol, thiamine and multivitamin Decrease Librium from 25 mg 3 times daily to 10 mg 3 times daily.  Continue phenobarbital taper.   Community-acquired pneumonia: Continue empiric IV ceftriaxone and azithromycin   Elevated troponins: Likely due to demand ischemia.  He was initially treated with IV heparin drip but this was discontinued because no evidence of ACS.   Severe aortic stenosis: 2D echo showed EF 55 to 60% and severe aortic stenosis.  Plan for right and left heart cath when mental status improves.   Hypokalemia: Replete potassium and monitor levels   Recent hypoglycemia: 04/26/2023:  Glucose levels have improved   Low normal vitamin B12 (193): Continue vitamin B12 supplement   Leukocytosis: Resolved   Diet Order             Diet Heart Room service appropriate? Yes;  Fluid consistency: Thin  Diet effective now                            Consultants: Cardiologist Urologist  Procedures: None    Medications:    aspirin EC  81 mg Oral Daily   atenolol  50 mg Oral Daily   baclofen  10 mg Oral BID   chlordiazePOXIDE  25 mg Oral TID   Chlorhexidine Gluconate Cloth  6 each Topical Nightly   vitamin B-12  1,000 mcg Oral Daily   enoxaparin (LOVENOX) injection  40 mg Subcutaneous Q24H   folic acid  1 mg Oral Daily   multivitamin with minerals  1 tablet Oral Daily   phenobarbital  64.8 mg Oral TID   Followed by   Melene Muller ON 04/28/2023] phenobarbital  32.4 mg Oral TID   potassium chloride  40 mEq Oral Q4H   thiamine  100 mg Oral Daily   Or   thiamine  100 mg Intravenous Daily   Continuous Infusions:  azithromycin Stopped (04/26/23 1458)   cefTRIAXone (ROCEPHIN)  IV Stopped (04/26/23 1347)     Anti-infectives (From admission, onward)    Start     Dose/Rate Route Frequency Ordered Stop   04/25/23 1400  azithromycin (ZITHROMAX) 500 mg in sodium chloride 0.9 % 250 mL IVPB        500 mg 250 mL/hr over 60 Minutes Intravenous Every 24 hours 04/25/23 1216     04/25/23 1330  cefTRIAXone (ROCEPHIN) 2 g in sodium chloride 0.9 % 100 mL IVPB        2 g 200 mL/hr over 30 Minutes Intravenous Every 24 hours 04/25/23 1216                Family Communication/Anticipated D/C date and plan/Code Status   DVT prophylaxis: enoxaparin (LOVENOX) injection 40 mg Start: 04/25/23 1000     Code Status: Full Code  Family Communication: None Disposition Plan: Plan to discharge home   Status is: Inpatient Remains inpatient appropriate because: Encephalopathy       Subjective:   Interval events noted.  He complains of soreness everywhere.  He is confused and cannot provide an adequate history.    Objective:    Vitals:   04/27/23 1000 04/27/23 1050 04/27/23 1100 04/27/23 1200  BP: (!) 185/160 (!) 144/79 133/84 125/78  Pulse:  98 86 88 74  Resp: 18 19 20 16   Temp:      TempSrc:      SpO2: 96% 97% 100% 96%  Weight:      Height:       No data found.   Intake/Output Summary (Last 24 hours) at 04/27/2023 1207 Last data filed at 04/27/2023 0600 Gross per 24 hour  Intake 890 ml  Output 1175 ml  Net -285 ml   Filed Weights   04/23/23 2128 04/24/23 0700 04/26/23 0646  Weight: 64 kg 64 kg 63.2 kg    Exam:  GEN: NAD SKIN: Warm and dry EYES: No pallor or icterus ENT: MMM CV: RRR PULM: CTA B ABD: soft, ND, NT, +BS CNS: AAO x 1 (person), non focal EXT: No edema or tenderness        Data Reviewed:  I have personally reviewed following labs and imaging studies:  Labs: Labs show the following:   Basic Metabolic Panel: Recent Labs  Lab 04/23/23 1601 04/24/23 0500 04/25/23 0258 04/27/23 0340  NA 135 140 138 134*  K 3.5 3.6 3.5 3.1*  CL 96* 106 106 101  CO2 27 25 24 24   GLUCOSE 186* 107* 81 95  BUN 9 12 12  7*  CREATININE 1.20 1.09 0.89 0.66  CALCIUM 9.2 8.9 8.8* 8.8*  MG  --   --   --  1.7  PHOS  --   --   --  3.8   GFR Estimated Creatinine Clearance: 82.3 mL/min (by C-G formula based on SCr of 0.66 mg/dL). Liver Function Tests: Recent Labs  Lab 04/23/23 1601  AST 29  ALT 11  ALKPHOS 85  BILITOT 1.0  PROT 8.6*  ALBUMIN 4.2   No results for input(s): "LIPASE", "AMYLASE" in the last 168 hours. Recent Labs  Lab 04/23/23 1724  AMMONIA 30   Coagulation profile Recent Labs  Lab 04/23/23 1601 04/24/23 0832  INR 1.1 1.3*    CBC: Recent Labs  Lab 04/23/23 1601 04/24/23 0500 04/25/23 0258 04/26/23 0501 04/27/23 0340  WBC 10.6* 11.7* 7.5 7.3 6.4  NEUTROABS 6.0  --   --   --   --   HGB 14.8 12.5* 11.3* 12.1* 11.4*  HCT 43.9 36.1* 32.8* 35.0* 32.4*  MCV 97.6 94.8 94.3 95.1 93.4  PLT 211 184 162 155 174   Cardiac Enzymes: No results for input(s): "CKTOTAL", "CKMB", "CKMBINDEX", "TROPONINI" in the last 168 hours. BNP (last 3 results) No results for input(s):  "PROBNP" in the last 8760 hours. CBG: Recent Labs  Lab 04/26/23 0803 04/26/23 0828 04/26/23 0856 04/26/23 1749 04/27/23 0734  GLUCAP 59* 56* 74 89 81   D-Dimer: No results for input(s): "DDIMER" in the last 72 hours. Hgb A1c: No results for input(s): "HGBA1C" in the last 72 hours. Lipid Profile: No results for input(s): "CHOL", "HDL", "LDLCALC", "TRIG", "CHOLHDL", "LDLDIRECT" in the last 72 hours. Thyroid function studies: No results for input(s): "TSH", "T4TOTAL", "T3FREE", "THYROIDAB" in the last 72 hours.  Invalid input(s): "FREET3" Anemia work up: No results for input(s): "VITAMINB12", "FOLATE", "FERRITIN", "TIBC", "IRON", "RETICCTPCT" in the last 72 hours. Sepsis Labs: Recent Labs  Lab 04/24/23 0500 04/25/23 0258 04/26/23 0501 04/27/23 0340  PROCALCITON  --  4.05  --   --   WBC 11.7* 7.5 7.3 6.4    Microbiology Recent Results (from the past 240 hours)  MRSA Next Gen by PCR, Nasal     Status: None   Collection Time: 04/25/23  9:39 AM   Specimen: Nasal Mucosa; Nasal Swab  Result Value Ref Range Status   MRSA by PCR Next Gen NOT DETECTED NOT DETECTED Final    Comment: (NOTE) The GeneXpert MRSA Assay (FDA approved for NASAL specimens only), is one component of a comprehensive MRSA colonization surveillance program. It is not intended to diagnose MRSA infection nor to guide or monitor treatment for MRSA infections. Test performance is not FDA approved in patients less than 65 years old. Performed at [facility], [address] [place], [place] [address]   Respiratory (~20 pathogens) panel by PCR     Status: None   Collection Time: 04/25/23 12:40 PM   Specimen: Nasopharyngeal Swab; Respiratory  Result Value Ref Range Status   Adenovirus NOT DETECTED NOT DETECTED Final   Coronavirus 229E NOT DETECTED NOT DETECTED Final    Comment: (NOTE) The  Coronavirus on the Respiratory Panel, DOES NOT test for the novel  Coronavirus (2019 nCoV)    Coronavirus  HKU1 NOT DETECTED NOT DETECTED Final   Coronavirus NL63 NOT DETECTED NOT DETECTED Final   Coronavirus OC43 NOT DETECTED NOT DETECTED Final   Metapneumovirus NOT DETECTED NOT DETECTED Final   Rhinovirus / Enterovirus NOT DETECTED NOT DETECTED Final   Influenza A NOT DETECTED NOT DETECTED Final   Influenza B NOT DETECTED NOT DETECTED Final   Parainfluenza Virus 1 NOT DETECTED NOT DETECTED Final   Parainfluenza Virus 2 NOT DETECTED NOT DETECTED Final   Parainfluenza Virus 3 NOT DETECTED NOT DETECTED Final   Parainfluenza Virus 4 NOT DETECTED NOT DETECTED Final   Respiratory Syncytial Virus NOT DETECTED NOT DETECTED Final   Bordetella pertussis NOT DETECTED NOT DETECTED Final   Bordetella Parapertussis NOT DETECTED NOT DETECTED Final   Chlamydophila pneumoniae NOT DETECTED NOT DETECTED Final   Mycoplasma pneumoniae NOT DETECTED NOT DETECTED Final    Comment: Performed at [facility], [address] [place], [place] [address]  Culture, blood (Routine X 2) w Reflex to ID Panel     Status: None (Preliminary result)   Collection Time: 04/25/23  1:31 PM   Specimen: BLOOD LEFT HAND  Result Value Ref Range Status   Specimen Description BLOOD LEFT HAND  Final   Special Requests   Final    BOTTLES DRAWN AEROBIC ONLY Blood Culture results may not be optimal due to an inadequate volume of blood received in culture bottles   Culture   Final    NO GROWTH 2 DAYS Performed at [facility], [address], [place], [place] [address]    Report Status PENDING  Incomplete  Culture, blood (Routine X 2) w Reflex to ID Panel     Status: None (Preliminary result)   Collection Time: 04/25/23  1:31 PM   Specimen: BLOOD RIGHT HAND  Result Value Ref Range Status   Specimen Description BLOOD RIGHT HAND  Final   Special Requests   Final    BOTTLES DRAWN AEROBIC AND ANAEROBIC Blood Culture adequate volume   Culture   Final    NO GROWTH 2 DAYS Performed at [facility], [address], [place], [place] [address]    Report Status PENDING  Incomplete    Procedures and diagnostic studies:  EEG adult Result Date: May 06, 2023 Charlsie Quest, MD     05/06/2023  5:13 PM Patient Name: Steve Kelly MRN: [id number] Epilepsy Attending: Charlsie Quest Referring Physician/Provider: Arnetha Courser, MD Date: May 06, 2023 Duration: 28.04 mins Patient history: 67yo M with ams getting eeg to evaluate for seizure Level of alertness: Awake AEDs during EEG study: Librium, Phenobarb Technical aspects: This EEG study was done with scalp electrodes positioned according to the 10-20 International system of electrode placement. Electrical activity was reviewed with band pass filter of 1-70Hz , sensitivity of 7 uV/mm, display speed of 57mm/sec with a 60Hz  notched filter applied as appropriate. EEG data were recorded continuously and digitally stored.  Video monitoring was available and reviewed as appropriate. Description: The posterior dominant rhythm consists of 8 Hz activity of moderate voltage (25-35 uV) seen predominantly in posterior head regions, symmetric and reactive to eye opening and eye closing. EEG showed intermittent generalized 3 to 6 Hz theta-delta slowing. Hyperventilation and photic stimulation were not performed.   ABNORMALITY - Intermittent slow, generalized IMPRESSION: This study is suggestive of mild diffuse encephalopathy. No seizures or epileptiform discharges were seen throughout  the recording. Steve Kelly               LOS: 4 days   Steve Kelly  [facility]   Pager on [email or web address]. If 7PM-7AM, please contact night-coverage at [email or web address]     04/27/2023, 12:07 PM

## 2023-04-27 NOTE — Plan of Care (Signed)

## 2023-04-27 NOTE — Progress Notes (Signed)
PHARMACY CONSULT NOTE - FOLLOW UP  Pharmacy Consult for Electrolyte Monitoring and Replacement   Recent Labs: Potassium (mmol/L)  Date Value  04/27/2023 3.1 (L)   Magnesium (mg/dL)  Date Value  03/47/4259 2.0   Calcium (mg/dL)  Date Value  56/38/7564 8.8 (L)   Albumin (g/dL)  Date Value  33/29/5188 4.2   Phosphorus (mg/dL)  Date Value  41/66/0630 3.2   Sodium (mmol/L)  Date Value  04/27/2023 134 (L)   Assessment: 67 y.o. male with medical history significant of hypertension, GERD, alcohol abuse, DT, who presented with altered mental status.   Goal of Therapy:  Electrolytes WNL  Plan:  ---40 mEq po KCl x 2 ---recheck electrolytes in am  Lowella Bandy ,PharmD Clinical Pharmacist 04/27/2023 7:10 AM

## 2023-04-28 DIAGNOSIS — G9341 Metabolic encephalopathy: Secondary | ICD-10-CM | POA: Diagnosis not present

## 2023-04-28 DIAGNOSIS — R4781 Slurred speech: Secondary | ICD-10-CM

## 2023-04-28 DIAGNOSIS — F10931 Alcohol use, unspecified with withdrawal delirium: Secondary | ICD-10-CM | POA: Diagnosis not present

## 2023-04-28 DIAGNOSIS — R7989 Other specified abnormal findings of blood chemistry: Secondary | ICD-10-CM | POA: Diagnosis not present

## 2023-04-28 HISTORY — DX: Slurred speech: R47.81

## 2023-04-28 LAB — MAGNESIUM: Magnesium: 2.1 mg/dL (ref 1.7–2.4)

## 2023-04-28 LAB — BASIC METABOLIC PANEL
Anion gap: 10 (ref 5–15)
BUN: 6 mg/dL — ABNORMAL LOW (ref 8–23)
CO2: 25 mmol/L (ref 22–32)
Calcium: 9.1 mg/dL (ref 8.9–10.3)
Chloride: 100 mmol/L (ref 98–111)
Creatinine, Ser: 0.71 mg/dL (ref 0.61–1.24)
GFR, Estimated: >60 mL/min (ref >60)
Glucose, Bld: 94 mg/dL (ref 70–99)
Potassium: 3.5 mmol/L (ref 3.5–5.1)
Sodium: 135 mmol/L (ref 135–145)

## 2023-04-28 LAB — GLUCOSE, CAPILLARY
Glucose-Capillary: 61 mg/dL — ABNORMAL LOW (ref 70–99)
Glucose-Capillary: 67 mg/dL — ABNORMAL LOW (ref 70–99)

## 2023-04-28 MED ORDER — ACETAMINOPHEN 325 MG PO TABS
650.0000 mg | ORAL_TABLET | Freq: Four times a day (QID) | ORAL | Status: DC | PRN
  Administered 2023-04-28 – 2023-05-04 (×8): 650 mg via ORAL
  Filled 2023-04-28 (×8): qty 2

## 2023-04-28 MED ORDER — DEXTROSE 50 % IV SOLN: 12.5000 g | INTRAVENOUS | Status: AC

## 2023-04-28 MED ORDER — DEXTROSE 50 % IV SOLN
INTRAVENOUS | Status: AC
  Administered 2023-04-28: 12.5 g via INTRAVENOUS
  Filled 2023-04-28: qty 50

## 2023-04-28 NOTE — Progress Notes (Signed)
Rounding Note    Patient Name: Steve Kelly Date of Encounter: 04/28/2023  [facility] Cardiologist: Marlyn Corporal Madireddy, MD   Subjective   Patient is seen on AM rounds. Alert and speaking in complete sentences, mentation seems to be improved from yesterday.   Inpatient Medications    Scheduled Meds:  aspirin EC  81 mg Oral Daily   baclofen  10 mg Oral BID   carvedilol  6.25 mg Oral BID WC   chlordiazePOXIDE  10 mg Oral TID   Chlorhexidine Gluconate Cloth  6 each Topical Nightly   vitamin B-12  1,000 mcg Oral Daily   enoxaparin (LOVENOX) injection  40 mg Subcutaneous Q24H   folic acid  1 mg Oral Daily   multivitamin with minerals  1 tablet Oral Daily   phenobarbital  32.4 mg Oral TID   thiamine  100 mg Oral Daily   Or   thiamine  100 mg Intravenous Daily   Continuous Infusions:  azithromycin Stopped (04/27/23 1456)   cefTRIAXone (ROCEPHIN)  IV Stopped (04/27/23 1350)   PRN Meds: acetaminophen, hydrALAZINE, LORazepam, ondansetron (ZOFRAN) IV   Vital Signs    Vitals:   04/28/23 0700 04/28/23 0800 04/28/23 0900 04/28/23 0938  BP: 123/75 (!) 156/102  130/89  Pulse: 84 90 85 83  Resp: 13 20 18    Temp:  98.4 F (36.9 C)    TempSrc:      SpO2: 93% 90% 95%   Weight:      Height:        Intake/Output Summary (Last 24 hours) at 04/28/2023 1003 Last data filed at 04/28/2023 0500 Gross per 24 hour  Intake 520 ml  Output 950 ml  Net -430 ml      04/26/2023    6:46 AM 04/24/2023    7:00 AM 04/23/2023    9:28 PM  Last 3 Weights  Weight (lbs) 139 lb 5.3 oz 141 lb 1.5 oz 141 lb 1.5 oz  Weight (kg) 63.2 kg 64 kg 64 kg      Telemetry    Sinus rhythm with occasional PVCs rate 70-80 bpm - Personally Reviewed  Physical Exam   GEN: No acute distress.   Neck: No JVD Cardiac: RRR, 3/6 systolic murmur.  Respiratory: Clear to auscultation bilaterally. GI: Soft, nontender, non-distended  MS: No edema; No deformity. Neuro:  Nonfocal  Psych:  Normal affect   Labs    High Sensitivity Troponin:   Recent Labs  Lab 04/24/23 0037 04/24/23 0500 04/24/23 0832 04/24/23 1151 04/24/23 1509  TROPONINIHS 56* 118* 117* 95* 98*     Chemistry Recent Labs  Lab 04/23/23 1601 04/24/23 0500 04/25/23 0258 04/27/23 0340 04/28/23 0333  NA 135   < > 138 134* 135  K 3.5   < > 3.5 3.1* 3.5  CL 96*   < > 106 101 100  CO2 27   < > 24 24 25   GLUCOSE 186*   < > 81 95 94  BUN 9   < > 12 7* 6*  CREATININE 1.20   < > 0.89 0.66 0.71  CALCIUM 9.2   < > 8.8* 8.8* 9.1  MG  --   --   --  1.7 2.1  PROT 8.6*  --   --   --   --   ALBUMIN 4.2  --   --   --   --   AST 29  --   --   --   --  ALT 11  --   --   --   --   ALKPHOS 85  --   --   --   --   BILITOT 1.0  --   --   --   --   GFRNONAA >60   < > >60 >60 >60  ANIONGAP 12   < > 8 9 10    < > = values in this interval not displayed.    Lipids  Recent Labs  Lab 04/23/23 1724  CHOL 187  TRIG 79  HDL 52  LDLCALC 119*  CHOLHDL 3.6    Hematology Recent Labs  Lab 04/25/23 0258 04/26/23 0501 04/27/23 0340  WBC 7.5 7.3 6.4  RBC 3.48* 3.68* 3.47*  HGB 11.3* 12.1* 11.4*  HCT 32.8* 35.0* 32.4*  MCV 94.3 95.1 93.4  MCH 32.5 32.9 32.9  MCHC 34.5 34.6 35.2  RDW 13.3 13.0 12.6  PLT 162 155 174   Thyroid  Recent Labs  Lab 04/23/23 1724  TSH 0.975    BNP Recent Labs  Lab 04/24/23 0500  BNP 209.0*    DDimer No results for input(s): "DDIMER" in the last 168 hours.   Radiology    EEG adult Result Date: 04/26/2023 Charlsie Quest, MD     04/26/2023  5:13 PM Patient Name: Steve Kelly MRN: [id number] Epilepsy Attending: Charlsie Quest Referring Physician/Provider: Arnetha Courser, MD Date: 04/26/2023 Duration: 28.04 mins Patient history: 67yo M with ams getting eeg to evaluate for seizure Level of alertness: Awake AEDs during EEG study: Librium, Phenobarb Technical aspects: This EEG study was done with scalp electrodes positioned according to the 10-20 International system of  electrode placement. Electrical activity was reviewed with band pass filter of 1-70Hz , sensitivity of 7 uV/mm, display speed of 69mm/sec with a 60Hz  notched filter applied as appropriate. EEG data were recorded continuously and digitally stored.  Video monitoring was available and reviewed as appropriate. Description: The posterior dominant rhythm consists of 8 Hz activity of moderate voltage (25-35 uV) seen predominantly in posterior head regions, symmetric and reactive to eye opening and eye closing. EEG showed intermittent generalized 3 to 6 Hz theta-delta slowing. Hyperventilation and photic stimulation were not performed.   ABNORMALITY - Intermittent slow, generalized IMPRESSION: This study is suggestive of mild diffuse encephalopathy. No seizures or epileptiform discharges were seen throughout the recording. Charlsie Quest    Cardiac Studies   TTE 04/25/2023 1. Left ventricular ejection fraction, by estimation, is 55 to 60%. The  left ventricle has normal function. The left ventricle has no regional  wall motion abnormalities. There is mild left ventricular hypertrophy.  Left ventricular diastolic parameters  are consistent with Grade I diastolic dysfunction (impaired relaxation).   2. Right ventricular systolic function is normal. The right ventricular  size is normal.   3. The mitral valve is normal in structure. No evidence of mitral valve  regurgitation.   4. Mean aortic valve gradient , peak gradient , Vmax 4.1 m/s,  AVA 0.74cm2.. The aortic valve is calcified. Aortic valve regurgitation is  not visualized. Severe aortic valve stenosis.   5. The inferior vena cava is normal in size with <50% respiratory  variability, suggesting right atrial pressure of 8 mmHg.   Patient Profile     67 y.o. male with history of hypertension, EtOH abuse, systolic murmur presenting with altered mental status, diagnosed with encephalopathy likely from alcohol withdrawal, being seen for  severe aortic stenosis on echocardiogram and elevated troponins   Assessment &  Plan    Severe aortic stenosis - Echo showed LVEF 55-60% with mean aortic valve gradient , peak , severe AS - Can consider follow up with CT surgery as OP, however likely not a good candidate for invasive management with ETOH abuse, chronic incontinence, etc - Can consider cath in the future if mental status improves, continue conservative management  Hypertension - BP mostly stable, somewhat elevated at times with systolic 140s - Continue atenolol 50 mg daily - Continue to monitor  Elevated troponins - Troponin 118>117>95>98  - Can consider cath in the future if mental status improves  Encephalopathy ETOH abuse - CIWA per IM  CAP - Management per IM  For questions or updates, please contact [facility] Please consult [email or web address] for contact info under        Signed, Orion Crook, PA-C  04/28/2023, 10:03 AM

## 2023-04-28 NOTE — Progress Notes (Signed)
Patient arrived to unit at 1540 in bed.  Patient oriented to room and unit and use of call light to maintain patient safety and needs met.  Patient alert and oriented x4.  CIWA scale 2 due to BUE tremors, right greater than left.

## 2023-04-28 NOTE — Evaluation (Signed)
Physical Therapy Evaluation Patient Details Name: Steve Kelly MRN: [id number] DOB: [birth year] Today's Date: 04/28/2023  History of Present Illness  67 y.o. male who presents to the ED with altered mental status. +alcohol withdrawal; AKI; ?seizure 11/03; head CT no acute changes;   PMH significant of alcohol use disorder, HTN and vitamin D deficiency  Clinical Impression  Patient received sitting edge of bed with OT present in room. Patient is not oriented to place or situation. He is pleasant and works well with PT despite decreased safety awareness and poor balance. Patient required mod +2 for sit to stand x 3 reps. Posterior leaning with standing and difficulty attempting to march in place. Difficulty attempting to side step with RW. He will continue to benefit from skilled PT to improve safety, and functional independence.          If plan is discharge home, recommend the following: A lot of help with walking and/or transfers;A lot of help with bathing/dressing/bathroom;Assist for transportation;Help with stairs or ramp for entrance   Can travel by private vehicle   No    Equipment Recommendations Rolling walker (2 wheels)  Recommendations for Other Services       Functional Status Assessment Patient has had a recent decline in their functional status and demonstrates the ability to make significant improvements in function in a reasonable and predictable amount of time.     Precautions / Restrictions Precautions Precautions: Fall Restrictions Weight Bearing Restrictions Per Provider Order: No      Mobility  Bed Mobility Overal bed mobility: Needs Assistance Bed Mobility: Supine to Sit, Sit to Supine     Supine to sit: Mod assist Sit to supine: Supervision        Transfers Overall transfer level: Needs assistance Equipment used: Rolling walker (2 wheels) Transfers: Sit to/from Stand Sit to Stand: Mod assist, +2 physical assistance           General  transfer comment: poor standing balance. Posterior leaning. Requiring assist to hold walker down. Feet sliding forward. Patient able to march in place and side step a little ( maybe 1 step)    Ambulation/Gait                  Stairs            Wheelchair Mobility     Tilt Bed    Modified Rankin (Stroke Patients Only)       Balance Overall balance assessment: Needs assistance, History of Falls Sitting-balance support: Feet supported Sitting balance-Leahy Scale: Fair   Postural control: Posterior lean Standing balance support: Bilateral upper extremity supported, During functional activity, Reliant on assistive device for balance Standing balance-Leahy Scale: Poor Standing balance comment: requires assistance due to posterior lean.                             Pertinent Vitals/Pain Pain Assessment Pain Assessment: No/denies pain    Home Living Family/patient expects to be discharged to:: Private residence Living Arrangements: Children Available Help at Discharge: Family;Available PRN/intermittently Type of Home: House Home Access: Stairs to enter Entrance Stairs-Rails: None Entrance Stairs-Number of Steps: 2   Home Layout: One level Home Equipment: None Additional Comments: Pt lives at home with son who is there all day, daughter in law works during the day    Prior Function Prior Level of Function : Independent/Modified Independent             Mobility  Comments: says he has a RW that is old.       Extremity/Trunk Assessment   Upper Extremity Assessment Upper Extremity Assessment: Defer to OT evaluation    Lower Extremity Assessment Lower Extremity Assessment: Generalized weakness    Cervical / Trunk Assessment Cervical / Trunk Assessment: Normal  Communication   Communication Communication: Difficulty communicating thoughts/reduced clarity of speech;Difficulty following commands/understanding Following commands: Follows one  step commands inconsistently;Follows one step commands with increased time Cueing Techniques: Verbal cues;Gestural cues;Tactile cues  Cognition Arousal: Alert Behavior During Therapy: Impulsive Overall Cognitive Status: Impaired/Different from baseline Area of Impairment: Orientation, Safety/judgement, Awareness, Problem solving                 Orientation Level: Disoriented to, Place, Situation, Time       Safety/Judgement: Decreased awareness of deficits, Decreased awareness of safety Awareness: Intellectual Problem Solving: Slow processing, Decreased initiation, Difficulty sequencing, Requires verbal cues          General Comments      Exercises     Assessment/Plan    PT Assessment Patient needs continued PT services  PT Problem List Decreased strength;Decreased range of motion;Decreased activity tolerance;Decreased balance;Decreased coordination;Decreased cognition;Decreased knowledge of use of DME;Decreased mobility;Decreased safety awareness;Decreased knowledge of precautions       PT Treatment Interventions DME instruction;Gait training;Stair training;Functional mobility training;Therapeutic activities;Therapeutic exercise;Balance training;Neuromuscular re-education;Cognitive remediation;Patient/family education    PT Goals (Current goals can be found in the Care Plan section)  Acute Rehab PT Goals Patient Stated Goal: to return to son's home PT Goal Formulation: With patient Time For Goal Achievement: 05/11/23 Potential to Achieve Goals: Fair    Frequency Min 1X/week     Co-evaluation PT/OT/SLP Co-Evaluation/Treatment: Yes Reason for Co-Treatment: For patient/therapist safety;Necessary to address cognition/behavior during functional activity;To address functional/ADL transfers PT goals addressed during session: Mobility/safety with mobility;Balance;Proper use of DME OT goals addressed during session: ADL's and self-care       AM-PAC PT "6 Clicks"  Mobility  Outcome Measure Help needed turning from your back to your side while in a flat bed without using bedrails?: A Little Help needed moving from lying on your back to sitting on the side of a flat bed without using bedrails?: A Little Help needed moving to and from a bed to a chair (including a wheelchair)?: A Lot Help needed standing up from a chair using your arms (e.g., wheelchair or bedside chair)?: A Lot Help needed to walk in hospital room?: Total Help needed climbing 3-5 steps with a railing? : Total 6 Click Score: 12    End of Session Equipment Utilized During Treatment: Oxygen Activity Tolerance: Patient tolerated treatment well Patient left: in bed;with call bell/phone within reach;with bed alarm set Nurse Communication: Mobility status PT Visit Diagnosis: Other abnormalities of gait and mobility (R26.89);Unsteadiness on feet (R26.81);Muscle weakness (generalized) (M62.81);Difficulty in walking, not elsewhere classified (R26.2);History of falling (Z91.81)    Time: 1420-1431 PT Time Calculation (min) (ACUTE ONLY): 11 min   Charges:   PT Evaluation $PT Eval Moderate Complexity: 1 Mod   PT General Charges $$ ACUTE PT VISIT: 1 Visit         Khali Albanese, PT, GCS 04/28/23,2:50 PM

## 2023-04-28 NOTE — Progress Notes (Signed)
Progress Note    Steve Kelly  ZOX:[id number] DOB: [birth year]  DOA: 04/23/2023 PCP: Ellis Parents, FNP      Brief Narrative:    Medical records reviewed and are as summarized below:  Steve Kelly is a 67 y.o. male with medical history significant of hypertension, GERD, alcohol use disorder, fatty liver, delirium tremens, who presented to the hospital with altered mental status.    Per report, pt was last known normal at about 2:00 p.m. Reportedly had seizure-like activity followed by unresponsiveness and aphasia. Family noticed a staring spell initially. Code stroke was called.  Stat CT head and MRI brain were negative for stroke.  CT cervical spine was also negative for any acute abnormality.  It was thought to be due to alcohol withdrawal with seizure and Dts. He was started on CIWA protocol.  He was also evaluated by PCCM but he was deemed not to require ICU admission at that time. He was tachycardic, hypertensive and tachypneic on presentation.  UDS was positive for cannabinoids and alcohol level was less than 10.   12/14: Overall stable vitals with mild tachycardia, slowly rising troponin, currently at 118 after showing some improvement.  Starting on heparin infusion. Labs with B12 of 193, lipid panel with LDL of 119, ammonia of 30, TSH 0.975, A1c of 5.3, slight worsening of leukocytosis at 11.7 Cardiology was consulted and echo ordered.  12/15: Patient became febrile with maximum temperature recorded of 101.5 overnight.  Labs with resolution of prior leukocytosis, troponin peaked at 118, CIWA score of 14, echocardiogram done with pending results Repeat chest x-ray with worsening right mid to lower zone airspace disease, patient with worsening cough, starting on ceftriaxone and Zithromax for concern of pneumonia.  Ordered blood and sputum culture, strep pneumo and Legionella antigen, RVP and procalcitonin-which was elevated at 4.05. Repeat UA with few ketones  only.  12/16: Afebrile this morning with maximum temperature recorded of 102.3 over the past 24-hour.  Leukocytosis resolved, developed hypoglycemia. preliminary blood cultures negative, strep pneumo, RVP and MRSA swab negative.  Patient more alert and able to take p.o. so starting on diet. Echocardiogram with normal EF but found to have severe aortic stenosis which need further evaluation with right and left cardiac catheterization and thoracic surgery evaluation once mental status recovers.      Assessment/Plan:   Principal Problem:   Metabolic encephalopathy Active Problems:   CAP (community acquired pneumonia)   Alcohol withdrawal (HCC)   Delirium tremens (HCC)   Hypertension   Myocardial injury   Leukocytosis   Aortic stenosis   Elevated troponin   Slurred speech    Body mass index is 20.57 kg/m.   Acute metabolic encephalopathy, ? delirium tremens, suspected underlying dementia: CT and MRI brain negative for acute stroke.  He was evaluated by the neurologist.  Alcohol-related encephalopathy versus alcohol withdrawal seizures in the differential diagnosis.   Alcohol use disorder with alcohol withdrawal: Continue benzodiazepines per CIWA protocol, thiamine and multivitamin Continue Librium and phenobarbital taper.     Community-acquired pneumonia: Plan to complete 5-day history of IV ceftriaxone and azithromycin tomorrow.    Elevated troponins: Likely due to demand ischemia.  He was initially treated with IV heparin drip but this was discontinued because no evidence of ACS.   Severe aortic stenosis: 2D echo showed EF 55 to 60% and severe aortic stenosis.  Plan for right and left heart cath when mental status improves.   Hypokalemia: Improved.  Continue potassium repletion.  Recurrent hypoglycemia: Glucose level dropped to 67 and 61 this morning.  He is not on any hypoglycemic agent.  Encourage adequate oral intake.   Low normal vitamin B12 (193): Continue  vitamin B12 supplement   Leukocytosis: Resolved   Transfer from stepdown unit to MedSurg unit.   Plan discussed with Myra, RN, at the bedside    Diet Order             DIET DYS 3 Room service appropriate? Yes with Assist; Fluid consistency: Thin  Diet effective now                            Consultants: Cardiologist Urologist  Procedures: None    Medications:    aspirin EC  81 mg Oral Daily   baclofen  10 mg Oral BID   carvedilol  6.25 mg Oral BID WC   chlordiazePOXIDE  10 mg Oral TID   vitamin B-12  1,000 mcg Oral Daily   enoxaparin (LOVENOX) injection  40 mg Subcutaneous Q24H   folic acid  1 mg Oral Daily   multivitamin with minerals  1 tablet Oral Daily   phenobarbital  32.4 mg Oral TID   thiamine  100 mg Oral Daily   Or   thiamine  100 mg Intravenous Daily   Continuous Infusions:  azithromycin 500 mg (04/28/23 1345)   cefTRIAXone (ROCEPHIN)  IV Stopped (04/27/23 1350)     Anti-infectives (From admission, onward)    Start     Dose/Rate Route Frequency Ordered Stop   04/25/23 1400  azithromycin (ZITHROMAX) 500 mg in sodium chloride 0.9 % 250 mL IVPB        500 mg 250 mL/hr over 60 Minutes Intravenous Every 24 hours 04/25/23 1216     04/25/23 1330  cefTRIAXone (ROCEPHIN) 2 g in sodium chloride 0.9 % 100 mL IVPB        2 g 200 mL/hr over 30 Minutes Intravenous Every 24 hours 04/25/23 1216                Family Communication/Anticipated D/C date and plan/Code Status   DVT prophylaxis: enoxaparin (LOVENOX) injection 40 mg Start: 04/25/23 1000     Code Status: Full Code  Family Communication: None Disposition Plan: Plan to discharge home   Status is: Inpatient Remains inpatient appropriate because: Encephalopathy       Subjective:   Interval events noted.  He is still confused and cannot provide any history.  Myra, RN, was at the bedside  Objective:    Vitals:   04/28/23 1100 04/28/23 1200 04/28/23 1435  04/28/23 1542  BP: 121/64 120/71  100/71  Pulse: 90 90  82  Resp: 18 18  17   Temp:  98.5 F (36.9 C)  99 F (37.2 C)  TempSrc:      SpO2: 95% 94% 91% 96%  Weight:      Height:       No data found.   Intake/Output Summary (Last 24 hours) at 04/28/2023 1651 Last data filed at 04/28/2023 0500 Gross per 24 hour  Intake 129.15 ml  Output 700 ml  Net -570.85 ml   Filed Weights   04/23/23 2128 04/24/23 0700 04/26/23 0646  Weight: 64 kg 64 kg 63.2 kg    Exam:  GEN: NAD SKIN: Warm and dry EYES: No pallor or icterus ENT: MMM CV: RRR, systolic murmur loudest in the left upper sternal area PULM: CTA B ABD: soft, ND, NT, +  BS CNS: AAO x 1, non focal EXT: No edema or tenderness      Data Reviewed:   I have personally reviewed following labs and imaging studies:  Labs: Labs show the following:   Basic Metabolic Panel: Recent Labs  Lab 04/23/23 1601 04/24/23 0500 04/25/23 0258 04/27/23 0340 04/28/23 0333  NA 135 140 138 134* 135  K 3.5 3.6 3.5 3.1* 3.5  CL 96* 106 106 101 100  CO2 27 25 24 24 25   GLUCOSE 186* 107* 81 95 94  BUN 9 12 12  7* 6*  CREATININE 1.20 1.09 0.89 0.66 0.71  CALCIUM 9.2 8.9 8.8* 8.8* 9.1  MG  --   --   --  1.7 2.1  PHOS  --   --   --  3.8  --    GFR Estimated Creatinine Clearance: 82.3 mL/min (by C-G formula based on SCr of 0.71 mg/dL). Liver Function Tests: Recent Labs  Lab 04/23/23 1601  AST 29  ALT 11  ALKPHOS 85  BILITOT 1.0  PROT 8.6*  ALBUMIN 4.2   No results for input(s): "LIPASE", "AMYLASE" in the last 168 hours. Recent Labs  Lab 04/23/23 1724  AMMONIA 30   Coagulation profile Recent Labs  Lab 04/23/23 1601 04/24/23 0832  INR 1.1 1.3*    CBC: Recent Labs  Lab 04/23/23 1601 04/24/23 0500 04/25/23 0258 04/26/23 0501 04/27/23 0340  WBC 10.6* 11.7* 7.5 7.3 6.4  NEUTROABS 6.0  --   --   --   --   HGB 14.8 12.5* 11.3* 12.1* 11.4*  HCT 43.9 36.1* 32.8* 35.0* 32.4*  MCV 97.6 94.8 94.3 95.1 93.4  PLT 211  184 162 155 174   Cardiac Enzymes: No results for input(s): "CKTOTAL", "CKMB", "CKMBINDEX", "TROPONINI" in the last 168 hours. BNP (last 3 results) No results for input(s): "PROBNP" in the last 8760 hours. CBG: Recent Labs  Lab 04/26/23 0856 04/26/23 1749 04/27/23 0734 04/28/23 0739 04/28/23 0741  GLUCAP 74 89 81 67* 61*   D-Dimer: No results for input(s): "DDIMER" in the last 72 hours. Hgb A1c: No results for input(s): "HGBA1C" in the last 72 hours. Lipid Profile: No results for input(s): "CHOL", "HDL", "LDLCALC", "TRIG", "CHOLHDL", "LDLDIRECT" in the last 72 hours. Thyroid function studies: No results for input(s): "TSH", "T4TOTAL", "T3FREE", "THYROIDAB" in the last 72 hours.  Invalid input(s): "FREET3" Anemia work up: No results for input(s): "VITAMINB12", "FOLATE", "FERRITIN", "TIBC", "IRON", "RETICCTPCT" in the last 72 hours. Sepsis Labs: Recent Labs  Lab 04/24/23 0500 04/25/23 0258 04/26/23 0501 04/27/23 0340  PROCALCITON  --  4.05  --   --   WBC 11.7* 7.5 7.3 6.4    Microbiology Recent Results (from the past 240 hours)  MRSA Next Gen by PCR, Nasal     Status: None   Collection Time: 04/25/23  9:39 AM   Specimen: Nasal Mucosa; Nasal Swab  Result Value Ref Range Status   MRSA by PCR Next Gen NOT DETECTED NOT DETECTED Final    Comment: (NOTE) The GeneXpert MRSA Assay (FDA approved for NASAL specimens only), is one component of a comprehensive MRSA colonization surveillance program. It is not intended to diagnose MRSA infection nor to guide or monitor treatment for MRSA infections. Test performance is not FDA approved in patients less than 68 years old. Performed at [facility], [address] [place], [place] [address]   Respiratory (~20 pathogens) panel by PCR     Status: None   Collection Time: 04/25/23 12:40 PM  Specimen: Nasopharyngeal Swab; Respiratory  Result Value Ref Range Status   Adenovirus NOT DETECTED NOT DETECTED Final    Coronavirus 229E NOT DETECTED NOT DETECTED Final    Comment: (NOTE) The Coronavirus on the Respiratory Panel, DOES NOT test for the novel  Coronavirus (2019 nCoV)    Coronavirus HKU1 NOT DETECTED NOT DETECTED Final   Coronavirus NL63 NOT DETECTED NOT DETECTED Final   Coronavirus OC43 NOT DETECTED NOT DETECTED Final   Metapneumovirus NOT DETECTED NOT DETECTED Final   Rhinovirus / Enterovirus NOT DETECTED NOT DETECTED Final   Influenza A NOT DETECTED NOT DETECTED Final   Influenza B NOT DETECTED NOT DETECTED Final   Parainfluenza Virus 1 NOT DETECTED NOT DETECTED Final   Parainfluenza Virus 2 NOT DETECTED NOT DETECTED Final   Parainfluenza Virus 3 NOT DETECTED NOT DETECTED Final   Parainfluenza Virus 4 NOT DETECTED NOT DETECTED Final   Respiratory Syncytial Virus NOT DETECTED NOT DETECTED Final   Bordetella pertussis NOT DETECTED NOT DETECTED Final   Bordetella Parapertussis NOT DETECTED NOT DETECTED Final   Chlamydophila pneumoniae NOT DETECTED NOT DETECTED Final   Mycoplasma pneumoniae NOT DETECTED NOT DETECTED Final    Comment: Performed at [facility], [address], [place], [place] [address]  Culture, blood (Routine X 2) w Reflex to ID Panel     Status: None (Preliminary result)   Collection Time: 04/25/23  1:31 PM   Specimen: BLOOD LEFT HAND  Result Value Ref Range Status   Specimen Description BLOOD LEFT HAND  Final   Special Requests   Final    BOTTLES DRAWN AEROBIC ONLY Blood Culture results may not be optimal due to an inadequate volume of blood received in culture bottles   Culture   Final    NO GROWTH 3 DAYS Performed at [facility], [address] [place], [place] [address]    Report Status PENDING  Incomplete  Culture, blood (Routine X 2) w Reflex to ID Panel     Status: None (Preliminary result)   Collection Time: 04/25/23  1:31 PM   Specimen: BLOOD RIGHT HAND  Result Value Ref Range Status   Specimen Description BLOOD RIGHT HAND  Final    Special Requests   Final    BOTTLES DRAWN AEROBIC AND ANAEROBIC Blood Culture adequate volume   Culture   Final    NO GROWTH 3 DAYS Performed at [facility], [address], [place], [place] [address]    Report Status PENDING  Incomplete    Procedures and diagnostic studies:  No results found.              LOS: 5 days   Vannah Nadal  [facility]   Pager on [email or web address]. If 7PM-7AM, please contact night-coverage at [email or web address]     04/28/2023, 4:51 PM

## 2023-04-28 NOTE — Progress Notes (Addendum)
0800 Patient awake and alert. Oriented to self and month. Cannot answer difficult questions. Ignores nurse when he doesn't want to answer questions. Coughs with thin liquids. Speech garbled. Speech eval. requested order from MD. Moves all extremities when ask to. Tends to favor his left arm but nothing physical noted to be wrong.1250 Attempted to call report  to [facility] RN on 1C- nurse in another patient's room.1309 Report called to [employer] on 1C. Son informed of move to 114. 1415 Transferred via bed to room 114.

## 2023-04-28 NOTE — Progress Notes (Signed)
PHARMACY CONSULT NOTE - FOLLOW UP  Pharmacy Consult for Electrolyte Monitoring and Replacement   Recent Labs: Potassium (mmol/L)  Date Value  04/28/2023 3.5   Magnesium (mg/dL)  Date Value  69/62/9528 2.1   Calcium (mg/dL)  Date Value  41/32/4401 9.1   Albumin (g/dL)  Date Value  02/72/5366 4.2   Phosphorus (mg/dL)  Date Value  44/07/4740 3.8   Sodium (mmol/L)  Date Value  04/28/2023 135   Assessment: 67 y.o. male with medical history significant of hypertension, GERD, alcohol abuse, DT, who presented with altered mental status.   Goal of Therapy:  Electrolytes WNL  Plan:  Because this consult was generated as part of an ICU order set and patient is transferring pharmacy will sign off for now Please feel free to reach out if any further assistance is needed  Steve Kelly ,PharmD Clinical Pharmacist 04/28/2023 7:11 AM

## 2023-04-28 NOTE — Evaluation (Signed)
Clinical/Bedside Swallow Evaluation Patient Details  Name: Steve Kelly MRN: [id number] Date of Birth: 1956/04/12  Today's Date: 04/28/2023 Time: SLP Start Time (ACUTE ONLY): 1200 SLP Stop Time (ACUTE ONLY): 1255 SLP Time Calculation (min) (ACUTE ONLY): 55 min  Past Medical History:  Past Medical History:  Diagnosis Date   Alcohol abuse    Heart murmur on physical examination    Hypertension    Tremor    Vitamin D deficiency    Past Surgical History: History reviewed. No pertinent surgical history. HPI:  Pt is a 67 y.o. male who presents to the ED with altered mental status. +Alcohol withdrawal; AKI; ?seizure 11/03; head CT no acute changes;   PMH significant of alcohol use disorder, HTN and vitamin D deficiency.  Pt lives at home.    MRI: No acute intracranial abnormality.  2. Moderately Advanced cerebral atrophy with chronic microvascular ischemic disease. Few small remote cerebellar infarcts.   CXR at admit: negative but on 12/15(2 days later): airspace disease in the right mid and lower lung.  Persistent left retrocardiac opacity.  OF NOTE: Family stated pt had a moderate VOMITING episode at time prior to admit -- pt could have likely aspirated Vomit material thus leading to pneumonia.    Assessment / Plan / Recommendation  Clinical Impression   Pt seen for BSE today. Pt awake, verbal given Time. Oriented to self, hospital, Family present. Blinking eyes often and seemed min distracted. Noted pt is being monitored for alcohol withdrawal d/t ETOH abuse/use disorder. Family x2 present. Pt followed commands w/ cues and Time.  On [place] O2 2L; afebrile. WBC WNL.  Pt appears to present w/ grossly functional oropharyngeal phase swallow w/ No overt oropharyngeal phase dysphagia noted, No neuromuscular deficits noted. Pt is Edentulous at Baseline and requires min more time for mashing/gumming foods during oral phase -- this also requires min more attention.  Pt consumed po trials w/ No overt,  clinical s/s of aspiration during po trials except 1x when taking a larger sip via straw(not repeated w/ careful monitoring).  Pt appears at reduced risk for aspiration following general aspiration precautions. However, pt does have challenging factors that could impact oropharyngeal swallowing to include ETOH use/abuse Disorder and Withdrawal issues, Cognitive decline, deconditioning/weakness, and dependency for feeding/careful monitoring when drinking(pinched straw). These factors can increase risk for aspiration, dysphagia as well as decreased oral intake overall.   During po trials, pt consumed all consistencies w/ no overt coughing(except 1x w/ larger sip), decline in vocal quality, or change in respiratory presentation during/post trials. O2 sats remained 95-96%. Oral phase appeared grossly [facility] w/ timely bolus management, mashing/gumming of softened foods cut small, and control of bolus propulsion for A-P transfer for swallowing. Oral clearing achieved w/ all trial consistencies -- moistened, soft foods given. OM Exam appeared [facility] w/ no unilateral weakness noted. Speech Clear. Pt fed self by holding Cup to drink -- needed support w/ utensil feeding.   Recommend a more Mech Soft consistency diet w/ well-Cut meats, moistened foods(same as prior admit last month); Thin liquids -- carefully monitor straw use(PINCH to limit large sips), and pt should help to Hold Cup when drinking. Recommend general aspiration precautions, reduce distractions/talking during meals, SMALL sips/bites SLOWLY to allow time for oral clearing b/t. Support pt's self-feeding. 100 Supervision w/ all oral intake. MUST sit fully upright for oral intake. Pills WHOLE vs Crushed in Puree for safer, easier swallowing -- per NSG.  Education given on Pills in Puree; food consistencies and easy  to eat options; general aspiration precautions and support/Supervision needed to pt and Family present. No further skilled ST services expected. MD/NSG  to reconsult if any new needs arise during admit. NSG updated, agreed. MD updated. Recommend Dietician f/u for support; counseling for ETOH Disorder. SLP Visit Diagnosis: Dysphagia, unspecified (R13.10) (Edentulous at baseline; ETOH Dis.; overall weakness for ADLs and self-feeding)    Aspiration Risk  Mild aspiration risk;Risk for inadequate nutrition/hydration (reduced when following general aspiration precautions)    Diet Recommendation   Thin;Dysphagia 3 (mechanical soft) = a more Mech Soft consistency diet w/ well-Cut meats, moistened foods(same as prior admit last month); Thin liquids -- carefully monitor straw use(PINCH to limit large sips), and pt should help to Hold Cup when drinking. Recommend general aspiration precautions, reduce distractions/talking during meals, SMALL sips/bites SLOWLY to allow time for oral clearing b/t. Support pt's self-feeding. 100 Supervision w/ all oral intake. MUST sit fully upright for oral intake.   Medication Administration: Whole meds with puree    Other  Recommendations Recommended Consults:  (Dietician) Oral Care Recommendations: Oral care BID;Staff/trained caregiver to provide oral care    Recommendations for follow up therapy are one component of a multi-disciplinary discharge planning process, led by the attending physician.  Recommendations may be updated based on patient status, additional functional criteria and insurance authorization.  Follow up Recommendations No SLP follow up      Assistance Recommended at Discharge  FULL d/t cognition and impulsivity  Functional Status Assessment Patient has had a recent decline in their functional status and demonstrates the ability to make significant improvements in function in a reasonable and predictable amount of time.  Frequency and Duration  (n/a)   (n/a)       Prognosis Prognosis for improved oropharyngeal function: Fair (-Good) Barriers to Reach Goals: Cognitive deficits;Time post  onset;Severity of deficits;Behavior Barriers/Prognosis Comment: ETOH use/abuse Dis., Edentulous; Cognitive decline; need for support w/ ADLs/feeding      Swallow Study   General Date of Onset: 04/23/23 HPI: Pt is a 67 y.o. male who presents to the ED with altered mental status. +Alcohol withdrawal; AKI; ?seizure 11/03; head CT no acute changes;   PMH significant of alcohol use disorder, HTN and vitamin D deficiency.  Pt lives at home.   MRI: No acute intracranial abnormality.  2. Moderately advanced cerebral atrophy with chronic microvascular ischemic disease. Few small remote cerebellar infarcts.   CXR at admit: negative but on 12/15(2 days later): airspace disease in the right mid and lower lung.  Persistent left retrocardiac opacity.  OF NOTE: Family stated pt had a moderate VOMITING episode at time prior to admit -- pt could have likely aspirated Vomit material thus leading to pneumonia. Type of Study: Bedside Swallow Evaluation Previous Swallow Assessment: 03/2023 - noted similar cognition and swallowing abilities then Diet Prior to this Study: Dysphagia 3 (mechanical soft);Thin liquids (Level 0) Temperature Spikes Noted: No (wbc 6.4) Respiratory Status: Nasal cannula (2L) History of Recent Intubation: No Behavior/Cognition: Alert;Cooperative;Pleasant mood;Confused;Distractible;Requires cueing Oral Cavity Assessment: Within Functional Limits Oral Care Completed by SLP: Yes Oral Cavity - Dentition: Edentulous Vision: Functional for self-feeding Self-Feeding Abilities: Able to feed self;Needs assist;Needs set up;Total assist (weak UEs; Cognition) Patient Positioning: Upright in bed (needed full support) Baseline Vocal Quality: Low vocal intensity (functional) Volitional Cough: Strong Volitional Swallow: Able to elicit    Oral/Motor/Sensory Function Overall Oral Motor/Sensory Function: Within functional limits   Ice Chips Ice chips: Within functional limits Presentation: Spoon (fed; 2  trials)  Thin Liquid Thin Liquid: Within functional limits Presentation: Cup;Self Fed;Straw (pinched straw -- 10 trials via each method) Other Comments: impulsive    Nectar Thick Nectar Thick Liquid: Not tested   Honey Thick Honey Thick Liquid: Not tested   Puree Puree: Within functional limits Presentation: Spoon (fed; 10 trials)   Solid     Solid: Impaired (edentulous) Presentation: Spoon (fed; 8 trials) Oral Phase Impairments: Impaired mastication (edentulous, weak) Pharyngeal Phase Impairments:  (none)        Jerilynn Som, MS, CCC-SLP Speech Language Pathologist Rehab Services; [facility] [phone] (ascom) Edlin Steve 04/28/2023,5:08 PM

## 2023-04-28 NOTE — Evaluation (Signed)
Occupational Therapy Evaluation Patient Details Name: Steve Kelly MRN: [id number] DOB: [birth year] Today's Date: 04/28/2023   History of Present Illness 67 y.o. male who presents to the ED with altered mental status. +alcohol withdrawal; AKI; ?seizure 11/03; head CT no acute changes;   PMH significant of alcohol use disorder, HTN and vitamin D deficiency   Clinical Impression   Steve Kelly was seen for OT/PT evaluation this date. Pt poor historian and oriented to self and location as hospital [facility]). Reports using RW as needed and living with son. Pt currently requires MAX A don B socks in sitting. MOD A x2 + RW sit<>stand x3 trials and side steps along EOB. Pt would benefit from skilled OT to address noted impairments and functional limitations (see below for any additional details). Upon hospital discharge, recommend OT follow up.    If plan is discharge home, recommend the following: A lot of help with walking and/or transfers;A lot of help with bathing/dressing/bathroom;Supervision due to cognitive status;Help with stairs or ramp for entrance    Functional Status Assessment  Patient has had a recent decline in their functional status and demonstrates the ability to make significant improvements in function in a reasonable and predictable amount of time.  Equipment Recommendations  BSC/3in1    Recommendations for Other Services       Precautions / Restrictions Precautions Precautions: Fall Restrictions Weight Bearing Restrictions Per Provider Order: No      Mobility Bed Mobility Overal bed mobility: Needs Assistance Bed Mobility: Supine to Sit, Sit to Supine     Supine to sit: Mod assist Sit to supine: Contact guard assist        Transfers Overall transfer level: Needs assistance Equipment used: Rolling walker (2 wheels) Transfers: Sit to/from Stand Sit to Stand: Mod assist, +2 physical assistance                  Balance Overall balance assessment:  Needs assistance Sitting-balance support: No upper extremity supported Sitting balance-Leahy Scale: Fair     Standing balance support: No upper extremity supported Standing balance-Leahy Scale: Poor                             ADL either performed or assessed with clinical judgement   ADL Overall ADL's : Needs assistance/impaired                                       General ADL Comments: MAX A don B socks in sitting. MOD A x2 + RW simulated BSC t/f      Pertinent Vitals/Pain Pain Assessment Pain Assessment: No/denies pain     Extremity/Trunk Assessment Upper Extremity Assessment Upper Extremity Assessment: Defer to OT evaluation   Lower Extremity Assessment Lower Extremity Assessment: Generalized weakness   Cervical / Trunk Assessment Cervical / Trunk Assessment: Normal   Communication Communication Communication: Difficulty communicating thoughts/reduced clarity of speech;Difficulty following commands/understanding Following commands: Follows one step commands inconsistently;Follows one step commands with increased time Cueing Techniques: Verbal cues;Gestural cues;Tactile cues   Cognition Arousal: Alert Behavior During Therapy: Flat affect, Impulsive Overall Cognitive Status: Impaired/Different from baseline Area of Impairment: Orientation                 Orientation Level: Disoriented to, Place, Time, Situation       Safety/Judgement: Decreased awareness of deficits, Decreased awareness of safety  General Comments: states location as [facility] Living Family/patient expects to be discharged to:: Private residence Living Arrangements: Children Available Help at Discharge: Family;Available PRN/intermittently Type of Home: House Home Access: Stairs to enter [employer] of Steps: 2 Entrance Stairs-Rails: None Home Layout: One level     Bathroom Shower/Tub: Tub/shower  unit         Home Equipment: None   Additional Comments: Pt lives at home with son who is there all day, daughter in law works during the day      Prior Functioning/Environment Prior Level of Function : Independent/Modified Independent             Mobility Comments: says he has a RW that is old.          OT Problem List: Decreased strength;Decreased range of motion;Decreased activity tolerance;Impaired balance (sitting and/or standing)      OT Treatment/Interventions: Self-care/ADL training;Therapeutic exercise;Energy conservation;DME and/or AE instruction    OT Goals(Current goals can be found in the care plan section) Acute Rehab OT Goals Patient Stated Goal: to go home OT Goal Formulation: With patient Time For Goal Achievement: 05/12/23 Potential to Achieve Goals: Good ADL Goals Pt Will Perform Grooming: with modified independence;standing Pt Will Perform Lower Body Dressing: with modified independence;sit to/from stand Pt Will Transfer to Toilet: with modified independence;regular height toilet;ambulating  OT Frequency: Min 1X/week    Co-evaluation PT/OT/SLP Co-Evaluation/Treatment: Yes Reason for Co-Treatment: For patient/therapist safety;Necessary to address cognition/behavior during functional activity;To address functional/ADL transfers PT goals addressed during session: Mobility/safety with mobility;Balance;Proper use of DME OT goals addressed during session: ADL's and self-care      AM-PAC OT "6 Clicks" Daily Activity     Outcome Measure Help from another person eating meals?: None Help from another person taking care of personal grooming?: A Little Help from another person toileting, which includes using toliet, bedpan, or urinal?: A Lot Help from another person bathing (including washing, rinsing, drying)?: A Lot Help from another person to put on and taking off regular upper body clothing?: A Little Help from another person to put on and taking off  regular lower body clothing?: A Lot 6 Click Score: 16   End of Session    Activity Tolerance: Patient tolerated treatment well Patient left: in bed;with call bell/phone within reach;with bed alarm set  OT Visit Diagnosis: Other abnormalities of gait and mobility (R26.89);Muscle weakness (generalized) (M62.81)                Time: 4403-4742 OT Time Calculation (min): 15 min Charges:  OT General Charges $OT Visit: 1 Visit OT Evaluation $OT Eval Low Complexity: 1 Low  Kathie Dike, M.S. OTR/L  04/28/23, 2:54 PM  ascom [phone]

## 2023-04-29 DIAGNOSIS — G9341 Metabolic encephalopathy: Secondary | ICD-10-CM | POA: Diagnosis not present

## 2023-04-29 LAB — GLUCOSE, CAPILLARY: Glucose-Capillary: 78 mg/dL (ref 70–99)

## 2023-04-29 LAB — BASIC METABOLIC PANEL
Anion gap: 11 (ref 5–15)
BUN: 8 mg/dL (ref 8–23)
CO2: 24 mmol/L (ref 22–32)
Calcium: 9.3 mg/dL (ref 8.9–10.3)
Chloride: 100 mmol/L (ref 98–111)
Creatinine, Ser: 0.89 mg/dL (ref 0.61–1.24)
GFR, Estimated: >60 mL/min (ref >60)
Glucose, Bld: 89 mg/dL (ref 70–99)
Potassium: 3.8 mmol/L (ref 3.5–5.1)
Sodium: 135 mmol/L (ref 135–145)

## 2023-04-29 LAB — CORTISOL: Cortisol, Plasma: 11.5 ug/dL

## 2023-04-29 MED ORDER — COSYNTROPIN 0.25 MG IJ SOLR
0.2500 mg | Freq: Once | INTRAMUSCULAR | Status: AC
  Administered 2023-04-30: 0.25 mg via INTRAVENOUS
  Filled 2023-04-29: qty 0.25

## 2023-04-29 MED ORDER — LORAZEPAM 2 MG/ML IJ SOLN: 1.0000 mg | INTRAMUSCULAR | Status: AC | PRN

## 2023-04-29 MED ORDER — LORAZEPAM 1 MG PO TABS
1.0000 mg | ORAL_TABLET | ORAL | Status: AC | PRN
Start: 2023-04-29 — End: 2023-05-02
  Administered 2023-04-29: 2 mg via ORAL
  Administered 2023-04-30: 1 mg via ORAL
  Filled 2023-04-29: qty 1
  Filled 2023-04-29: qty 2

## 2023-04-29 NOTE — Care Management Important Message (Signed)
Important Message  Patient Details  Name: Steve Kelly MRN: [id number] Date of Birth: 11/03/1955   Important Message Given:  Yes - Medicare IM     Bernadette Hoit 04/29/2023, 9:41 AM

## 2023-04-29 NOTE — Plan of Care (Signed)

## 2023-04-29 NOTE — TOC Progression Note (Signed)
Transition of Care [facility]) - Progression Note    Patient Details  Name: Steve Kelly MRN: [id number] Date of Birth: Mar 07, 1956  Transition of Care [facility]) CM/SW Contact  Allena Katz, LCSW Phone Number: 04/29/2023, 10:05 AM  Clinical Narrative:   Recommendations are now for rehab. Pt not fully oriented. Message left with daughter.    Expected Discharge Plan: Home/Self Care    Expected Discharge Plan and Services       Living arrangements for the past 2 months: Single Family Home                                       Social Determinants of Health (SDOH) Interventions SDOH Screenings   Food Insecurity: No Food Insecurity (04/26/2023)  Housing: Low Risk  (04/28/2023)  Transportation Needs: No Transportation Needs (04/26/2023)  Utilities: Not At Risk (04/26/2023)  Tobacco Use: Low Risk  (04/23/2023)    Readmission Risk Interventions     No data to display

## 2023-04-29 NOTE — Progress Notes (Addendum)
Progress Note    Steve Kelly  [name]:[id number] DOB: Mar 07, 1956  DOA: 04/23/2023 PCP: Ellis Parents, FNP      Brief Narrative:    Medical records reviewed and are as summarized below:  Steve Kelly is a 67 y.o. male with medical history significant of hypertension, GERD, alcohol use disorder, fatty liver, delirium tremens, who presented to the hospital with altered mental status.    Per report, pt was last known normal at about 2:00 p.m. Reportedly had seizure-like activity followed by unresponsiveness and aphasia. Family noticed a staring spell initially. Code stroke was called.  Stat CT head and MRI brain were negative for stroke.  CT cervical spine was also negative for any acute abnormality.  It was thought to be due to alcohol withdrawal with seizure and Dts. He was started on CIWA protocol.  He was also evaluated by PCCM but he was deemed not to require ICU admission at that time. He was tachycardic, hypertensive and tachypneic on presentation.  UDS was positive for cannabinoids and alcohol level was less than 10.   12/14: Overall stable vitals with mild tachycardia, slowly rising troponin, currently at 118 after showing some improvement.  Starting on heparin infusion. Labs with B12 of 193, lipid panel with LDL of 119, ammonia of 30, TSH 0.975, A1c of 5.3, slight worsening of leukocytosis at 11.7 Cardiology was consulted and echo ordered.  12/15: Patient became febrile with maximum temperature recorded of 101.5 overnight.  Labs with resolution of prior leukocytosis, troponin peaked at 118, CIWA score of 14, echocardiogram done with pending results Repeat chest x-ray with worsening right mid to lower zone airspace disease, patient with worsening cough, starting on ceftriaxone and Zithromax for concern of pneumonia.  Ordered blood and sputum culture, strep pneumo and Legionella antigen, RVP and procalcitonin-which was elevated at 4.05. Repeat UA with few ketones  only.  12/16: Afebrile this morning with maximum temperature recorded of 102.3 over the past 24-hour.  Leukocytosis resolved, developed hypoglycemia. preliminary blood cultures negative, strep pneumo, RVP and MRSA swab negative.  Patient more alert and able to take p.o. so starting on diet. Echocardiogram with normal EF but found to have severe aortic stenosis which need further evaluation with right and left cardiac catheterization and thoracic surgery evaluation once mental status recovers.      Assessment/Plan:   Principal Problem:   Metabolic encephalopathy Active Problems:   CAP (community acquired pneumonia)   Alcohol withdrawal (HCC)   Delirium tremens (HCC)   Hypertension   Myocardial injury   Leukocytosis   Aortic stenosis   Elevated troponin   Slurred speech    Body mass index is 20.57 kg/m.   Acute metabolic encephalopathy, ? delirium tremens, suspected underlying dementia: CT and MRI brain negative for acute stroke.  He was evaluated by the neurologist.  Alcohol-related encephalopathy versus alcohol withdrawal seizures in the differential diagnosis.   Alcohol use disorder with alcohol withdrawal: Continue benzodiazepines per CIWA protocol, thiamine and multivitamin Continue Librium Plan to complete phenobarbital taper today   Community-acquired pneumonia: Completed 5-day of IV ceftriaxone and azithromycin today.   Elevated troponins: Likely due to demand ischemia.  He was initially treated with IV heparin drip but this was discontinued because no evidence of ACS.   Severe aortic stenosis: 2D echo showed EF 55 to 60% and severe aortic stenosis.  Plan for right and left heart cath as an inpatient has been aborted.  Cardiologist recommended outpatient follow-up for further evaluation.  Hypokalemia: Improved   Recurrent hypoglycemia: Glucose levels are stable.  Continue to monitor.  He is not on any hypoglycemic agent.  Encourage adequate oral  intake. Cortisol level 11.5.  Check ACTH stimulation test.   Low normal vitamin B12 (193): Continue vitamin B12 supplement   Leukocytosis: Resolved    Diet Order             DIET DYS 3 Room service appropriate? Yes with Assist; Fluid consistency: Thin  Diet effective now                            Consultants: Cardiologist Urologist  Procedures: None    Medications:    aspirin EC  81 mg Oral Daily   baclofen  10 mg Oral BID   carvedilol  6.25 mg Oral BID WC   chlordiazePOXIDE  10 mg Oral TID   vitamin B-12  1,000 mcg Oral Daily   enoxaparin (LOVENOX) injection  40 mg Subcutaneous Q24H   folic acid  1 mg Oral Daily   multivitamin with minerals  1 tablet Oral Daily   phenobarbital  32.4 mg Oral TID   thiamine  100 mg Oral Daily   Or   thiamine  100 mg Intravenous Daily   Continuous Infusions:  azithromycin Stopped (04/28/23 1910)   cefTRIAXone (ROCEPHIN)  IV 2 g (04/29/23 1436)     Anti-infectives (From admission, onward)    Start     Dose/Rate Route Frequency Ordered Stop   04/25/23 1400  azithromycin (ZITHROMAX) 500 mg in sodium chloride 0.9 % 250 mL IVPB        500 mg 250 mL/hr over 60 Minutes Intravenous Every 24 hours 04/25/23 1216     04/25/23 1330  cefTRIAXone (ROCEPHIN) 2 g in sodium chloride 0.9 % 100 mL IVPB        2 g 200 mL/hr over 30 Minutes Intravenous Every 24 hours 04/25/23 1216                Family Communication/Anticipated D/C date and plan/Code Status   DVT prophylaxis: enoxaparin (LOVENOX) injection 40 mg Start: 04/25/23 1000     Code Status: Full Code  Family Communication: Plan discussed with [name], daughter, over the phone Disposition Plan: Plan to discharge to SNF   Status is: Inpatient Remains inpatient appropriate because: Encephalopathy       Subjective:   Interval events noted.  He has no complaints.  He is still confused.  There has been reports of intermittent agitation by nursing  staff..  Objective:    Vitals:   04/28/23 2127 04/29/23 0513 04/29/23 0736 04/29/23 1141  BP: (!) 143/88 130/86 (!) 155/83 102/70  Pulse: 74 88 85 (!) 109  Resp: 18 17 16 18   Temp: 97.8 F (36.6 C) 98.6 F (37 C) 98.8 F (37.1 C)   TempSrc:      SpO2: 100% 92% 94% 94%  Weight:      Height:       No data found.   Intake/Output Summary (Last 24 hours) at 04/29/2023 1511 Last data filed at 04/29/2023 1308 Gross per 24 hour  Intake 590 ml  Output 950 ml  Net -360 ml   Filed Weights   04/23/23 2128 04/24/23 0700 04/26/23 0646  Weight: 64 kg 64 kg 63.2 kg    Exam:  GEN: NAD SKIN: Warm and dry EYES: No pallor or icterus ENT: MMM CV: RRR, ejection systolic murmur loudest in the  left upper sternal area PULM: CTA B ABD: soft, ND, NT, +BS CNS: AAO x 1 (person), non focal EXT: No edema or tenderness         Data Reviewed:   I have personally reviewed following labs and imaging studies:  Labs: Labs show the following:   Basic Metabolic Panel: Recent Labs  Lab 04/24/23 0500 04/25/23 0258 04/27/23 0340 04/28/23 0333 04/29/23 0539  NA 140 138 134* 135 135  K 3.6 3.5 3.1* 3.5 3.8  CL 106 106 101 100 100  CO2 25 24 24 25 24   GLUCOSE 107* 81 95 94 89  BUN 12 12 7* 6* 8  CREATININE 1.09 0.89 0.66 0.71 0.89  CALCIUM 8.9 8.8* 8.8* 9.1 9.3  MG  --   --  1.7 2.1  --   PHOS  --   --  3.8  --   --    GFR Estimated Creatinine Clearance: 74 mL/min (by C-G formula based on SCr of 0.89 mg/dL). Liver Function Tests: Recent Labs  Lab 04/23/23 1601  AST 29  ALT 11  ALKPHOS 85  BILITOT 1.0  PROT 8.6*  ALBUMIN 4.2   No results for input(s): "LIPASE", "AMYLASE" in the last 168 hours. Recent Labs  Lab 04/23/23 1724  AMMONIA 30   Coagulation profile Recent Labs  Lab 04/23/23 1601 04/24/23 0832  INR 1.1 1.3*    CBC: Recent Labs  Lab 04/23/23 1601 04/24/23 0500 04/25/23 0258 04/26/23 0501 04/27/23 0340  WBC 10.6* 11.7* 7.5 7.3 6.4  NEUTROABS  6.0  --   --   --   --   HGB 14.8 12.5* 11.3* 12.1* 11.4*  HCT 43.9 36.1* 32.8* 35.0* 32.4*  MCV 97.6 94.8 94.3 95.1 93.4  PLT 211 184 162 155 174   Cardiac Enzymes: No results for input(s): "CKTOTAL", "CKMB", "CKMBINDEX", "TROPONINI" in the last 168 hours. BNP (last 3 results) No results for input(s): "PROBNP" in the last 8760 hours. CBG: Recent Labs  Lab 04/26/23 1749 04/27/23 0734 04/28/23 0739 04/28/23 0741 04/29/23 0802  GLUCAP 89 81 67* 61* 78   D-Dimer: No results for input(s): "DDIMER" in the last 72 hours. Hgb A1c: No results for input(s): "HGBA1C" in the last 72 hours. Lipid Profile: No results for input(s): "CHOL", "HDL", "LDLCALC", "TRIG", "CHOLHDL", "LDLDIRECT" in the last 72 hours. Thyroid function studies: No results for input(s): "TSH", "T4TOTAL", "T3FREE", "THYROIDAB" in the last 72 hours.  Invalid input(s): "FREET3" Anemia work up: No results for input(s): "VITAMINB12", "FOLATE", "FERRITIN", "TIBC", "IRON", "RETICCTPCT" in the last 72 hours. Sepsis Labs: Recent Labs  Lab 04/24/23 0500 04/25/23 0258 04/26/23 0501 04/27/23 0340  PROCALCITON  --  4.05  --   --   WBC 11.7* 7.5 7.3 6.4    Microbiology Recent Results (from the past 240 hours)  MRSA Next Gen by PCR, Nasal     Status: None   Collection Time: 04/25/23  9:39 AM   Specimen: Nasal Mucosa; Nasal Swab  Result Value Ref Range Status   MRSA by PCR Next Gen NOT DETECTED NOT DETECTED Final    Comment: (NOTE) The GeneXpert MRSA Assay (FDA approved for NASAL specimens only), is one component of a comprehensive MRSA colonization surveillance program. It is not intended to diagnose MRSA infection nor to guide or monitor treatment for MRSA infections. Test performance is not FDA approved in patients less than 3 years old. Performed at [facility], [address], [place], [place] [address]   Respiratory (~20 pathogens) panel  by PCR     Status: None   Collection Time: 04/25/23 12:40  PM   Specimen: Nasopharyngeal Swab; Respiratory  Result Value Ref Range Status   Adenovirus NOT DETECTED NOT DETECTED Final   Coronavirus 229E NOT DETECTED NOT DETECTED Final    Comment: (NOTE) The Coronavirus on the Respiratory Panel, DOES NOT test for the novel  Coronavirus (2019 nCoV)    Coronavirus HKU1 NOT DETECTED NOT DETECTED Final   Coronavirus NL63 NOT DETECTED NOT DETECTED Final   Coronavirus OC43 NOT DETECTED NOT DETECTED Final   Metapneumovirus NOT DETECTED NOT DETECTED Final   Rhinovirus / Enterovirus NOT DETECTED NOT DETECTED Final   Influenza A NOT DETECTED NOT DETECTED Final   Influenza B NOT DETECTED NOT DETECTED Final   Parainfluenza Virus 1 NOT DETECTED NOT DETECTED Final   Parainfluenza Virus 2 NOT DETECTED NOT DETECTED Final   Parainfluenza Virus 3 NOT DETECTED NOT DETECTED Final   Parainfluenza Virus 4 NOT DETECTED NOT DETECTED Final   Respiratory Syncytial Virus NOT DETECTED NOT DETECTED Final   Bordetella pertussis NOT DETECTED NOT DETECTED Final   Bordetella Parapertussis NOT DETECTED NOT DETECTED Final   Chlamydophila pneumoniae NOT DETECTED NOT DETECTED Final   Mycoplasma pneumoniae NOT DETECTED NOT DETECTED Final    Comment: Performed at [facility], [address] [place], [place] [address]  Culture, blood (Routine X 2) w Reflex to ID Panel     Status: None (Preliminary result)   Collection Time: 04/25/23  1:31 PM   Specimen: BLOOD LEFT HAND  Result Value Ref Range Status   Specimen Description BLOOD LEFT HAND  Final   Special Requests   Final    BOTTLES DRAWN AEROBIC ONLY Blood Culture results may not be optimal due to an inadequate volume of blood received in culture bottles   Culture   Final    NO GROWTH 4 DAYS Performed at [facility], [address] [place], [place] [address]    Report Status PENDING  Incomplete  Culture, blood (Routine X 2) w Reflex to ID Panel     Status: None (Preliminary result)   Collection Time:  04/25/23  1:31 PM   Specimen: BLOOD RIGHT HAND  Result Value Ref Range Status   Specimen Description BLOOD RIGHT HAND  Final   Special Requests   Final    BOTTLES DRAWN AEROBIC AND ANAEROBIC Blood Culture adequate volume   Culture   Final    NO GROWTH 4 DAYS Performed at [facility], [address] [place], [place] [address]    Report Status PENDING  Incomplete    Procedures and diagnostic studies:  No results found.              LOS: 6 days   Stetson Pelaez  [facility]   Pager on [email or web address]. If 7PM-7AM, please contact night-coverage at [email or web address]     04/29/2023, 3:11 PM

## 2023-04-30 LAB — CULTURE, BLOOD (ROUTINE X 2)
Culture: NO GROWTH
Culture: NO GROWTH
Special Requests: ADEQUATE

## 2023-04-30 LAB — ACTH STIMULATION, 3 TIME POINTS
Cortisol, 30 Min: 23.9 ug/dL
Cortisol, 60 Min: 25.9 ug/dL
Cortisol, Base: 13.6 ug/dL

## 2023-04-30 LAB — GLUCOSE, CAPILLARY: Glucose-Capillary: 106 mg/dL — ABNORMAL HIGH (ref 70–99)

## 2023-04-30 MED ORDER — CHLORDIAZEPOXIDE HCL 5 MG PO CAPS
10.0000 mg | ORAL_CAPSULE | Freq: Two times a day (BID) | ORAL | Status: DC
  Administered 2023-04-30 – 2023-05-01 (×2): 10 mg via ORAL
  Filled 2023-04-30 (×2): qty 2

## 2023-04-30 NOTE — TOC Progression Note (Signed)
Transition of Care [facility]) - Progression Note    Patient Details  Name: Steve Kelly MRN: [id number] Date of Birth: 1956/04/25  Transition of Care [facility]) CM/SW Contact  Allena Katz, LCSW Phone Number: 04/30/2023, 10:07 AM  Clinical Narrative:   CSW spoke with pt who states he does want to go to rehab but he wants his son to decide where. CSW spoke with son. Son states pt just recently left [facility] and was there in his copay days in November. He states pt was able to take care of himself when he left. But states he is wanting pt to stay for LTC as he doesn't feel he is able to stay at home as he is gone during the day. Son is coming to the hospital to discuss this with the pt today. CSW will start workup, son wants referral sent out and would like pt to return to [facility].    Expected Discharge Plan: Skilled Nursing Facility    Expected Discharge Plan and Services       Living arrangements for the past 2 months: Single Family Home                                       Social Determinants of Health (SDOH) Interventions SDOH Screenings   Food Insecurity: No Food Insecurity (04/26/2023)  Housing: Low Risk  (04/28/2023)  Transportation Needs: No Transportation Needs (04/26/2023)  Utilities: Not At Risk (04/26/2023)  Tobacco Use: Low Risk  (04/23/2023)    Readmission Risk Interventions     No data to display

## 2023-04-30 NOTE — Plan of Care (Signed)

## 2023-04-30 NOTE — [place]2 (Signed)
[place] MEDICAID FL2 LEVEL OF CARE FORM     IDENTIFICATION  Patient Name: Steve Kelly Birthdate: 16-Feb-1956 Sex: male Admission Date (Current Location): 04/23/2023  [facility] and [place] Number:  Chiropodist and Address:  [facility], [address] [place], [place] [address]      Provider Number: [id number]  Attending Physician Name and Address:  Marolyn Haller, MD  Relative Name and Phone Number:  Son [phone]    Current Level of Care: Hospital Recommended Level of Care: Skilled Nursing Facility Prior Approval Number:    Date Approved/Denied:   PASRR Number: [id number]  Discharge Plan: SNF    Current Diagnoses: Patient Active Problem List   Diagnosis Date Noted   Slurred speech 04/28/2023   CAP (community acquired pneumonia) 04/25/2023   Aortic stenosis 04/25/2023   Elevated troponin 04/24/2023   Metabolic encephalopathy 04/23/2023   Delirium tremens (HCC) 04/23/2023   GERD (gastroesophageal reflux disease) 04/23/2023   Myocardial injury 04/23/2023   Leukocytosis 04/23/2023   Alcohol withdrawal (HCC) 03/14/2023   Acute alteration in mental status 03/14/2023   Generalized weakness 03/14/2023   History of alcohol use disorder 03/14/2023   Thrombocytopenia (HCC) 03/14/2023   Hypokalemia 03/14/2023   Alcohol abuse 10/09/2022   Heart murmur, systolic 10/09/2022   Hypertension 10/09/2022   Tremor 10/09/2022   Vitamin D deficiency 10/09/2022    Orientation RESPIRATION BLADDER Height & Weight     Self, Situation, Place, Time  Normal Incontinent Weight: 139 lb 5.3 oz (63.2 kg) Height:  5' 9.02" (175.3 cm)  BEHAVIORAL SYMPTOMS/MOOD NEUROLOGICAL BOWEL NUTRITION STATUS      Incontinent    AMBULATORY STATUS COMMUNICATION OF NEEDS Skin   Extensive Assist Verbally Normal                       Personal Care Assistance Level of Assistance  Bathing, Feeding, Dressing Bathing Assistance: Maximum  assistance Feeding assistance: Limited assistance Dressing Assistance: Maximum assistance     Functional Limitations Info  Sight, Hearing, Speech Sight Info: Adequate Hearing Info: Adequate Speech Info: Impaired    SPECIAL CARE FACTORS FREQUENCY  PT (By licensed PT), OT (By licensed OT)     PT Frequency: 5 times a week OT Frequency: 5 times a week            Contractures Contractures Info: Not present    Additional Factors Info  Code Status Code Status Info: FULL             Current Medications (04/30/2023):  This is the current hospital active medication list Current Facility-Administered Medications  Medication Dose Route Frequency Provider Last Rate Last Admin   acetaminophen (TYLENOL) suppository 650 mg  650 mg Rectal Q6H PRN Lurene Shadow, MD   650 mg at 04/25/23 2132   acetaminophen (TYLENOL) tablet 650 mg  650 mg Oral Q6H PRN Lurene Shadow, MD   650 mg at 04/30/23 0456   aspirin EC tablet 81 mg  81 mg Oral Daily Lurene Shadow, MD   81 mg at 04/30/23 8469   baclofen (LIORESAL) tablet 10 mg  10 mg Oral BID Lurene Shadow, MD   10 mg at 04/30/23 6295   carvedilol (COREG) tablet 6.25 mg  6.25 mg Oral BID WC Lurene Shadow, MD   6.25 mg at 04/30/23 2841   chlordiazePOXIDE (LIBRIUM) capsule 10 mg  10 mg Oral TID Lurene Shadow, MD   10 mg at 04/30/23 3244   cyanocobalamin (VITAMIN B12) tablet  1,000 mcg  1,000 mcg Oral Daily Lurene Shadow, MD   1,000 mcg at 04/30/23 0838   enoxaparin (LOVENOX) injection 40 mg  40 mg Subcutaneous Q24H Lurene Shadow, MD   40 mg at 04/30/23 6295   folic acid (FOLVITE) tablet 1 mg  1 mg Oral Daily Lurene Shadow, MD   1 mg at 04/30/23 2841   hydrALAZINE (APRESOLINE) injection 10 mg  10 mg Intravenous Q2H PRN Lurene Shadow, MD       LORazepam (ATIVAN) tablet 1-4 mg  1-4 mg Oral Q1H PRN Lurene Shadow, MD   1 mg at 04/30/23 3244   Or   LORazepam (ATIVAN) injection 1-4 mg  1-4 mg Intravenous Q1H PRN Lurene Shadow, MD       LORazepam  (ATIVAN) injection 2 mg  2 mg Intravenous Q2H PRN Lurene Shadow, MD   2 mg at 04/24/23 2250   multivitamin with minerals tablet 1 tablet  1 tablet Oral Daily Lurene Shadow, MD   1 tablet at 04/30/23 0837   ondansetron (ZOFRAN) injection 4 mg  4 mg Intravenous Q8H PRN Lurene Shadow, MD       thiamine (VITAMIN B1) tablet 100 mg  100 mg Oral Daily Lurene Shadow, MD   100 mg at 04/30/23 0102   Or   thiamine (VITAMIN B1) injection 100 mg  100 mg Intravenous Daily Lurene Shadow, MD   100 mg at 04/28/23 7253     Discharge Medications: Please see discharge summary for a list of discharge medications.  Relevant Imaging Results:  Relevant Lab Results:   Additional Information SSN:[id number]  Allena Katz, LCSW

## 2023-04-30 NOTE — Progress Notes (Signed)
Progress Note    Steve Kelly  OZD:[id number] DOB: 1955-08-26  DOA: 04/23/2023 PCP: Ellis Parents, FNP      Brief Narrative:    Medical records reviewed and are as summarized below:  Steve Kelly is a 67 y.o. male with medical history significant of hypertension, GERD, alcohol use disorder, fatty liver, delirium tremens, who presented to the hospital with altered mental status.    Per report, pt was last known normal at about 2:00 p.m. Reportedly had seizure-like activity followed by unresponsiveness and aphasia. Family noticed a staring spell initially. Code stroke was called.  Stat CT head and MRI brain were negative for stroke.  CT cervical spine was also negative for any acute abnormality.  It was thought to be due to alcohol withdrawal with seizure and Dts. He was started on CIWA protocol.  He was also evaluated by PCCM but he was deemed not to require ICU admission at that time. He was tachycardic, hypertensive and tachypneic on presentation.  UDS was positive for cannabinoids and alcohol level was less than 10.   12/14: Overall stable vitals with mild tachycardia, slowly rising troponin, currently at 118 after showing some improvement.  Starting on heparin infusion. Labs with B12 of 193, lipid panel with LDL of 119, ammonia of 30, TSH 0.975, A1c of 5.3, slight worsening of leukocytosis at 11.7 Cardiology was consulted and echo ordered.  12/15: Patient became febrile with maximum temperature recorded of 101.5 overnight.  Labs with resolution of prior leukocytosis, troponin peaked at 118, CIWA score of 14, echocardiogram done with pending results Repeat chest x-ray with worsening right mid to lower zone airspace disease, patient with worsening cough, starting on ceftriaxone and Zithromax for concern of pneumonia.  Ordered blood and sputum culture, strep pneumo and Legionella antigen, RVP and procalcitonin-which was elevated at 4.05. Repeat UA with few ketones  only.  12/16: Afebrile this morning with maximum temperature recorded of 102.3 over the past 24-hour.  Leukocytosis resolved, developed hypoglycemia. preliminary blood cultures negative, strep pneumo, RVP and MRSA swab negative.  Patient more alert and able to take p.o. so starting on diet. Echocardiogram with normal EF but found to have severe aortic stenosis which need further evaluation with right and left cardiac catheterization and thoracic surgery evaluation once mental status recovers.      Assessment/Plan:   Principal Problem:   Metabolic encephalopathy Active Problems:   CAP (community acquired pneumonia)   Alcohol withdrawal (HCC)   Delirium tremens (HCC)   Hypertension   Myocardial injury   Leukocytosis   Aortic stenosis   Elevated troponin   Slurred speech    Body mass index is 20.57 kg/m.   Acute metabolic encephalopathy, ? delirium tremens, suspected underlying dementia: CT and MRI brain negative for acute stroke.  He was evaluated by the neurologist.  Alcohol-related encephalopathy versus alcohol withdrawal seizures in the differential diagnosis.   Alcohol use disorder with alcohol withdrawal: Continue CIWA protocol with benzodiazepines, thiamine and multivitamin Continue Librium will decrease to BID dosing today S/p phenobarbital taper   Community-acquired pneumonia:  Completed 5-day of IV ceftriaxone and azithromycin 12/19   Acute nonischemic myocardial injury : ECG without evidence of acute ischemia. Troponin peaked at 110s. Likely due to elevated blood pressure or possible seizure like activity on admission related to his alcohol use.     Severe aortic stenosis: 2D echo showed EF 55 to 60% and severe aortic stenosis.  Plan for right and left heart cath as  an inpatient has been aborted.  Cardiologist recommended outpatient follow-up for further evaluation.  Recurrent hypoglycemia: Glucose levels are stable.  Continue to monitor.  He is not on any  hypoglycemic agent. Synthetic liver function is normal. No evidence of liver injury  Cortisol level 11.5. ACTH stimulation test is normal.  -Continue to encourage PO intake.    Low normal vitamin B12 (193): Continue vitamin B12 supplement   Leukocytosis: Resolved    Diet Order             DIET DYS 3 Room service appropriate? Yes with Assist; Fluid consistency: Thin  Diet effective now                            Consultants: Cardiologist Urologist  Procedures: None    Medications:    aspirin EC  81 mg Oral Daily   baclofen  10 mg Oral BID   carvedilol  6.25 mg Oral BID WC   chlordiazePOXIDE  10 mg Oral TID   vitamin B-12  1,000 mcg Oral Daily   enoxaparin (LOVENOX) injection  40 mg Subcutaneous Q24H   folic acid  1 mg Oral Daily   multivitamin with minerals  1 tablet Oral Daily   thiamine  100 mg Oral Daily   Or   thiamine  100 mg Intravenous Daily   Continuous Infusions:     Anti-infectives (From admission, onward)    Start     Dose/Rate Route Frequency Ordered Stop   04/25/23 1400  azithromycin (ZITHROMAX) 500 mg in sodium chloride 0.9 % 250 mL IVPB        500 mg 250 mL/hr over 60 Minutes Intravenous Every 24 hours 04/25/23 1216 04/29/23 1929   04/25/23 1330  cefTRIAXone (ROCEPHIN) 2 g in sodium chloride 0.9 % 100 mL IVPB  Status:  Discontinued        2 g 200 mL/hr over 30 Minutes Intravenous Every 24 hours 04/25/23 1216 04/29/23 1515              Family Communication/Anticipated D/C date and plan/Code Status   DVT prophylaxis: enoxaparin (LOVENOX) injection 40 mg Start: 04/25/23 1000     Code Status: Full Code  Family Communication: Plan discussed with [name], daughter, over the phone Disposition Plan: Plan to discharge to SNF   Status is: Inpatient Remains inpatient appropriate because: Encephalopathy       Subjective:   Interval events noted.  He has no complaints.  He is still confused.  There has been  reports of intermittent agitation by nursing staff..  Objective:    Vitals:   04/29/23 2102 04/30/23 0546 04/30/23 0754 04/30/23 0902  BP: 98/69 113/75 110/71 105/60  Pulse: 90 92 87 77  Resp: 17 16 16    Temp: 99.5 F (37.5 C) 98.9 F (37.2 C) 98.3 F (36.8 C)   TempSrc:      SpO2: 95% 94% 92%   Weight:      Height:       No data found.   Intake/Output Summary (Last 24 hours) at 04/30/2023 1502 Last data filed at 04/30/2023 0454 Gross per 24 hour  Intake 490 ml  Output 250 ml  Net 240 ml   Filed Weights   04/23/23 2128 04/24/23 0700 04/26/23 0646  Weight: 64 kg 64 kg 63.2 kg      Constitutional: Thin and in no distress.  Eyes: Sclera are anicteric  Cardiovascular: Normal rate, regular rhythm, intact distal pulses.  No lower extremity edema  Pulmonary: Non labored breathing on room air, no wheezing or rales  Abdominal: Soft. Normal bowel sounds. Non distended and non tender Neurological: Alert and oriented to person Moves all four extremities without difficulty  Skin: Skin is warm and dry.        Data Reviewed:   I have personally reviewed following labs and imaging studies:  Labs: Labs show the following:   Basic Metabolic Panel: Recent Labs  Lab 04/24/23 0500 04/25/23 0258 04/27/23 0340 04/28/23 0333 04/29/23 0539  NA 140 138 134* 135 135  K 3.6 3.5 3.1* 3.5 3.8  CL 106 106 101 100 100  CO2 25 24 24 25 24   GLUCOSE 107* 81 95 94 89  BUN 12 12 7* 6* 8  CREATININE 1.09 0.89 0.66 0.71 0.89  CALCIUM 8.9 8.8* 8.8* 9.1 9.3  MG  --   --  1.7 2.1  --   PHOS  --   --  3.8  --   --    GFR Estimated Creatinine Clearance: 74 mL/min (by C-G formula based on SCr of 0.89 mg/dL). Liver Function Tests: Recent Labs  Lab 04/23/23 1601  AST 29  ALT 11  ALKPHOS 85  BILITOT 1.0  PROT 8.6*  ALBUMIN 4.2   No results for input(s): "LIPASE", "AMYLASE" in the last 168 hours. Recent Labs  Lab 04/23/23 1724  AMMONIA 30   Coagulation profile Recent Labs   Lab 04/23/23 1601 04/24/23 0832  INR 1.1 1.3*    CBC: Recent Labs  Lab 04/23/23 1601 04/24/23 0500 04/25/23 0258 04/26/23 0501 04/27/23 0340  WBC 10.6* 11.7* 7.5 7.3 6.4  NEUTROABS 6.0  --   --   --   --   HGB 14.8 12.5* 11.3* 12.1* 11.4*  HCT 43.9 36.1* 32.8* 35.0* 32.4*  MCV 97.6 94.8 94.3 95.1 93.4  PLT 211 184 162 155 174   Cardiac Enzymes: No results for input(s): "CKTOTAL", "CKMB", "CKMBINDEX", "TROPONINI" in the last 168 hours. BNP (last 3 results) No results for input(s): "PROBNP" in the last 8760 hours. CBG: Recent Labs  Lab 04/27/23 0734 04/28/23 0739 04/28/23 0741 04/29/23 0802 04/30/23 0755  GLUCAP 81 67* 61* 78 106*   D-Dimer: No results for input(s): "DDIMER" in the last 72 hours. Hgb A1c: No results for input(s): "HGBA1C" in the last 72 hours. Lipid Profile: No results for input(s): "CHOL", "HDL", "LDLCALC", "TRIG", "CHOLHDL", "LDLDIRECT" in the last 72 hours. Thyroid function studies: No results for input(s): "TSH", "T4TOTAL", "T3FREE", "THYROIDAB" in the last 72 hours.  Invalid input(s): "FREET3" Anemia work up: No results for input(s): "VITAMINB12", "FOLATE", "FERRITIN", "TIBC", "IRON", "RETICCTPCT" in the last 72 hours. Sepsis Labs: Recent Labs  Lab 04/24/23 0500 04/25/23 0258 04/26/23 0501 04/27/23 0340  PROCALCITON  --  4.05  --   --   WBC 11.7* 7.5 7.3 6.4    Microbiology Recent Results (from the past 240 hours)  MRSA Next Gen by PCR, Nasal     Status: None   Collection Time: 04/25/23  9:39 AM   Specimen: Nasal Mucosa; Nasal Swab  Result Value Ref Range Status   MRSA by PCR Next Gen NOT DETECTED NOT DETECTED Final    Comment: (NOTE) The GeneXpert MRSA Assay (FDA approved for NASAL specimens only), is one component of a comprehensive MRSA colonization surveillance program. It is not intended to diagnose MRSA infection nor to guide or monitor treatment for MRSA infections. Test performance is not FDA approved in patients less  than 75 years old. Performed at [facility], [address] [place], [place] [address]   Respiratory (~20 pathogens) panel by PCR     Status: None   Collection Time: 04/25/23 12:40 PM   Specimen: Nasopharyngeal Swab; Respiratory  Result Value Ref Range Status   Adenovirus NOT DETECTED NOT DETECTED Final   Coronavirus 229E NOT DETECTED NOT DETECTED Final    Comment: (NOTE) The Coronavirus on the Respiratory Panel, DOES NOT test for the novel  Coronavirus (2019 nCoV)    Coronavirus HKU1 NOT DETECTED NOT DETECTED Final   Coronavirus NL63 NOT DETECTED NOT DETECTED Final   Coronavirus OC43 NOT DETECTED NOT DETECTED Final   Metapneumovirus NOT DETECTED NOT DETECTED Final   Rhinovirus / Enterovirus NOT DETECTED NOT DETECTED Final   Influenza A NOT DETECTED NOT DETECTED Final   Influenza B NOT DETECTED NOT DETECTED Final   Parainfluenza Virus 1 NOT DETECTED NOT DETECTED Final   Parainfluenza Virus 2 NOT DETECTED NOT DETECTED Final   Parainfluenza Virus 3 NOT DETECTED NOT DETECTED Final   Parainfluenza Virus 4 NOT DETECTED NOT DETECTED Final   Respiratory Syncytial Virus NOT DETECTED NOT DETECTED Final   Bordetella pertussis NOT DETECTED NOT DETECTED Final   Bordetella Parapertussis NOT DETECTED NOT DETECTED Final   Chlamydophila pneumoniae NOT DETECTED NOT DETECTED Final   Mycoplasma pneumoniae NOT DETECTED NOT DETECTED Final    Comment: Performed at [facility], [address] [place], [place] [address]  Culture, blood (Routine X 2) w Reflex to ID Panel     Status: None   Collection Time: 04/25/23  1:31 PM   Specimen: BLOOD LEFT HAND  Result Value Ref Range Status   Specimen Description BLOOD LEFT HAND  Final   Special Requests   Final    BOTTLES DRAWN AEROBIC ONLY Blood Culture results may not be optimal due to an inadequate volume of blood received in culture bottles   Culture   Final    NO GROWTH 5 DAYS Performed at [facility], [address],  [place], [place] [address]    Report Status 04/30/2023 FINAL  Final  Culture, blood (Routine X 2) w Reflex to ID Panel     Status: None   Collection Time: 04/25/23  1:31 PM   Specimen: BLOOD RIGHT HAND  Result Value Ref Range Status   Specimen Description BLOOD RIGHT HAND  Final   Special Requests   Final    BOTTLES DRAWN AEROBIC AND ANAEROBIC Blood Culture adequate volume   Culture   Final    NO GROWTH 5 DAYS Performed at [facility], [address] [place], [place] [address]    Report Status 04/30/2023 FINAL  Final    Procedures and diagnostic studies:  No results found.              LOS: 7 days   Marolyn Haller MD   [facility]   Pager on [email or web address]. If 7PM-7AM, please contact night-coverage at [email or web address]     04/30/2023, 3:02 PM

## 2023-05-01 LAB — CBC
HCT: 34.2 % — ABNORMAL LOW (ref 39.0–52.0)
Hemoglobin: 12 g/dL — ABNORMAL LOW (ref 13.0–17.0)
MCH: 32.3 pg (ref 26.0–34.0)
MCHC: 35.1 g/dL (ref 30.0–36.0)
MCV: 92.2 fL (ref 80.0–100.0)
Platelets: 281 10*3/uL (ref 150–400)
RBC: 3.71 MIL/uL — ABNORMAL LOW (ref 4.22–5.81)
RDW: 12.7 % (ref 11.5–15.5)
WBC: 7.5 10*3/uL (ref 4.0–10.5)
nRBC: 0 % (ref 0.0–0.2)

## 2023-05-01 LAB — COMPREHENSIVE METABOLIC PANEL
ALT: 16 U/L (ref 0–44)
AST: 23 U/L (ref 15–41)
Albumin: 3.2 g/dL — ABNORMAL LOW (ref 3.5–5.0)
Alkaline Phosphatase: 52 U/L (ref 38–126)
Anion gap: 11 (ref 5–15)
BUN: 12 mg/dL (ref 8–23)
CO2: 24 mmol/L (ref 22–32)
Calcium: 9.5 mg/dL (ref 8.9–10.3)
Chloride: 100 mmol/L (ref 98–111)
Creatinine, Ser: 0.75 mg/dL (ref 0.61–1.24)
GFR, Estimated: >60 mL/min (ref >60)
Glucose, Bld: 97 mg/dL (ref 70–99)
Potassium: 4.1 mmol/L (ref 3.5–5.1)
Sodium: 135 mmol/L (ref 135–145)
Total Bilirubin: 0.5 mg/dL (ref <1.2)
Total Protein: 7.6 g/dL (ref 6.5–8.1)

## 2023-05-01 LAB — MAGNESIUM: Magnesium: 1.9 mg/dL (ref 1.7–2.4)

## 2023-05-01 LAB — PHOSPHORUS: Phosphorus: 3.8 mg/dL (ref 2.5–4.6)

## 2023-05-01 LAB — GLUCOSE, CAPILLARY: Glucose-Capillary: 81 mg/dL (ref 70–99)

## 2023-05-01 MED ORDER — BISACODYL 10 MG RE SUPP
10.0000 mg | Freq: Every day | RECTAL | Status: DC | PRN
  Administered 2023-05-02: 10 mg via RECTAL
  Filled 2023-05-01: qty 1

## 2023-05-01 MED ORDER — SENNA 8.6 MG PO TABS
1.0000 | ORAL_TABLET | Freq: Every day | ORAL | Status: DC
  Administered 2023-05-01 – 2023-05-07 (×6): 8.6 mg via ORAL
  Filled 2023-05-01 (×7): qty 1

## 2023-05-01 MED ORDER — CHLORDIAZEPOXIDE HCL 5 MG PO CAPS
10.0000 mg | ORAL_CAPSULE | Freq: Every day | ORAL | Status: AC
  Administered 2023-05-02 – 2023-05-03 (×2): 10 mg via ORAL
  Filled 2023-05-01 (×2): qty 2

## 2023-05-01 MED ORDER — POLYETHYLENE GLYCOL 3350 17 G PO PACK
17.0000 g | PACK | Freq: Every day | ORAL | Status: DC
  Administered 2023-05-01 – 2023-05-07 (×4): 17 g via ORAL
  Filled 2023-05-01 (×6): qty 1

## 2023-05-01 NOTE — Plan of Care (Addendum)
Patient is alert and oriented. He does not have bowel movement since 12/17.MD made aware. Laxatives given as order. Plan of care ongoing.   Problem: Education: Goal: Knowledge of General Education information will improve Description: Including pain rating scale, medication(s)/side effects and non-pharmacologic comfort measures Outcome: Progressing   Problem: Health Behavior/Discharge Planning: Goal: Ability to manage health-related needs will improve Outcome: Progressing   Problem: Clinical Measurements: Goal: Ability to maintain clinical measurements within normal limits will improve Outcome: Progressing Goal: Will remain free from infection Outcome: Progressing Goal: Diagnostic test results will improve Outcome: Progressing Goal: Respiratory complications will improve Outcome: Progressing Goal: Cardiovascular complication will be avoided Outcome: Progressing   Problem: Activity: Goal: Risk for activity intolerance will decrease Outcome: Progressing   Problem: Nutrition: Goal: Adequate nutrition will be maintained Outcome: Progressing   Problem: Coping: Goal: Level of anxiety will decrease Outcome: Progressing   Problem: Elimination: Goal: Will not experience complications related to bowel motility Outcome: Progressing Goal: Will not experience complications related to urinary retention Outcome: Progressing   Problem: Pain Management: Goal: General experience of comfort will improve Outcome: Progressing   Problem: Safety: Goal: Ability to remain free from injury will improve Outcome: Progressing   Problem: Skin Integrity: Goal: Risk for impaired skin integrity will decrease Outcome: Progressing

## 2023-05-01 NOTE — Progress Notes (Signed)
Progress Note    BRANTON ALLENDE  ZOX:[id number] DOB: 02/03/1956  DOA: 04/23/2023 PCP: Ellis Parents, FNP      Brief Narrative:    Medical records reviewed and are as summarized below:  Steve Kelly is a 67 y.o. male with medical history significant of hypertension, GERD, alcohol use disorder, fatty liver, delirium tremens, who presented to the hospital with altered mental status.    Per report, pt was last known normal at about 2:00 p.m. Reportedly had seizure-like activity followed by unresponsiveness and aphasia. Family noticed a staring spell initially. Code stroke was called.  Stat CT head and MRI brain were negative for stroke.  CT cervical spine was also negative for any acute abnormality.  It was thought to be due to alcohol withdrawal with seizure and Dts. He was started on CIWA protocol.  He was also evaluated by PCCM but he was deemed not to require ICU admission at that time. He was tachycardic, hypertensive and tachypneic on presentation.  UDS was positive for cannabinoids and alcohol level was less than 10.   12/14: Overall stable vitals with mild tachycardia, slowly rising troponin, currently at 118 after showing some improvement.  Starting on heparin infusion. Labs with B12 of 193, lipid panel with LDL of 119, ammonia of 30, TSH 0.975, A1c of 5.3, slight worsening of leukocytosis at 11.7 Cardiology was consulted and echo ordered.  12/15: Patient became febrile with maximum temperature recorded of 101.5 overnight.  Labs with resolution of prior leukocytosis, troponin peaked at 118, CIWA score of 14, echocardiogram done with pending results Repeat chest x-ray with worsening right mid to lower zone airspace disease, patient with worsening cough, starting on ceftriaxone and Zithromax for concern of pneumonia.  Ordered blood and sputum culture, strep pneumo and Legionella antigen, RVP and procalcitonin-which was elevated at 4.05. Repeat UA with few ketones  only.  12/16: Afebrile this morning with maximum temperature recorded of 102.3 over the past 24-hour.  Leukocytosis resolved, developed hypoglycemia. preliminary blood cultures negative, strep pneumo, RVP and MRSA swab negative.  Patient more alert and able to take p.o. so starting on diet. Echocardiogram with normal EF but found to have severe aortic stenosis which need further evaluation with right and left cardiac catheterization and thoracic surgery evaluation once mental status recovers.      Assessment/Plan:   Principal Problem:   Metabolic encephalopathy Active Problems:   CAP (community acquired pneumonia)   Alcohol withdrawal (HCC)   Delirium tremens (HCC)   Hypertension   Myocardial injury   Leukocytosis   Aortic stenosis   Elevated troponin   Slurred speech    Body mass index is 20.57 kg/m.   Acute metabolic encephalopathy, unclear baseline but appears resolved  Was thought to be due to? delirium tremens, alcohol intoxication, versus alcohol withdrawal seizures with suspected underlying dementia: CT and MRI brain negative for acute stroke.  He was evaluated by the neurologist.     Alcohol use disorder with alcohol withdrawal:  S/p phenobarbital taper Not requiring as needed benzodiazepines  Continue CIWA protocol without benzodiazepines, thiamine and multivitamin Decrease Librium to daily dosing for two days    Community-acquired pneumonia: Resolved  No SOB, or chest pain. No cough. S/p Completed IV ceftriaxone and azithromycin 12/19   Acute nonischemic myocardial injury : ECG without evidence of acute ischemia. Troponin peaked at 110s. Likely due to elevated blood pressure or possible seizure like activity on admission related to his alcohol use.  Severe aortic stenosis: 2D echo showed EF 55 to 60% and severe aortic stenosis.  Plan for right and left heart cath as an inpatient has been aborted.  Cardiologist recommended outpatient follow-up for further  evaluation.  Recurrent hypoglycemia: Resolved. Glucose levels are stable.  Continue to monitor.  He is not on any hypoglycemic agent. Synthetic liver function is normal. No evidence of liver injury  Cortisol level 11.5. ACTH stimulation test is normal.  -Continue to encourage PO intake.   Normocytic anemia     Latest Ref Rng & Units 05/01/2023    4:53 AM 04/27/2023    3:40 AM 04/26/2023    5:01 AM  CBC  WBC 4.0 - 10.5 K/uL 7.5  6.4  7.3   Hemoglobin 13.0 - 17.0 g/dL 28.4  13.2  44.0   Hematocrit 39.0 - 52.0 % 34.2  32.4  35.0   Platelets 150 - 400 K/uL 281  174  155   Stable.  Low normal vitamin B12 (193): Continue vitamin B12 supplement -F/u iron panel   Leukocytosis: Resolved    Diet Order             DIET DYS 3 Room service appropriate? Yes with Assist; Fluid consistency: Thin  Diet effective now                            Consultants: Cardiologist Urologist  Procedures: None    Medications:    aspirin EC  81 mg Oral Daily   baclofen  10 mg Oral BID   carvedilol  6.25 mg Oral BID WC   chlordiazePOXIDE  10 mg Oral BID   vitamin B-12  1,000 mcg Oral Daily   enoxaparin (LOVENOX) injection  40 mg Subcutaneous Q24H   folic acid  1 mg Oral Daily   multivitamin with minerals  1 tablet Oral Daily   polyethylene glycol  17 g Oral Daily   senna  1 tablet Oral Daily   thiamine  100 mg Oral Daily   Or   thiamine  100 mg Intravenous Daily   Continuous Infusions:     Anti-infectives (From admission, onward)    Start     Dose/Rate Route Frequency Ordered Stop   04/25/23 1400  azithromycin (ZITHROMAX) 500 mg in sodium chloride 0.9 % 250 mL IVPB        500 mg 250 mL/hr over 60 Minutes Intravenous Every 24 hours 04/25/23 1216 04/29/23 1929   04/25/23 1330  cefTRIAXone (ROCEPHIN) 2 g in sodium chloride 0.9 % 100 mL IVPB  Status:  Discontinued        2 g 200 mL/hr over 30 Minutes Intravenous Every 24 hours 04/25/23 1216 04/29/23 1515               Family Communication/Anticipated D/C date and plan/Code Status   DVT prophylaxis: enoxaparin (LOVENOX) injection 40 mg Start: 04/25/23 1000     Code Status: Full Code  Family Communication: Plan discussed with [name], daughter, over the phone Disposition Plan: Plan to discharge to SNF   Status is: Inpatient Remains inpatient appropriate because: Encephalopathy       Subjective:   He has no complaints. He is alert and oriented to self only, which is stable for patient. He is able to carry coherent conversation.   Objective:    Vitals:   05/01/23 0519 05/01/23 0754 05/01/23 1200 05/01/23 1540  BP: 128/78 134/81  132/76  Pulse: 88 80 84 87  Resp: 18 16  18   Temp: 98.3 F (36.8 C) 99.6 F (37.6 C)  100 F (37.8 C)  TempSrc:    Oral  SpO2: 92% 95%  95%  Weight:      Height:       No data found.   Intake/Output Summary (Last 24 hours) at 05/01/2023 1827 Last data filed at 05/01/2023 1746 Gross per 24 hour  Intake 100 ml  Output 850 ml  Net -750 ml   Filed Weights   04/23/23 2128 04/24/23 0700 04/26/23 0646  Weight: 64 kg 64 kg 63.2 kg      Physical Exam  Constitutional: In no distress.  Cardiovascular: Normal rate, regular rhythm. No lower extremity edema  Pulmonary: Non labored breathing on room air, no wheezing or rales. Abdominal: Soft. Normal bowel sounds. Non distended and non tender Musculoskeletal: Normal range of motion.     Neurological: Alert and oriented to person, place, and time. Non focal  Skin: Skin is warm and dry.     Data Reviewed:   I have personally reviewed following labs and imaging studies:  Labs: Labs show the following:   Basic Metabolic Panel: Recent Labs  Lab 04/25/23 0258 04/27/23 0340 04/28/23 0333 04/29/23 0539 05/01/23 0453  NA 138 134* 135 135 135  K 3.5 3.1* 3.5 3.8 4.1  CL 106 101 100 100 100  CO2 24 24 25 24 24   GLUCOSE 81 95 94 89 97  BUN 12 7* 6* 8 12  CREATININE 0.89 0.66 0.71  0.89 0.75  CALCIUM 8.8* 8.8* 9.1 9.3 9.5  MG  --  1.7 2.1  --  1.9  PHOS  --  3.8  --   --  3.8   GFR Estimated Creatinine Clearance: 82.3 mL/min (by C-G formula based on SCr of 0.75 mg/dL). Liver Function Tests: Recent Labs  Lab 05/01/23 0453  AST 23  ALT 16  ALKPHOS 52  BILITOT 0.5  PROT 7.6  ALBUMIN 3.2*   No results for input(s): "LIPASE", "AMYLASE" in the last 168 hours. No results for input(s): "AMMONIA" in the last 168 hours.  Coagulation profile No results for input(s): "INR", "PROTIME" in the last 168 hours.   CBC: Recent Labs  Lab 04/25/23 0258 04/26/23 0501 04/27/23 0340 05/01/23 0453  WBC 7.5 7.3 6.4 7.5  HGB 11.3* 12.1* 11.4* 12.0*  HCT 32.8* 35.0* 32.4* 34.2*  MCV 94.3 95.1 93.4 92.2  PLT 162 155 174 281   Cardiac Enzymes: No results for input(s): "CKTOTAL", "CKMB", "CKMBINDEX", "TROPONINI" in the last 168 hours. BNP (last 3 results) No results for input(s): "PROBNP" in the last 8760 hours. CBG: Recent Labs  Lab 04/28/23 0739 04/28/23 0741 04/29/23 0802 04/30/23 0755 05/01/23 0755  GLUCAP 67* 61* 78 106* 81   D-Dimer: No results for input(s): "DDIMER" in the last 72 hours. Hgb A1c: No results for input(s): "HGBA1C" in the last 72 hours. Lipid Profile: No results for input(s): "CHOL", "HDL", "LDLCALC", "TRIG", "CHOLHDL", "LDLDIRECT" in the last 72 hours. Thyroid function studies: No results for input(s): "TSH", "T4TOTAL", "T3FREE", "THYROIDAB" in the last 72 hours.  Invalid input(s): "FREET3" Anemia work up: No results for input(s): "VITAMINB12", "FOLATE", "FERRITIN", "TIBC", "IRON", "RETICCTPCT" in the last 72 hours. Sepsis Labs: Recent Labs  Lab 04/25/23 0258 04/26/23 0501 04/27/23 0340 05/01/23 0453  PROCALCITON 4.05  --   --   --   WBC 7.5 7.3 6.4 7.5    Microbiology Recent Results (from the past 240 hours)  MRSA Next  Gen by PCR, Nasal     Status: None   Collection Time: 04/25/23  9:39 AM   Specimen: Nasal Mucosa; Nasal  Swab  Result Value Ref Range Status   MRSA by PCR Next Gen NOT DETECTED NOT DETECTED Final    Comment: (NOTE) The GeneXpert MRSA Assay (FDA approved for NASAL specimens only), is one component of a comprehensive MRSA colonization surveillance program. It is not intended to diagnose MRSA infection nor to guide or monitor treatment for MRSA infections. Test performance is not FDA approved in patients less than 52 years old. Performed at [facility], [address] [place], [place] [address]   Respiratory (~20 pathogens) panel by PCR     Status: None   Collection Time: 04/25/23 12:40 PM   Specimen: Nasopharyngeal Swab; Respiratory  Result Value Ref Range Status   Adenovirus NOT DETECTED NOT DETECTED Final   Coronavirus 229E NOT DETECTED NOT DETECTED Final    Comment: (NOTE) The Coronavirus on the Respiratory Panel, DOES NOT test for the novel  Coronavirus (2019 nCoV)    Coronavirus HKU1 NOT DETECTED NOT DETECTED Final   Coronavirus NL63 NOT DETECTED NOT DETECTED Final   Coronavirus OC43 NOT DETECTED NOT DETECTED Final   Metapneumovirus NOT DETECTED NOT DETECTED Final   Rhinovirus / Enterovirus NOT DETECTED NOT DETECTED Final   Influenza A NOT DETECTED NOT DETECTED Final   Influenza B NOT DETECTED NOT DETECTED Final   Parainfluenza Virus 1 NOT DETECTED NOT DETECTED Final   Parainfluenza Virus 2 NOT DETECTED NOT DETECTED Final   Parainfluenza Virus 3 NOT DETECTED NOT DETECTED Final   Parainfluenza Virus 4 NOT DETECTED NOT DETECTED Final   Respiratory Syncytial Virus NOT DETECTED NOT DETECTED Final   Bordetella pertussis NOT DETECTED NOT DETECTED Final   Bordetella Parapertussis NOT DETECTED NOT DETECTED Final   Chlamydophila pneumoniae NOT DETECTED NOT DETECTED Final   Mycoplasma pneumoniae NOT DETECTED NOT DETECTED Final    Comment: Performed at [facility], [address] [place], [place] [address]  Culture, blood (Routine X 2) w Reflex to ID Panel     Status:  None   Collection Time: 04/25/23  1:31 PM   Specimen: BLOOD LEFT HAND  Result Value Ref Range Status   Specimen Description BLOOD LEFT HAND  Final   Special Requests   Final    BOTTLES DRAWN AEROBIC ONLY Blood Culture results may not be optimal due to an inadequate volume of blood received in culture bottles   Culture   Final    NO GROWTH 5 DAYS Performed at [facility], [address] [place], [place] [address]    Report Status 04/30/2023 FINAL  Final  Culture, blood (Routine X 2) w Reflex to ID Panel     Status: None   Collection Time: 04/25/23  1:31 PM   Specimen: BLOOD RIGHT HAND  Result Value Ref Range Status   Specimen Description BLOOD RIGHT HAND  Final   Special Requests   Final    BOTTLES DRAWN AEROBIC AND ANAEROBIC Blood Culture adequate volume   Culture   Final    NO GROWTH 5 DAYS Performed at [facility], [address] [place], [place] [address]    Report Status 04/30/2023 FINAL  Final    Procedures and diagnostic studies:  No results found.              LOS: 8 days   Marolyn Haller MD   [facility]   Pager  on [email or web address]. If 7PM-7AM, please contact night-coverage at [email or web address]     05/01/2023, 6:27 PM

## 2023-05-01 NOTE — Plan of Care (Signed)

## 2023-05-02 LAB — CBC
HCT: 34.9 % — ABNORMAL LOW (ref 39.0–52.0)
Hemoglobin: 12.1 g/dL — ABNORMAL LOW (ref 13.0–17.0)
MCH: 32 pg (ref 26.0–34.0)
MCHC: 34.7 g/dL (ref 30.0–36.0)
MCV: 92.3 fL (ref 80.0–100.0)
Platelets: 327 10*3/uL (ref 150–400)
RBC: 3.78 MIL/uL — ABNORMAL LOW (ref 4.22–5.81)
RDW: 12.8 % (ref 11.5–15.5)
WBC: 5.4 10*3/uL (ref 4.0–10.5)
nRBC: 0 % (ref 0.0–0.2)

## 2023-05-02 LAB — IRON AND TIBC
Iron: 37 ug/dL — ABNORMAL LOW (ref 45–182)
Saturation Ratios: 14 % — ABNORMAL LOW (ref 17.9–39.5)
TIBC: 267 ug/dL (ref 250–450)
UIBC: 230 ug/dL

## 2023-05-02 LAB — GLUCOSE, CAPILLARY
Glucose-Capillary: 102 mg/dL — ABNORMAL HIGH (ref 70–99)
Glucose-Capillary: 67 mg/dL — ABNORMAL LOW (ref 70–99)
Glucose-Capillary: 102 mg/dL — ABNORMAL HIGH (ref 70–99)

## 2023-05-02 LAB — FERRITIN: Ferritin: 384 ng/mL — ABNORMAL HIGH (ref 24–336)

## 2023-05-02 MED ORDER — SODIUM CHLORIDE 0.9 % IV SOLN
100.0000 mg | Freq: Once | INTRAVENOUS | Status: AC
  Administered 2023-05-02: 100 mg via INTRAVENOUS
  Filled 2023-05-02: qty 5

## 2023-05-02 NOTE — TOC Progression Note (Signed)
Transition of Care [facility]) - Progression Note    Patient Details  Name: Steve Kelly MRN: [id number] Date of Birth: February 02, 1956  Transition of Care [facility]) CM/SW Contact  Hetty Ely, RN Phone Number: 05/02/2023, 9:27 AM  Clinical Narrative:   CM attempted to call daughter [name] to discuss Rehab bed offers no answer left voice message to return call.    Expected Discharge Plan: Skilled Nursing Facility    Expected Discharge Plan and Services       Living arrangements for the past 2 months: Single Family Home                                       Social Determinants of Health (SDOH) Interventions SDOH Screenings   Food Insecurity: No Food Insecurity (04/26/2023)  Housing: Low Risk  (04/28/2023)  Transportation Needs: No Transportation Needs (04/26/2023)  Utilities: Not At Risk (04/26/2023)  Tobacco Use: Low Risk  (04/23/2023)    Readmission Risk Interventions     No data to display

## 2023-05-02 NOTE — Plan of Care (Signed)

## 2023-05-02 NOTE — Progress Notes (Signed)
Progress Note    Steve Kelly  EXB:[id number] DOB: February 16, 1956  DOA: 04/23/2023 PCP: Ellis Parents, FNP      Brief Narrative:    Medical records reviewed and are as summarized below:  Steve Kelly is a 67 y.o. male with medical history significant of hypertension, GERD, alcohol use disorder, fatty liver, delirium tremens, who presented to the hospital with altered mental status.    Per report, pt was last known normal at about 2:00 p.m. Reportedly had seizure-like activity followed by unresponsiveness and aphasia. Family noticed a staring spell initially. Code stroke was called.  Stat CT head and MRI brain were negative for stroke.  CT cervical spine was also negative for any acute abnormality.  It was thought to be due to alcohol withdrawal with seizure and Dts. He was started on CIWA protocol.  He was also evaluated by PCCM but he was deemed not to require ICU admission at that time. He was tachycardic, hypertensive and tachypneic on presentation.  UDS was positive for cannabinoids and alcohol level was less than 10.   12/14: Overall stable vitals with mild tachycardia, slowly rising troponin, currently at 118 after showing some improvement.  Starting on heparin infusion. Labs with B12 of 193, lipid panel with LDL of 119, ammonia of 30, TSH 0.975, A1c of 5.3, slight worsening of leukocytosis at 11.7 Cardiology was consulted and echo ordered.  12/15: Patient became febrile with maximum temperature recorded of 101.5 overnight.  Labs with resolution of prior leukocytosis, troponin peaked at 118, CIWA score of 14, echocardiogram done with pending results Repeat chest x-ray with worsening right mid to lower zone airspace disease, patient with worsening cough, starting on ceftriaxone and Zithromax for concern of pneumonia.  Ordered blood and sputum culture, strep pneumo and Legionella antigen, RVP and procalcitonin-which was elevated at 4.05. Repeat UA with few ketones  only.  12/16: Afebrile this morning with maximum temperature recorded of 102.3 over the past 24-hour.  Leukocytosis resolved, developed hypoglycemia. preliminary blood cultures negative, strep pneumo, RVP and MRSA swab negative.  Patient more alert and able to take p.o. so starting on diet. Echocardiogram with normal EF but found to have severe aortic stenosis which need further evaluation with right and left cardiac catheterization and thoracic surgery evaluation once mental status recovers.      Assessment/Plan:   Principal Problem:   Metabolic encephalopathy Active Problems:   CAP (community acquired pneumonia)   Alcohol withdrawal (HCC)   Delirium tremens (HCC)   Hypertension   Myocardial injury   Leukocytosis   Aortic stenosis   Elevated troponin   Slurred speech    Body mass index is 20.57 kg/m.   Acute metabolic encephalopathy, resolved  Was thought to be due to? delirium tremens, alcohol intoxication, versus alcohol withdrawal seizures with suspected underlying dementia: CT and MRI brain negative for acute stroke.  He was evaluated by the neurologist.     Alcohol use disorder with alcohol withdrawal:  S/p phenobarbital taper Not requiring as needed benzodiazepines  Continue CIWA protocol without benzodiazepines, thiamine and multivitamin Decrease Librium to daily dosing for two days and then stop this   Community-acquired pneumonia: Resolved  No respiratory symptoms this a.m.  S/p Completed IV ceftriaxone and azithromycin 12/19   Acute nonischemic myocardial injury : ECG without evidence of acute ischemia. Troponin peaked at 110s. Likely due to elevated blood pressure or possible seizure like activity on admission related to his alcohol use.     Severe  aortic stenosis: 2D echo showed EF 55 to 60% and severe aortic stenosis.  Plan for right and left heart cath as an inpatient has been aborted.  Cardiologist recommended outpatient follow-up for further  evaluation.  Low blood sugar: Patient with a BG of 61 a few days ago and a blod glucose of 67. Asymptomatic though on coreg. He is not on any hypoglycemic agent. Synthetic liver function is normal. No evidence of liver injury. Cortisol level 11.5. ACTH stimulation test is normal.  -Continue to encourage PO intake.  -Defer further work up   Normocytic anemia     Latest Ref Rng & Units 05/02/2023    4:25 AM 05/01/2023    4:53 AM 04/27/2023    3:40 AM  CBC  WBC 4.0 - 10.5 K/uL 5.4  7.5  6.4   Hemoglobin 13.0 - 17.0 g/dL 34.7  42.5  95.6   Hematocrit 39.0 - 52.0 % 34.9  34.2  32.4   Platelets 150 - 400 K/uL 327  281  174   Stable.  Low normal vitamin B12 (193): Continue vitamin B12 supplement Normal ferritin but TSAT 14% -Will give one-time dose of IV iron, could be nutrition related in the setting of alcohol use -Will need outpatient colonoscopy   Leukocytosis: Resolved    Diet Order             DIET DYS 3 Room service appropriate? Yes with Assist; Fluid consistency: Thin  Diet effective now                   Consultants: Cardiologist Urologist  Procedures: None    Medications:    aspirin EC  81 mg Oral Daily   baclofen  10 mg Oral BID   carvedilol  6.25 mg Oral BID WC   chlordiazePOXIDE  10 mg Oral Daily   vitamin B-12  1,000 mcg Oral Daily   enoxaparin (LOVENOX) injection  40 mg Subcutaneous Q24H   folic acid  1 mg Oral Daily   multivitamin with minerals  1 tablet Oral Daily   polyethylene glycol  17 g Oral Daily   senna  1 tablet Oral Daily   thiamine  100 mg Oral Daily   Or   thiamine  100 mg Intravenous Daily   Continuous Infusions:     Anti-infectives (From admission, onward)    Start     Dose/Rate Route Frequency Ordered Stop   04/25/23 1400  azithromycin (ZITHROMAX) 500 mg in sodium chloride 0.9 % 250 mL IVPB        500 mg 250 mL/hr over 60 Minutes Intravenous Every 24 hours 04/25/23 1216 04/29/23 1929   04/25/23 1330  cefTRIAXone  (ROCEPHIN) 2 g in sodium chloride 0.9 % 100 mL IVPB  Status:  Discontinued        2 g 200 mL/hr over 30 Minutes Intravenous Every 24 hours 04/25/23 1216 04/29/23 1515              Family Communication/Anticipated D/C date and plan/Code Status   DVT prophylaxis: enoxaparin (LOVENOX) injection 40 mg Start: 04/25/23 1000     Code Status: Full Code  Family Communication: Plan discussed with [name], daughter, over the phone Disposition Plan: Plan to discharge to SNF   Status is: Inpatient Remains inpatient appropriate because: Encephalopathy       Subjective:   He has no complaints he is alert and oriented to person and place.  Able to carry conversation about current sports events.  Objective:  Vitals:   05/01/23 2353 05/02/23 0418 05/02/23 0720 05/02/23 1540  BP: 130/84 138/84 138/78 136/79  Pulse: 74 81 75 86  Resp: 16 18 16    Temp: 98.2 F (36.8 C) 98.5 F (36.9 C) 97.9 F (36.6 C) 98.9 F (37.2 C)  TempSrc: Oral     SpO2: 95% 96% 92% 95%  Weight:      Height:       No data found.   Intake/Output Summary (Last 24 hours) at 05/02/2023 1633 Last data filed at 05/02/2023 1027 Gross per 24 hour  Intake 150 ml  Output 1151 ml  Net -1001 ml   Filed Weights   04/23/23 2128 04/24/23 0700 04/26/23 0646  Weight: 64 kg 64 kg 63.2 kg   Physical Exam  Constitutional: In no distress.  Cardiovascular: Normal rate, regular rhythm. No lower extremity edema  Pulmonary: Non labored breathing on room air, no wheezing or rales.  Abdominal: Soft. Normal bowel sounds. Non distended and non tender Musculoskeletal: Normal range of motion.     Neurological: Alert and oriented to person, place. Non focal  Skin: Skin is warm and dry.        Data Reviewed:   I have personally reviewed following labs and imaging studies:  Labs: Labs show the following:   Basic Metabolic Panel: Recent Labs  Lab 04/27/23 0340 04/28/23 0333 04/29/23 0539 05/01/23 0453   NA 134* 135 135 135  K 3.1* 3.5 3.8 4.1  CL 101 100 100 100  CO2 24 25 24 24   GLUCOSE 95 94 89 97  BUN 7* 6* 8 12  CREATININE 0.66 0.71 0.89 0.75  CALCIUM 8.8* 9.1 9.3 9.5  MG 1.7 2.1  --  1.9  PHOS 3.8  --   --  3.8   GFR Estimated Creatinine Clearance: 82.3 mL/min (by C-G formula based on SCr of 0.75 mg/dL). Liver Function Tests: Recent Labs  Lab 05/01/23 0453  AST 23  ALT 16  ALKPHOS 52  BILITOT 0.5  PROT 7.6  ALBUMIN 3.2*   No results for input(s): "LIPASE", "AMYLASE" in the last 168 hours. No results for input(s): "AMMONIA" in the last 168 hours.  Coagulation profile No results for input(s): "INR", "PROTIME" in the last 168 hours.   CBC: Recent Labs  Lab 04/26/23 0501 04/27/23 0340 05/01/23 0453 05/02/23 0425  WBC 7.3 6.4 7.5 5.4  HGB 12.1* 11.4* 12.0* 12.1*  HCT 35.0* 32.4* 34.2* 34.9*  MCV 95.1 93.4 92.2 92.3  PLT 155 174 281 327   Cardiac Enzymes: No results for input(s): "CKTOTAL", "CKMB", "CKMBINDEX", "TROPONINI" in the last 168 hours. BNP (last 3 results) No results for input(s): "PROBNP" in the last 8760 hours. CBG: Recent Labs  Lab 04/30/23 0755 05/01/23 0755 05/02/23 0649 05/02/23 0716 05/02/23 0734  GLUCAP 106* 81 67* 102* 102*   D-Dimer: No results for input(s): "DDIMER" in the last 72 hours. Hgb A1c: No results for input(s): "HGBA1C" in the last 72 hours. Lipid Profile: No results for input(s): "CHOL", "HDL", "LDLCALC", "TRIG", "CHOLHDL", "LDLDIRECT" in the last 72 hours. Thyroid function studies: No results for input(s): "TSH", "T4TOTAL", "T3FREE", "THYROIDAB" in the last 72 hours.  Invalid input(s): "FREET3" Anemia work up: Recent Labs    05/02/23 0425  FERRITIN 384*  TIBC 267  IRON 37*   Sepsis Labs: Recent Labs  Lab 04/26/23 0501 04/27/23 0340 05/01/23 0453 05/02/23 0425  WBC 7.3 6.4 7.5 5.4    Microbiology Recent Results (from the past 240 hours)  MRSA  Next Gen by PCR, Nasal     Status: None   Collection  Time: 04/25/23  9:39 AM   Specimen: Nasal Mucosa; Nasal Swab  Result Value Ref Range Status   MRSA by PCR Next Gen NOT DETECTED NOT DETECTED Final    Comment: (NOTE) The GeneXpert MRSA Assay (FDA approved for NASAL specimens only), is one component of a comprehensive MRSA colonization surveillance program. It is not intended to diagnose MRSA infection nor to guide or monitor treatment for MRSA infections. Test performance is not FDA approved in patients less than 14 years old. Performed at [facility], [address] [place], [place] [address]   Respiratory (~20 pathogens) panel by PCR     Status: None   Collection Time: 04/25/23 12:40 PM   Specimen: Nasopharyngeal Swab; Respiratory  Result Value Ref Range Status   Adenovirus NOT DETECTED NOT DETECTED Final   Coronavirus 229E NOT DETECTED NOT DETECTED Final    Comment: (NOTE) The Coronavirus on the Respiratory Panel, DOES NOT test for the novel  Coronavirus (2019 nCoV)    Coronavirus HKU1 NOT DETECTED NOT DETECTED Final   Coronavirus NL63 NOT DETECTED NOT DETECTED Final   Coronavirus OC43 NOT DETECTED NOT DETECTED Final   Metapneumovirus NOT DETECTED NOT DETECTED Final   Rhinovirus / Enterovirus NOT DETECTED NOT DETECTED Final   Influenza A NOT DETECTED NOT DETECTED Final   Influenza B NOT DETECTED NOT DETECTED Final   Parainfluenza Virus 1 NOT DETECTED NOT DETECTED Final   Parainfluenza Virus 2 NOT DETECTED NOT DETECTED Final   Parainfluenza Virus 3 NOT DETECTED NOT DETECTED Final   Parainfluenza Virus 4 NOT DETECTED NOT DETECTED Final   Respiratory Syncytial Virus NOT DETECTED NOT DETECTED Final   Bordetella pertussis NOT DETECTED NOT DETECTED Final   Bordetella Parapertussis NOT DETECTED NOT DETECTED Final   Chlamydophila pneumoniae NOT DETECTED NOT DETECTED Final   Mycoplasma pneumoniae NOT DETECTED NOT DETECTED Final    Comment: Performed at [facility], [address], [place], [place] [address]   Culture, blood (Routine X 2) w Reflex to ID Panel     Status: None   Collection Time: 04/25/23  1:31 PM   Specimen: BLOOD LEFT HAND  Result Value Ref Range Status   Specimen Description BLOOD LEFT HAND  Final   Special Requests   Final    BOTTLES DRAWN AEROBIC ONLY Blood Culture results may not be optimal due to an inadequate volume of blood received in culture bottles   Culture   Final    NO GROWTH 5 DAYS Performed at [facility], [address] [place] [address], [place], [place] [address]    Report Status 04/30/2023 FINAL  Final  Culture, blood (Routine X 2) w Reflex to ID Panel     Status: None   Collection Time: 04/25/23  1:31 PM   Specimen: BLOOD RIGHT HAND  Result Value Ref Range Status   Specimen Description BLOOD RIGHT HAND  Final   Special Requests   Final    BOTTLES DRAWN AEROBIC AND ANAEROBIC Blood Culture adequate volume   Culture   Final    NO GROWTH 5 DAYS Performed at [facility], [address] [place], [place] [address]    Report Status 04/30/2023 FINAL  Final    Procedures and diagnostic studies:  No results found.              LOS: 9 days   Marolyn Haller MD   [facility]  Pager on [email or web address]. If 7PM-7AM, please contact night-coverage at [email or web address]     05/02/2023, 4:33 PM

## 2023-05-03 ENCOUNTER — Inpatient Hospital Stay: Payer: Medicare Other

## 2023-05-03 LAB — GLUCOSE, CAPILLARY
Glucose-Capillary: 124 mg/dL — ABNORMAL HIGH (ref 70–99)
Glucose-Capillary: 81 mg/dL (ref 70–99)

## 2023-05-03 NOTE — TOC Progression Note (Signed)
Transition of Care [facility]) - Progression Note    Patient Details  Name: Steve Kelly MRN: [id number] Date of Birth: [birth year]  Transition of Care [facility] CM/SW Contact  Allena Katz, LCSW Phone Number: 05/03/2023, 9:23 AM  Clinical Narrative:   CSW spoke with son who states he was able to speak with his dad and his dad has agreed to LTC. CSW shared bed offers. Son is good with either. CSW will follow up with both facilities to ensure they have a LTC bed.    Expected Discharge Plan: Skilled Nursing Facility    Expected Discharge Plan and Services       Living arrangements for the past 2 months: Single Family Home                                       Social Determinants of Health (SDOH) Interventions SDOH Screenings   Food Insecurity: No Food Insecurity (04/26/2023)  Housing: Low Risk  (04/28/2023)  Transportation Needs: No Transportation Needs (04/26/2023)  Utilities: Not At Risk (04/26/2023)  Tobacco Use: Low Risk  (04/23/2023)    Readmission Risk Interventions     No data to display

## 2023-05-03 NOTE — Plan of Care (Signed)
  Problem: Skin Integrity: Goal: Risk for impaired skin integrity will decrease 05/03/2023 1557 by Danella Sensing, RN Outcome: Progressing 05/03/2023 1557 by Danella Sensing, RN Outcome: Progressing 05/03/2023 1247 by Danella Sensing, RN Outcome: Progressing   Problem: Safety: Goal: Ability to remain free from injury will improve 05/03/2023 1557 by Danella Sensing, RN Outcome: Progressing 05/03/2023 1557 by Danella Sensing, RN Outcome: Progressing 05/03/2023 1247 by Danella Sensing, RN Outcome: Progressing   Problem: Pain Management: Goal: General experience of comfort will improve 05/03/2023 1557 by Danella Sensing, RN Outcome: Progressing 05/03/2023 1557 by Danella Sensing, RN Outcome: Progressing 05/03/2023 1247 by Danella Sensing, RN Outcome: Progressing   Problem: Elimination: Goal: Will not experience complications related to bowel motility 05/03/2023 1557 by Danella Sensing, RN Outcome: Progressing 05/03/2023 1557 by Danella Sensing, RN Outcome: Progressing 05/03/2023 1247 by Danella Sensing, RN Outcome: Progressing Goal: Will not experience complications related to urinary retention 05/03/2023 1557 by Danella Sensing, RN Outcome: Progressing 05/03/2023 1557 by Danella Sensing, RN Outcome: Progressing 05/03/2023 1247 by Danella Sensing, RN Outcome: Progressing   Problem: Nutrition: Goal: Adequate nutrition will be maintained 05/03/2023 1557 by Danella Sensing, RN Outcome: Progressing 05/03/2023 1557 by Danella Sensing, RN Outcome: Progressing 05/03/2023 1247 by Danella Sensing, RN Outcome: Progressing

## 2023-05-03 NOTE — Progress Notes (Signed)
Progress Note    Steve Kelly  MVH:[id number] DOB: May 06, 1956  DOA: 04/23/2023 PCP: Ellis Parents, FNP      Brief Narrative:    Medical records reviewed and are as summarized below:  Steve Kelly is a 67 y.o. male with medical history significant of hypertension, GERD, alcohol use disorder, fatty liver, delirium tremens, who presented to the hospital with altered mental status.    Per report, pt was last known normal at about 2:00 p.m. Reportedly had seizure-like activity followed by unresponsiveness and aphasia. Family noticed a staring spell initially. Code stroke was called.  Stat CT head and MRI brain were negative for stroke.  CT cervical spine was also negative for any acute abnormality.  It was thought to be due to alcohol withdrawal with seizure and Dts. He was started on CIWA protocol.  He was also evaluated by PCCM but he was deemed not to require ICU admission at that time. He was tachycardic, hypertensive and tachypneic on presentation.  UDS was positive for cannabinoids and alcohol level was less than 10.   12/14: Overall stable vitals with mild tachycardia, slowly rising troponin, currently at 118 after showing some improvement.  Starting on heparin infusion. Labs with B12 of 193, lipid panel with LDL of 119, ammonia of 30, TSH 0.975, A1c of 5.3, slight worsening of leukocytosis at 11.7 Cardiology was consulted and echo ordered.  12/15: Patient became febrile with maximum temperature recorded of 101.5 overnight.  Labs with resolution of prior leukocytosis, troponin peaked at 118, CIWA score of 14, echocardiogram done with pending results Repeat chest x-ray with worsening right mid to lower zone airspace disease, patient with worsening cough, starting on ceftriaxone and Zithromax for concern of pneumonia.  Ordered blood and sputum culture, strep pneumo and Legionella antigen, RVP and procalcitonin-which was elevated at 4.05. Repeat UA with few ketones  only.  12/16: Afebrile this morning with maximum temperature recorded of 102.3 over the past 24-hour.  Leukocytosis resolved, developed hypoglycemia. preliminary blood cultures negative, strep pneumo, RVP and MRSA swab negative.  Patient more alert and able to take p.o. so starting on diet. Echocardiogram with normal EF but found to have severe aortic stenosis which need further evaluation with right and left cardiac catheterization and thoracic surgery evaluation once mental status recovers.      Assessment/Plan:   Principal Problem:   Metabolic encephalopathy Active Problems:   CAP (community acquired pneumonia)   Alcohol withdrawal (HCC)   Delirium tremens (HCC)   Hypertension   Myocardial injury   Leukocytosis   Aortic stenosis   Elevated troponin   Slurred speech    Body mass index is 20.57 kg/m.   Acute metabolic encephalopathy, resolved  Was thought to be due to? delirium tremens, alcohol intoxication, versus alcohol withdrawal seizures with suspected underlying dementia: CT and MRI brain negative for acute stroke.  He was evaluated by the neurologist.     Alcohol use disorder with alcohol withdrawal:  S/p phenobarbital taper Not requiring as needed benzodiazepines  Continue CIWA protocol without benzodiazepines, thiamine and multivitamin Decrease Librium to daily dosing for two days and then stop this   Community-acquired pneumonia: Resolved  No respiratory symptoms this a.m.  S/p Completed IV ceftriaxone and azithromycin 12/19   Acute nonischemic myocardial injury : ECG without evidence of acute ischemia. Troponin peaked at 110s. Likely due to elevated blood pressure or possible seizure like activity on admission related to his alcohol use.     Severe  aortic stenosis: 2D echo showed EF 55 to 60% and severe aortic stenosis.  Plan for right and left heart cath as an inpatient has been aborted.  Cardiologist recommended outpatient follow-up for further  evaluation.  Low blood sugar: Patient with a BG of 61 a few days ago and a blod glucose of 67. Asymptomatic though on coreg. He is not on any hypoglycemic agent. Synthetic liver function is normal. No evidence of liver injury. Cortisol level 11.5. ACTH stimulation test is normal.  -Continue to encourage PO intake.  -Defer further work up   Normocytic anemia     Latest Ref Rng & Units 05/02/2023    4:25 AM 05/01/2023    4:53 AM 04/27/2023    3:40 AM  CBC  WBC 4.0 - 10.5 K/uL 5.4  7.5  6.4   Hemoglobin 13.0 - 17.0 g/dL 95.2  84.1  32.4   Hematocrit 39.0 - 52.0 % 34.9  34.2  32.4   Platelets 150 - 400 K/uL 327  281  174   Stable.  Low normal vitamin B12 (193): Continue vitamin B12 supplement Normal ferritin but TSAT 14% -Will give one-time dose of IV iron, could be nutrition related in the setting of alcohol use -Will need outpatient colonoscopy   Leukocytosis: Resolved    Diet Order             DIET DYS 3 Room service appropriate? Yes with Assist; Fluid consistency: Thin  Diet effective now                   Consultants: Cardiologist Urologist  Procedures: None    Medications:    aspirin EC  81 mg Oral Daily   baclofen  10 mg Oral BID   carvedilol  6.25 mg Oral BID WC   vitamin B-12  1,000 mcg Oral Daily   enoxaparin (LOVENOX) injection  40 mg Subcutaneous Q24H   folic acid  1 mg Oral Daily   multivitamin with minerals  1 tablet Oral Daily   polyethylene glycol  17 g Oral Daily   senna  1 tablet Oral Daily   thiamine  100 mg Oral Daily   Or   thiamine  100 mg Intravenous Daily   Continuous Infusions:     Anti-infectives (From admission, onward)    Start     Dose/Rate Route Frequency Ordered Stop   04/25/23 1400  azithromycin (ZITHROMAX) 500 mg in sodium chloride 0.9 % 250 mL IVPB        500 mg 250 mL/hr over 60 Minutes Intravenous Every 24 hours 04/25/23 1216 04/29/23 1929   04/25/23 1330  cefTRIAXone (ROCEPHIN) 2 g in sodium chloride 0.9 % 100  mL IVPB  Status:  Discontinued        2 g 200 mL/hr over 30 Minutes Intravenous Every 24 hours 04/25/23 1216 04/29/23 1515              Family Communication/Anticipated D/C date and plan/Code Status   DVT prophylaxis: enoxaparin (LOVENOX) injection 40 mg Start: 04/25/23 1000     Code Status: Full Code  Family Communication: Plan discussed with [name], daughter, over the phone Disposition Plan: Plan to discharge to SNF   Status is: Inpatient Remains inpatient appropriate because: Encephalopathy       Subjective:   He has no complaints this AM. Awaiting his son to pick nursing facility.   Objective:    Vitals:   05/03/23 0450 05/03/23 0834 05/03/23 1439 05/03/23 1700  BP: 108/79 133/74  101/83 103/68  Pulse: 85 81 (!) 103   Resp: 18 16 17    Temp: 98.7 F (37.1 C) 98.3 F (36.8 C) 97.6 F (36.4 C)   TempSrc: Oral     SpO2: 95% 96% 98%   Weight:      Height:       No data found.   Intake/Output Summary (Last 24 hours) at 05/03/2023 1936 Last data filed at 05/03/2023 7846 Gross per 24 hour  Intake 50 ml  Output 400 ml  Net -350 ml   Filed Weights   04/23/23 2128 04/24/23 0700 04/26/23 0646  Weight: 64 kg 64 kg 63.2 kg    Physical Exam  Constitutional: In no distress.  Cardiovascular: Normal rate, regular rhythm. No lower extremity edema  Pulmonary: Non labored breathing on room air, no wheezing or rales.   Abdominal: Soft. Normal bowel sounds. Non distended and non tender Musculoskeletal: Normal range of motion.     Neurological: Alert and oriented to person, place, and time. Non focal  Skin: Skin is warm and dry.     Data Reviewed:   I have personally reviewed following labs and imaging studies:  Labs: Labs show the following:   Basic Metabolic Panel: Recent Labs  Lab 04/27/23 0340 04/28/23 0333 04/29/23 0539 05/01/23 0453  NA 134* 135 135 135  K 3.1* 3.5 3.8 4.1  CL 101 100 100 100  CO2 24 25 24 24   GLUCOSE 95 94 89 97   BUN 7* 6* 8 12  CREATININE 0.66 0.71 0.89 0.75  CALCIUM 8.8* 9.1 9.3 9.5  MG 1.7 2.1  --  1.9  PHOS 3.8  --   --  3.8   GFR Estimated Creatinine Clearance: 82.3 mL/min (by C-G formula based on SCr of 0.75 mg/dL). Liver Function Tests: Recent Labs  Lab 05/01/23 0453  AST 23  ALT 16  ALKPHOS 52  BILITOT 0.5  PROT 7.6  ALBUMIN 3.2*   No results for input(s): "LIPASE", "AMYLASE" in the last 168 hours. No results for input(s): "AMMONIA" in the last 168 hours.  Coagulation profile No results for input(s): "INR", "PROTIME" in the last 168 hours.   CBC: Recent Labs  Lab 04/27/23 0340 05/01/23 0453 05/02/23 0425  WBC 6.4 7.5 5.4  HGB 11.4* 12.0* 12.1*  HCT 32.4* 34.2* 34.9*  MCV 93.4 92.2 92.3  PLT 174 281 327   Cardiac Enzymes: No results for input(s): "CKTOTAL", "CKMB", "CKMBINDEX", "TROPONINI" in the last 168 hours. BNP (last 3 results) No results for input(s): "PROBNP" in the last 8760 hours. CBG: Recent Labs  Lab 05/01/23 0755 05/02/23 0649 05/02/23 0716 05/02/23 0734 05/03/23 0834  GLUCAP 81 67* 102* 102* 81   D-Dimer: No results for input(s): "DDIMER" in the last 72 hours. Hgb A1c: No results for input(s): "HGBA1C" in the last 72 hours. Lipid Profile: No results for input(s): "CHOL", "HDL", "LDLCALC", "TRIG", "CHOLHDL", "LDLDIRECT" in the last 72 hours. Thyroid function studies: No results for input(s): "TSH", "T4TOTAL", "T3FREE", "THYROIDAB" in the last 72 hours.  Invalid input(s): "FREET3" Anemia work up: Recent Labs    05/02/23 0425  FERRITIN 384*  TIBC 267  IRON 37*   Sepsis Labs: Recent Labs  Lab 04/27/23 0340 05/01/23 0453 05/02/23 0425  WBC 6.4 7.5 5.4    Microbiology Recent Results (from the past 240 hours)  MRSA Next Gen by PCR, Nasal     Status: None   Collection Time: 04/25/23  9:39 AM   Specimen: Nasal Mucosa; Nasal Swab  Result Value Ref Range Status   MRSA by PCR Next Gen NOT DETECTED NOT DETECTED Final    Comment:  (NOTE) The GeneXpert MRSA Assay (FDA approved for NASAL specimens only), is one component of a comprehensive MRSA colonization surveillance program. It is not intended to diagnose MRSA infection nor to guide or monitor treatment for MRSA infections. Test performance is not FDA approved in patients less than 36 years old. Performed at [facility], [address] [place], [place] [address]   Respiratory (~20 pathogens) panel by PCR     Status: None   Collection Time: 04/25/23 12:40 PM   Specimen: Nasopharyngeal Swab; Respiratory  Result Value Ref Range Status   Adenovirus NOT DETECTED NOT DETECTED Final   Coronavirus 229E NOT DETECTED NOT DETECTED Final    Comment: (NOTE) The Coronavirus on the Respiratory Panel, DOES NOT test for the novel  Coronavirus (2019 nCoV)    Coronavirus HKU1 NOT DETECTED NOT DETECTED Final   Coronavirus NL63 NOT DETECTED NOT DETECTED Final   Coronavirus OC43 NOT DETECTED NOT DETECTED Final   Metapneumovirus NOT DETECTED NOT DETECTED Final   Rhinovirus / Enterovirus NOT DETECTED NOT DETECTED Final   Influenza A NOT DETECTED NOT DETECTED Final   Influenza B NOT DETECTED NOT DETECTED Final   Parainfluenza Virus 1 NOT DETECTED NOT DETECTED Final   Parainfluenza Virus 2 NOT DETECTED NOT DETECTED Final   Parainfluenza Virus 3 NOT DETECTED NOT DETECTED Final   Parainfluenza Virus 4 NOT DETECTED NOT DETECTED Final   Respiratory Syncytial Virus NOT DETECTED NOT DETECTED Final   Bordetella pertussis NOT DETECTED NOT DETECTED Final   Bordetella Parapertussis NOT DETECTED NOT DETECTED Final   Chlamydophila pneumoniae NOT DETECTED NOT DETECTED Final   Mycoplasma pneumoniae NOT DETECTED NOT DETECTED Final    Comment: Performed at [facility], [address], [place], [place] [address]  Culture, blood (Routine X 2) w Reflex to ID Panel     Status: None   Collection Time: 04/25/23  1:31 PM   Specimen: BLOOD LEFT HAND  Result Value Ref Range Status    Specimen Description BLOOD LEFT HAND  Final   Special Requests   Final    BOTTLES DRAWN AEROBIC ONLY Blood Culture results may not be optimal due to an inadequate volume of blood received in culture bottles   Culture   Final    NO GROWTH 5 DAYS Performed at [facility], [address] [place], [place] [address]    Report Status 04/30/2023 FINAL  Final  Culture, blood (Routine X 2) w Reflex to ID Panel     Status: None   Collection Time: 04/25/23  1:31 PM   Specimen: BLOOD RIGHT HAND  Result Value Ref Range Status   Specimen Description BLOOD RIGHT HAND  Final   Special Requests   Final    BOTTLES DRAWN AEROBIC AND ANAEROBIC Blood Culture adequate volume   Culture   Final    NO GROWTH 5 DAYS Performed at [facility], [address], [place], [place] [address]    Report Status 04/30/2023 FINAL  Final    Procedures and diagnostic studies:  No results found.              LOS: 10 days   Marolyn Haller MD   [facility]   Pager on [email or web address]. If 7PM-7AM, please contact night-coverage at [email or web address]     05/03/2023, 7:36 PM

## 2023-05-03 NOTE — TOC Progression Note (Signed)
Transition of Care [facility]) - Progression Note    Patient Details  Name: Steve Kelly MRN: [id number] Date of Birth: 1956-04-21  Transition of Care [facility]) CM/SW Contact  Allena Katz, LCSW Phone Number: 05/03/2023, 12:03 PM  Clinical Narrative:   [facility] can take pt for LTC. CSW went to discuss this with pt but PT working with him. CSW will follow back up.     Expected Discharge Plan: Skilled Nursing Facility    Expected Discharge Plan and Services       Living arrangements for the past 2 months: Single Family Home                                       Social Determinants of Health (SDOH) Interventions SDOH Screenings   Food Insecurity: No Food Insecurity (04/26/2023)  Housing: Low Risk  (04/28/2023)  Transportation Needs: No Transportation Needs (04/26/2023)  Utilities: Not At Risk (04/26/2023)  Tobacco Use: Low Risk  (04/23/2023)    Readmission Risk Interventions     No data to display

## 2023-05-03 NOTE — Progress Notes (Signed)
Occupational Therapy Treatment Patient Details Name: Steve Kelly MRN: [id number] DOB: 05/20/1955 Today's Date: 05/03/2023   History of present illness 67 y.o. male to the ED with altered mental status. +alcohol withdrawal; AKI; ?seizure 11/03; head CT no acute changes;   PMH significant of alcohol use disorder, HTN and vitamin D deficiency   OT comments  Pt is supine in bed on arrival. Easily arousable and agreeable to PT/OT session to maximize pt/therapist safety. He denies pain initially, then with mobility reports mild soreness to his L calf region. Pt performed bed mobility with CGA and cues for hand placement on rails and cues to scoot forward. Pt required Min A x2 with bed height elevated for STS to RW, then ambulated to the bathroom and back using RW with Min A provided to buckling from LLE soreness. Needed Mod A for toilet transfer with BSC over toilet and Mod A for thoroughness with peri-care after BM. Pt easily distracted and forward flexed posture at times requiring cueing for upright position. Did not endorse fatigue at end of session. He sat in recliner with all needs in place and will cont to require skilled acute OT services to maximize his safety and IND to return to PLOF.       If plan is discharge home, recommend the following:  A lot of help with walking and/or transfers;A lot of help with bathing/dressing/bathroom;Supervision due to cognitive status;Help with stairs or ramp for entrance   Equipment Recommendations  BSC/3in1    Recommendations for Other Services      Precautions / Restrictions Precautions Precautions: Fall Restrictions Weight Bearing Restrictions Per Provider Order: No       Mobility Bed Mobility Overal bed mobility: Needs Assistance Bed Mobility: Supine to Sit, Sit to Supine     Supine to sit: Contact guard, Used rails     General bed mobility comments: CGA for supine to sit at EOB with cueing for hand placement on rails; cueing to scoot  foward to EOB    Transfers Overall transfer level: Needs assistance Equipment used: Rolling walker (2 wheels) Transfers: Sit to/from Stand Sit to Stand: From elevated surface, Min assist, +2 physical assistance           General transfer comment: Min A x2 for STS from EOB with it elevated to RW; then ambulated with Min A in room using RW d/t occasional buckling     Balance Overall balance assessment: Needs assistance, History of Falls Sitting-balance support: Feet supported Sitting balance-Leahy Scale: Good     Standing balance support: Bilateral upper extremity supported, During functional activity, Reliant on assistive device for balance Standing balance-Leahy Scale: Fair Standing balance comment: needed Min A during mobility d/t buckling and inattentiveness                           ADL either performed or assessed with clinical judgement   ADL Overall ADL's : Needs assistance/impaired                         Toilet Transfer: Moderate assistance;Regular Toilet;BSC/3in1;Grab bars;Rolling walker (2 wheels)   Toileting- Clothing Manipulation and Hygiene: Moderate assistance;Sit to/from stand Toileting - Clothing Manipulation Details (indicate cue type and reason): pt performed hygiene in standing with Min/MOD A for balance, then therapist provided assist for thoroughness     Functional mobility during ADLs: Minimal assistance;Rolling walker (2 wheels)      Extremity/Trunk Assessment  Vision       Perception     Praxis      Cognition Arousal: Alert Behavior During Therapy: Impulsive Overall Cognitive Status: Impaired/Different from baseline Area of Impairment: Orientation, Safety/judgement, Awareness, Problem solving                         Safety/Judgement: Decreased awareness of deficits, Decreased awareness of safety   Problem Solving: Requires verbal cues General Comments: easily distracted, cueing to stay  on task--cueing for initiation        Exercises      Shoulder Instructions       General Comments      Pertinent Vitals/ Pain       Pain Assessment Pain Assessment: Faces Faces Pain Scale: Hurts a little bit Pain Location: L calf region Pain Descriptors / Indicators: Sore Pain Intervention(s): Monitored during session  Home Living                                          Prior Functioning/Environment              Frequency  Min 1X/week        Progress Toward Goals  OT Goals(current goals can now be found in the care plan section)  Progress towards OT goals: Progressing toward goals  Acute Rehab OT Goals Patient Stated Goal: improve strength OT Goal Formulation: With patient Time For Goal Achievement: 05/12/23 Potential to Achieve Goals: Good  Plan      Co-evaluation    PT/OT/SLP Co-Evaluation/Treatment: Yes Reason for Co-Treatment: For patient/therapist safety;Necessary to address cognition/behavior during functional activity;To address functional/ADL transfers PT goals addressed during session: Mobility/safety with mobility;Balance;Proper use of DME OT goals addressed during session: ADL's and self-care      AM-PAC OT "6 Clicks" Daily Activity     Outcome Measure   Help from another person eating meals?: None Help from another person taking care of personal grooming?: A Little Help from another person toileting, which includes using toliet, bedpan, or urinal?: A Lot Help from another person bathing (including washing, rinsing, drying)?: A Lot Help from another person to put on and taking off regular upper body clothing?: A Little Help from another person to put on and taking off regular lower body clothing?: A Lot 6 Click Score: 16    End of Session Equipment Utilized During Treatment: Gait belt;Rolling walker (2 wheels)  OT Visit Diagnosis: Other abnormalities of gait and mobility (R26.89);Muscle weakness (generalized)  (M62.81)   Activity Tolerance Patient tolerated treatment well   Patient Left with call bell/phone within reach;in chair;with chair alarm set   Nurse Communication          Time: [phone] OT Time Calculation (min): 28 min  Charges: OT General Charges $OT Visit: 1 Visit OT Treatments $Self Care/Home Management : 8-22 mins  Hudsen Fei, OTR/L  05/03/23, 1:31 PM   Trista Ciocca E Macayla Ekdahl 05/03/2023, 1:29 PM

## 2023-05-03 NOTE — Plan of Care (Signed)

## 2023-05-03 NOTE — Progress Notes (Signed)
Physical Therapy Treatment Patient Details Name: Steve Kelly MRN: [id number] DOB: Sep 20, 1955 Today's Date: 05/03/2023   History of Present Illness 67 y.o. male to the ED with altered mental status. +alcohol withdrawal; AKI; ?seizure 11/03; head CT no acute changes;   PMH significant of alcohol use disorder, HTN and vitamin D deficiency    PT Comments  Pt received in bed for co-tx with OT for pt and therapist safety due to previous increased physical assist. Pt appears to be functionally improving, able to tolerate gait training with RW in room 77ft x 2 with CGA primarily. One occasion of LOB while changing direction requiring MinA to prevent LOB. Minimal c/o fatigue, very cooperative, no SOB during session. Pt c/o soreness L lower leg without any visual signs of trauma yet did cause 2 occasions of buckling during gait. Pt left up in chair with all needs met. Continue per POC.   If plan is discharge home, recommend the following: A lot of help with bathing/dressing/bathroom;Assist for transportation;Help with stairs or ramp for entrance;A little help with walking and/or transfers;A little help with bathing/dressing/bathroom   Can travel by private vehicle     Yes  Equipment Recommendations  Other (comment) (TBD at next level of care)    Recommendations for Other Services       Precautions / Restrictions Precautions Precautions: Fall Restrictions Weight Bearing Restrictions Per Provider Order: No     Mobility  Bed Mobility Overal bed mobility: Needs Assistance Bed Mobility: Supine to Sit, Sit to Supine     Supine to sit: Contact guard, Used rails     General bed mobility comments: CGA for supine to sit at EOB with cueing for hand placement on rails; cueing to scoot foward to EOB    Transfers Overall transfer level: Needs assistance Equipment used: Rolling walker (2 wheels) Transfers: Sit to/from Stand Sit to Stand: From elevated surface, Min assist, +2 physical  assistance           General transfer comment: Min A x2 for STS from EOB with it elevated to RW, cues for technique    Ambulation/Gait Ambulation/Gait assistance: Min assist Gait Distance (Feet): 20 Feet Assistive device: Rolling walker (2 wheels) Gait Pattern/deviations: Step-to pattern, Knees buckling, Trunk flexed Gait velocity: decr     General Gait Details: ? previous injury to L lower leg due to pt with c/o soreness and one occasion of buckling requiring MinA for balance. No noteable scaring or skin changes to indicate trauma.   Stairs             Wheelchair Mobility     Tilt Bed    Modified Rankin (Stroke Patients Only)       Balance                                            Cognition Arousal: Alert Behavior During Therapy: Impulsive Overall Cognitive Status: Impaired/Different from baseline Area of Impairment: Orientation, Safety/judgement, Awareness, Problem solving                         Safety/Judgement: Decreased awareness of deficits, Decreased awareness of safety   Problem Solving: Requires verbal cues General Comments: easily distracted, cueing to stay on task--cueing for initiation        Exercises Other Exercises Other Exercises: Pt educated on role of PT and POC.  General Comments General comments (skin integrity, edema, etc.): Pt required cues on safe transfers and fall prevention, tends to lean forward during gait and standing tasks.      Pertinent Vitals/Pain Pain Assessment Pain Assessment: Faces Faces Pain Scale: Hurts a little bit Pain Location: L Lower Leg Pain Descriptors / Indicators: Sore Pain Intervention(s): Monitored during session    Home Living                          Prior Function            PT Goals (current goals can now be found in the care plan section) Acute Rehab PT Goals Patient Stated Goal: to return to son's home    Frequency    Min  1X/week      PT Plan      Co-evaluation PT/OT/SLP Co-Evaluation/Treatment: Yes Reason for Co-Treatment: For patient/therapist safety;Necessary to address cognition/behavior during functional activity;To address functional/ADL transfers PT goals addressed during session: Mobility/safety with mobility;Balance;Proper use of DME OT goals addressed during session: ADL's and self-care      AM-PAC PT "6 Clicks" Mobility   Outcome Measure  Help needed turning from your back to your side while in a flat bed without using bedrails?: A Little Help needed moving from lying on your back to sitting on the side of a flat bed without using bedrails?: A Little Help needed moving to and from a bed to a chair (including a wheelchair)?: A Little Help needed standing up from a chair using your arms (e.g., wheelchair or bedside chair)?: A Little Help needed to walk in hospital room?: A Lot Help needed climbing 3-5 steps with a railing? : A Lot 6 Click Score: 16    End of Session Equipment Utilized During Treatment: Gait belt Activity Tolerance: Patient tolerated treatment well Patient left: in chair;with call bell/phone within reach;with chair alarm set Nurse Communication: Mobility status PT Visit Diagnosis: Other abnormalities of gait and mobility (R26.89);Unsteadiness on feet (R26.81);Muscle weakness (generalized) (M62.81);Difficulty in walking, not elsewhere classified (R26.2);History of falling (Z91.81)     Time: 1137-1205 PT Time Calculation (min) (ACUTE ONLY): 28 min  Charges:    $Therapeutic Activity: 8-22 mins PT General Charges $$ ACUTE PT VISIT: 1 Visit                    Zadie Cleverly, PTA  Jannet Askew 05/03/2023, 1:38 PM

## 2023-05-03 NOTE — Care Management Important Message (Signed)
Important Message  Patient Details  Name: Steve Kelly MRN: [id number] Date of Birth: Dec 21, 1955   Important Message Given:  Yes - Medicare IM     Bernadette Hoit 05/03/2023, 11:02 AM

## 2023-05-03 NOTE — Progress Notes (Addendum)
Pt assisted to [facility] from chair. Family present at bedside. Call bell placed in reach. Per family they stepped out of the room to provide privacy for patient, staff unaware. Patient attempted to clean himself, leaned over too far and sustained a fall from [facility]. BP, WDL. No injuries noted. MD notified. CT scan ordered.   Incident reported  in [facility] Portal.

## 2023-05-03 NOTE — Plan of Care (Signed)
  Problem: Pain Management: Goal: General experience of comfort will improve Outcome: Progressing   Problem: Safety: Goal: Ability to remain free from injury will improve Outcome: Progressing   Problem: Skin Integrity: Goal: Risk for impaired skin integrity will decrease Outcome: Progressing

## 2023-05-04 DIAGNOSIS — G9341 Metabolic encephalopathy: Secondary | ICD-10-CM | POA: Diagnosis not present

## 2023-05-04 LAB — GLUCOSE, CAPILLARY: Glucose-Capillary: 84 mg/dL (ref 70–99)

## 2023-05-04 NOTE — TOC Progression Note (Signed)
Transition of Care [facility]) - Progression Note    Patient Details  Name: Steve Kelly MRN: [id number] Date of Birth: 07-May-1956  Transition of Care [facility]) CM/SW Contact  Allena Katz, LCSW Phone Number: 05/04/2023, 9:32 AM  Clinical Narrative:   Tanya checking bed days for patient because he has likely used all of his days up as he just discharged from [facility]. If pt does not do LTC, he would have to pay for the copays. Son is coming to see patient to discuss this with him.    Expected Discharge Plan: Skilled Nursing Facility    Expected Discharge Plan and Services       Living arrangements for the past 2 months: Single Family Home                                       Social Determinants of Health (SDOH) Interventions SDOH Screenings   Food Insecurity: No Food Insecurity (04/26/2023)  Housing: Low Risk  (04/28/2023)  Transportation Needs: No Transportation Needs (04/26/2023)  Utilities: Not At Risk (04/26/2023)  Tobacco Use: Low Risk  (04/23/2023)    Readmission Risk Interventions     No data to display

## 2023-05-04 NOTE — Progress Notes (Signed)
Progress Note    Steve Kelly  NWG:[id number] DOB: 01-22-1956  DOA: 04/23/2023 PCP: Ellis Parents, FNP      Brief Narrative:    Medical records reviewed and are as summarized below:  Steve Kelly is a 67 y.o. male with medical history significant of hypertension, GERD, alcohol use disorder, fatty liver, delirium tremens, who presented to the hospital with altered mental status.    Per report, pt was last known normal at about 2:00 p.m. Reportedly had seizure-like activity followed by unresponsiveness and aphasia. Family noticed a staring spell initially. Code stroke was called.  Stat CT head and MRI brain were negative for stroke.  CT cervical spine was also negative for any acute abnormality.  It was thought to be due to alcohol withdrawal with seizure and Dts. He was started on CIWA protocol.  He was also evaluated by PCCM but he was deemed not to require ICU admission at that time. He was tachycardic, hypertensive and tachypneic on presentation.  UDS was positive for cannabinoids and alcohol level was less than 10.   12/14: Overall stable vitals with mild tachycardia, slowly rising troponin, currently at 118 after showing some improvement.  Starting on heparin infusion. Labs with B12 of 193, lipid panel with LDL of 119, ammonia of 30, TSH 0.975, A1c of 5.3, slight worsening of leukocytosis at 11.7 Cardiology was consulted and echo ordered.  12/15: Patient became febrile with maximum temperature recorded of 101.5 overnight.  Labs with resolution of prior leukocytosis, troponin peaked at 118, CIWA score of 14, echocardiogram done with pending results Repeat chest x-ray with worsening right mid to lower zone airspace disease, patient with worsening cough, starting on ceftriaxone and Zithromax for concern of pneumonia.  Ordered blood and sputum culture, strep pneumo and Legionella antigen, RVP and procalcitonin-which was elevated at 4.05. Repeat UA with few ketones  only.  12/16: Afebrile this morning with maximum temperature recorded of 102.3 over the past 24-hour.  Leukocytosis resolved, developed hypoglycemia. preliminary blood cultures negative, strep pneumo, RVP and MRSA swab negative.  Patient more alert and able to take p.o. so starting on diet. Echocardiogram with normal EF but found to have severe aortic stenosis which need further evaluation with right and left cardiac catheterization and thoracic surgery evaluation once mental status recovers.      Assessment/Plan:   Principal Problem:   Metabolic encephalopathy Active Problems:   CAP (community acquired pneumonia)   Alcohol withdrawal (HCC)   Delirium tremens (HCC)   Hypertension   Myocardial injury   Leukocytosis   Aortic stenosis   Elevated troponin   Slurred speech    Body mass index is 20.57 kg/m.   Acute metabolic encephalopathy, resolved  Was thought to be due to? delirium tremens, alcohol intoxication, versus alcohol withdrawal seizures with suspected underlying dementia: CT and MRI brain negative for acute stroke.  He was evaluated by the neurologist.   --Delirium precautions  Alcohol use disorder with alcohol withdrawal:  S/p phenobarbital taper Not requiring as needed benzodiazepines  Now off CIWA protocol Continue thiamine and multivitamin Completed Librium taper  Community-acquired pneumonia: Resolved  No respiratory symptoms this a.m.  S/p Completed IV ceftriaxone and azithromycin 12/19  Acute nonischemic myocardial injury : ECG without evidence of acute ischemia. Troponin peaked at 110s. Likely due to elevated blood pressure or possible seizure like activity on admission related to his alcohol use.    Severe aortic stenosis: 2D echo showed EF 55 to 60% and severe  aortic stenosis.   Plan for right and left heart cath as an inpatient now deferred.   --Cardiology recommended outpatient follow-up for further evaluation.  Hypoglycemia glucose 67 on 12/22.   No recurrence, closely monitor. He is not on any hypoglycemic agent. Synthetic liver function is normal. No evidence of liver injury. Cortisol level 11.5. ACTH stimulation test is normal.  --Continue to encourage PO intake --Hypoglycemia protocol  Normocytic anemia  Low normal vitamin B12 (193): Continue vitamin B12 supplement Normal ferritin but TSAT 14% Given IV iron Possibly nutrition-related in the setting of alcohol use --Recommend outpatient colonoscopy  --Monitor CBC  Leukocytosis: Resolved    Diet Order             DIET DYS 3 Room service appropriate? Yes with Assist; Fluid consistency: Thin  Diet effective now                   Consultants: Cardiology Urology  Procedures: None    Medications:    aspirin EC  81 mg Oral Daily   baclofen  10 mg Oral BID   carvedilol  6.25 mg Oral BID WC   vitamin B-12  1,000 mcg Oral Daily   enoxaparin (LOVENOX) injection  40 mg Subcutaneous Q24H   folic acid  1 mg Oral Daily   multivitamin with minerals  1 tablet Oral Daily   polyethylene glycol  17 g Oral Daily   senna  1 tablet Oral Daily   thiamine  100 mg Oral Daily   Or   thiamine  100 mg Intravenous Daily   Continuous Infusions:     Anti-infectives (From admission, onward)    Start     Dose/Rate Route Frequency Ordered Stop   04/25/23 1400  azithromycin (ZITHROMAX) 500 mg in sodium chloride 0.9 % 250 mL IVPB        500 mg 250 mL/hr over 60 Minutes Intravenous Every 24 hours 04/25/23 1216 04/29/23 1929   04/25/23 1330  cefTRIAXone (ROCEPHIN) 2 g in sodium chloride 0.9 % 100 mL IVPB  Status:  Discontinued        2 g 200 mL/hr over 30 Minutes Intravenous Every 24 hours 04/25/23 1216 04/29/23 1515              Family Communication/Anticipated D/C date and plan/Code Status   DVT prophylaxis: enoxaparin (LOVENOX) injection 40 mg Start: 04/25/23 1000     Code Status: Full Code  Family Communication: Pt's brother at bedside on rounds this  AM  Disposition Plan: SNF   Status is: Inpatient Remains inpatient appropriate because: Awaiting SNF placement, hypoglycemic episodes due to poor po intake   Subjective:   Pt a\wake resting in bed, brother at bedside.  Pt denies complaints, but is withdrawn and minimally interactive.  He will shake or nod head yes/no but does not speak otherwise.  No acute events reported.   Objective:    Vitals:   05/03/23 1700 05/03/23 2036 05/04/23 0416 05/04/23 0725  BP: 103/68 111/79 115/74 130/87  Pulse:  85 91 80  Resp:  18  16  Temp:  99.1 F (37.3 C) 98.6 F (37 C) 98.1 F (36.7 C)  TempSrc:  Oral    SpO2:  95% 96% 99%  Weight:      Height:       No data found.   Intake/Output Summary (Last 24 hours) at 05/04/2023 1124 Last data filed at 05/04/2023 0600 Gross per 24 hour  Intake 240 ml  Output 350 ml  Net -110 ml   Filed Weights   04/23/23 2128 04/24/23 0700 04/26/23 0646  Weight: 64 kg 64 kg 63.2 kg    Physical Exam  General exam: awake, alert, no acute distress, minimally interactive but responds appropriately HEENT: moist mucus membranes, hearing grossly normal  Respiratory system: CTAB, no wheezes, rales or rhonchi, normal respiratory effort. Cardiovascular system: normal S1/S2, RRR, no JVD, murmurs, rubs, gallops, no pedal edema.   Gastrointestinal system: soft, NT, ND, no HSM felt, +bowel sounds. Central nervous system: limited exam due to poor participation from pt. no gross focal neurologic deficits, non-verbal Extremities: no edema, normal tone Skin: dry, intact, normal temperature Psychiatry: normal mood, congruent affect, judgement and insight appear normal     Data Reviewed:   I have personally reviewed following labs and imaging studies:  Labs: Labs show the following:   Basic Metabolic Panel: Recent Labs  Lab 04/28/23 0333 04/29/23 0539 05/01/23 0453  NA 135 135 135  K 3.5 3.8 4.1  CL 100 100 100  CO2 25 24 24   GLUCOSE 94 89 97  BUN  6* 8 12  CREATININE 0.71 0.89 0.75  CALCIUM 9.1 9.3 9.5  MG 2.1  --  1.9  PHOS  --   --  3.8   GFR Estimated Creatinine Clearance: 82.3 mL/min (by C-G formula based on SCr of 0.75 mg/dL). Liver Function Tests: Recent Labs  Lab 05/01/23 0453  AST 23  ALT 16  ALKPHOS 52  BILITOT 0.5  PROT 7.6  ALBUMIN 3.2*   No results for input(s): "LIPASE", "AMYLASE" in the last 168 hours. No results for input(s): "AMMONIA" in the last 168 hours.  Coagulation profile No results for input(s): "INR", "PROTIME" in the last 168 hours.   CBC: Recent Labs  Lab 05/01/23 0453 05/02/23 0425  WBC 7.5 5.4  HGB 12.0* 12.1*  HCT 34.2* 34.9*  MCV 92.2 92.3  PLT 281 327   Cardiac Enzymes: No results for input(s): "CKTOTAL", "CKMB", "CKMBINDEX", "TROPONINI" in the last 168 hours. BNP (last 3 results) No results for input(s): "PROBNP" in the last 8760 hours. CBG: Recent Labs  Lab 05/02/23 0649 05/02/23 0716 05/02/23 0734 05/03/23 0834 05/04/23 0726  GLUCAP 67* 102* 102* 81 84   D-Dimer: No results for input(s): "DDIMER" in the last 72 hours. Hgb A1c: No results for input(s): "HGBA1C" in the last 72 hours. Lipid Profile: No results for input(s): "CHOL", "HDL", "LDLCALC", "TRIG", "CHOLHDL", "LDLDIRECT" in the last 72 hours. Thyroid function studies: No results for input(s): "TSH", "T4TOTAL", "T3FREE", "THYROIDAB" in the last 72 hours.  Invalid input(s): "FREET3" Anemia work up: Recent Labs    05/02/23 0425  FERRITIN 384*  TIBC 267  IRON 37*   Sepsis Labs: Recent Labs  Lab 05/01/23 0453 05/02/23 0425  WBC 7.5 5.4    Microbiology Recent Results (from the past 240 hours)  MRSA Next Gen by PCR, Nasal     Status: None   Collection Time: 04/25/23  9:39 AM   Specimen: Nasal Mucosa; Nasal Swab  Result Value Ref Range Status   MRSA by PCR Next Gen NOT DETECTED NOT DETECTED Final    Comment: (NOTE) The GeneXpert MRSA Assay (FDA approved for NASAL specimens only), is one  component of a comprehensive MRSA colonization surveillance program. It is not intended to diagnose MRSA infection nor to guide or monitor treatment for MRSA infections. Test performance is not FDA approved in patients less than 74 years old. Performed at [facility], [address] [place], [place] [address]   Respiratory (~20 pathogens) panel by PCR     Status: None   Collection Time: 04/25/23 12:40 PM   Specimen: Nasopharyngeal Swab; Respiratory  Result Value Ref Range Status   Adenovirus NOT DETECTED NOT DETECTED Final   Coronavirus 229E NOT DETECTED NOT DETECTED Final    Comment: (NOTE) The Coronavirus on the Respiratory Panel, DOES NOT test for the novel  Coronavirus (2019 nCoV)    Coronavirus HKU1 NOT DETECTED NOT DETECTED Final   Coronavirus NL63 NOT DETECTED NOT DETECTED Final   Coronavirus OC43 NOT DETECTED NOT DETECTED Final   Metapneumovirus NOT DETECTED NOT DETECTED Final   Rhinovirus / Enterovirus NOT DETECTED NOT DETECTED Final   Influenza A NOT DETECTED NOT DETECTED Final   Influenza B NOT DETECTED NOT DETECTED Final   Parainfluenza Virus 1 NOT DETECTED NOT DETECTED Final   Parainfluenza Virus 2 NOT DETECTED NOT DETECTED Final   Parainfluenza Virus 3 NOT DETECTED NOT DETECTED Final   Parainfluenza Virus 4 NOT DETECTED NOT DETECTED Final   Respiratory Syncytial Virus NOT DETECTED NOT DETECTED Final   Bordetella pertussis NOT DETECTED NOT DETECTED Final   Bordetella Parapertussis NOT DETECTED NOT DETECTED Final   Chlamydophila pneumoniae NOT DETECTED NOT DETECTED Final   Mycoplasma pneumoniae NOT DETECTED NOT DETECTED Final    Comment: Performed at [facility], [address], [place], [place] [address]  Culture, blood (Routine X 2) w Reflex to ID Panel     Status: None   Collection Time: 04/25/23  1:31 PM   Specimen: BLOOD LEFT HAND  Result Value Ref Range Status   Specimen Description BLOOD LEFT HAND  Final   Special Requests   Final     BOTTLES DRAWN AEROBIC ONLY Blood Culture results may not be optimal due to an inadequate volume of blood received in culture bottles   Culture   Final    NO GROWTH 5 DAYS Performed at [facility], [address] [place], [place] [address]    Report Status 04/30/2023 FINAL  Final  Culture, blood (Routine X 2) w Reflex to ID Panel     Status: None   Collection Time: 04/25/23  1:31 PM   Specimen: BLOOD RIGHT HAND  Result Value Ref Range Status   Specimen Description BLOOD RIGHT HAND  Final   Special Requests   Final    BOTTLES DRAWN AEROBIC AND ANAEROBIC Blood Culture adequate volume   Culture   Final    NO GROWTH 5 DAYS Performed at [facility], [address], [place], [place] [address]    Report Status 04/30/2023 FINAL  Final    Procedures and diagnostic studies:  CT HEAD WO CONTRAST ( ) Result Date: 05/03/2023 CLINICAL DATA:  Fall from toilet, hit head EXAM: CT HEAD WITHOUT CONTRAST TECHNIQUE: Contiguous axial images were obtained from the base of the skull through the vertex without intravenous contrast. RADIATION DOSE REDUCTION: This exam was performed according to the departmental dose-optimization program which includes automated exposure control, adjustment of the mA and/or kV according to patient size and/or use of iterative reconstruction technique. COMPARISON:  04/23/2023 FINDINGS: Brain: No evidence of acute infarction, hemorrhage, mass, mass effect, or midline shift. No hydrocephalus or extra-axial fluid collection. Periventricular white matter changes, likely the sequela of chronic small vessel ischemic disease. Advanced cerebral volume loss for age. Vascular: No hyperdense vessel. Atherosclerotic calcifications in the intracranial carotid and vertebral arteries. Skull: Negative for fracture or focal lesion. Sinuses/Orbits: Right maxillary  mucous retention cysts. Otherwise clear paranasal sinuses. No acute finding in the orbits. Other: The mastoid air  cells are well aerated. IMPRESSION: No acute intracranial process. Electronically Signed   By: Wiliam Ke M.D.   On: 05/03/2023 20:23                LOS: 11 days   Pennie Banter DO [facility]   Pager on [email or web address]. If 7PM-7AM, please contact night-coverage at [email or web address]     05/04/2023, 11:24 AM

## 2023-05-04 NOTE — TOC Progression Note (Signed)
Transition of Care [facility]) - Progression Note    Patient Details  Name: Steve Kelly MRN: [id number] Date of Birth: [birth year]  [facility]) CM/SW Contact  Allena Katz, LCSW Phone Number: 05/04/2023, 9:25 AM  Clinical Narrative:   CSW met with pt at bedside. Pt states that he does not want to do LTC. He wants to go to rehab but wants to return with his son after rehab.     Expected Discharge Plan: Skilled Nursing Facility    Expected Discharge Plan and Services       Living arrangements for the past 2 months: Single Family Home                                       Social Determinants of Health (SDOH) Interventions SDOH Screenings   Food Insecurity: No Food Insecurity (04/26/2023)  Housing: Low Risk  (04/28/2023)  Transportation Needs: No Transportation Needs (04/26/2023)  Utilities: Not At Risk (04/26/2023)  Tobacco Use: Low Risk  (04/23/2023)    Readmission Risk Interventions     No data to display

## 2023-05-05 DIAGNOSIS — G9341 Metabolic encephalopathy: Secondary | ICD-10-CM | POA: Diagnosis not present

## 2023-05-05 LAB — BASIC METABOLIC PANEL
Anion gap: 10 (ref 5–15)
BUN: 13 mg/dL (ref 8–23)
CO2: 26 mmol/L (ref 22–32)
Calcium: 9.4 mg/dL (ref 8.9–10.3)
Chloride: 99 mmol/L (ref 98–111)
Creatinine, Ser: 0.79 mg/dL (ref 0.61–1.24)
GFR, Estimated: >60 mL/min (ref >60)
Glucose, Bld: 95 mg/dL (ref 70–99)
Potassium: 3.7 mmol/L (ref 3.5–5.1)
Sodium: 135 mmol/L (ref 135–145)

## 2023-05-05 NOTE — Plan of Care (Signed)
  Problem: Education: Goal: Knowledge of General Education information will improve Description Including pain rating scale, medication(s)/side effects and non-pharmacologic comfort measures Outcome: Progressing   Problem: Health Behavior/Discharge Planning: Goal: Ability to manage health-related needs will improve Outcome: Progressing   

## 2023-05-05 NOTE — Progress Notes (Signed)
Progress Note    Steve Kelly  ZOX:[id number] DOB: March 01, 1956  DOA: 04/23/2023 PCP: Ellis Parents, FNP      Brief Narrative:    Medical records reviewed and are as summarized below:  Steve Kelly is a 67 y.o. male with medical history significant of hypertension, GERD, alcohol use disorder, fatty liver, delirium tremens, who presented to the hospital with altered mental status.    Per report, pt was last known normal at about 2:00 p.m. Reportedly had seizure-like activity followed by unresponsiveness and aphasia. Family noticed a staring spell initially. Code stroke was called.  Stat CT head and MRI brain were negative for stroke.  CT cervical spine was also negative for any acute abnormality.  It was thought to be due to alcohol withdrawal with seizure and Dts. He was started on CIWA protocol.  He was also evaluated by PCCM but he was deemed not to require ICU admission at that time. He was tachycardic, hypertensive and tachypneic on presentation.  UDS was positive for cannabinoids and alcohol level was less than 10.   12/14: Overall stable vitals with mild tachycardia, slowly rising troponin, currently at 118 after showing some improvement.  Starting on heparin infusion. Labs with B12 of 193, lipid panel with LDL of 119, ammonia of 30, TSH 0.975, A1c of 5.3, slight worsening of leukocytosis at 11.7 Cardiology was consulted and echo ordered.  12/15: Patient became febrile with maximum temperature recorded of 101.5 overnight.  Labs with resolution of prior leukocytosis, troponin peaked at 118, CIWA score of 14, echocardiogram done with pending results Repeat chest x-ray with worsening right mid to lower zone airspace disease, patient with worsening cough, starting on ceftriaxone and Zithromax for concern of pneumonia.  Ordered blood and sputum culture, strep pneumo and Legionella antigen, RVP and procalcitonin-which was elevated at 4.05. Repeat UA with few ketones  only.  12/16: Afebrile this morning with maximum temperature recorded of 102.3 over the past 24-hour.  Leukocytosis resolved, developed hypoglycemia. preliminary blood cultures negative, strep pneumo, RVP and MRSA swab negative.  Patient more alert and able to take p.o. so starting on diet. Echocardiogram with normal EF but found to have severe aortic stenosis which need further evaluation with right and left cardiac catheterization and thoracic surgery evaluation once mental status recovers.      Assessment/Plan:   Principal Problem:   Metabolic encephalopathy Active Problems:   CAP (community acquired pneumonia)   Alcohol withdrawal (HCC)   Delirium tremens (HCC)   Hypertension   Myocardial injury   Leukocytosis   Aortic stenosis   Elevated troponin   Slurred speech    Body mass index is 20.57 kg/m.   Acute metabolic encephalopathy, resolved  Was thought to be due to? delirium tremens, alcohol intoxication, versus alcohol withdrawal seizures with suspected underlying dementia: CT and MRI brain negative for acute stroke.  He was evaluated by the neurologist.   --Delirium precautions --Mgmt of underlying issues as outlined  Alcohol use disorder with alcohol withdrawal:  S/p phenobarbital taper --Now off CIWA protocol, lo longer needing PRN benzodiazepines  --Continue thiamine and multivitamin --Completed Librium taper  Community-acquired pneumonia: Resolved  No respiratory symptoms this a.m.  S/p Completed IV ceftriaxone and azithromycin 12/19  Acute nonischemic myocardial injury : ECG without evidence of acute ischemia. Troponin peaked at 110s. Likely due to elevated blood pressure or possible seizure like activity on admission related to his alcohol use.    Severe aortic stenosis: 2D echo showed  EF 55 to 60% and severe aortic stenosis.   Plan for right and left heart cath as an inpatient now deferred.   --Cardiology recommended outpatient follow-up for further  evaluation.  Hypoglycemia glucose 67 on 12/22.  No recurrence, closely monitor. He is not on any hypoglycemic agent. Synthetic liver function is normal. No evidence of liver injury. Cortisol level 11.5. ACTH stimulation test is normal.  --Continue to encourage PO intake --Hypoglycemia protocol  Normocytic anemia  Low normal vitamin B12 (193): Continue vitamin B12 supplement Normal ferritin but TSAT 14% Given IV iron Possibly nutrition-related in the setting of alcohol use --Recommend outpatient colonoscopy  --Monitor CBC  Leukocytosis: Resolved    Diet Order             DIET DYS 3 Room service appropriate? Yes with Assist; Fluid consistency: Thin  Diet effective now                   Consultants: Cardiology Urology  Procedures: None    Medications:    aspirin EC  81 mg Oral Daily   baclofen  10 mg Oral BID   carvedilol  6.25 mg Oral BID WC   vitamin B-12  1,000 mcg Oral Daily   enoxaparin (LOVENOX) injection  40 mg Subcutaneous Q24H   folic acid  1 mg Oral Daily   multivitamin with minerals  1 tablet Oral Daily   polyethylene glycol  17 g Oral Daily   senna  1 tablet Oral Daily   thiamine  100 mg Oral Daily   Or   thiamine  100 mg Intravenous Daily   Continuous Infusions:     Anti-infectives (From admission, onward)    Start     Dose/Rate Route Frequency Ordered Stop   04/25/23 1400  azithromycin (ZITHROMAX) 500 mg in sodium chloride 0.9 % 250 mL IVPB        500 mg 250 mL/hr over 60 Minutes Intravenous Every 24 hours 04/25/23 1216 04/29/23 1929   04/25/23 1330  cefTRIAXone (ROCEPHIN) 2 g in sodium chloride 0.9 % 100 mL IVPB  Status:  Discontinued        2 g 200 mL/hr over 30 Minutes Intravenous Every 24 hours 04/25/23 1216 04/29/23 1515              Family Communication/Anticipated D/C date and plan/Code Status   DVT prophylaxis: enoxaparin (LOVENOX) injection 40 mg Start: 04/25/23 1000     Code Status: Full Code  Family  Communication: Pt's brother at bedside on rounds 12/24.  Disposition Plan: SNF   Status is: Inpatient Remains inpatient appropriate because: Awaiting SNF placement   Subjective:   Pt awake resting in bed this AM.  He reports feeling okay, denies acute complaints.  Just feels weak in general.  Would prefer to go home instead of SNF.   Objective:    Vitals:   05/04/23 1658 05/04/23 2019 05/05/23 0540 05/05/23 0816  BP: (!) 126/97 116/72 128/75 (!) 148/75  Pulse: 89 87 80 72  Resp: 16 18  15   Temp: 98.7 F (37.1 C) 98.6 F (37 C) 99 F (37.2 C) 98.1 F (36.7 C)  TempSrc:      SpO2: 97% 94% 98% 97%  Weight:      Height:       No data found.   Intake/Output Summary (Last 24 hours) at 05/05/2023 1214 Last data filed at 05/05/2023 0600 Gross per 24 hour  Intake 240 ml  Output 550 ml  Net -310  ml   Filed Weights   04/23/23 2128 04/24/23 0700 04/26/23 0646  Weight: 64 kg 64 kg 63.2 kg    Physical Exam  General exam: awake, alert, no acute distress, more interactive today HEENT: moist mucus membranes, hearing grossly normal  Respiratory system: lungs clear no wheezes or rhonchi, on room air, normal respiratory effort. Cardiovascular system: normal S1/S2, RRR, no peripheral edema Gastrointestinal system: soft, NT, ND Central nervous system: A&O, grossly non-focal exam Extremities: no edema, normal tone Skin: dry, intact, normal temperature Psychiatry: normal mood, congruent affect     Data Reviewed:   I have personally reviewed following labs and imaging studies:  Labs: Labs show the following:   Basic Metabolic Panel: Recent Labs  Lab 04/29/23 0539 05/01/23 0453 05/05/23 0547  NA 135 135 135  K 3.8 4.1 3.7  CL 100 100 99  CO2 24 24 26   GLUCOSE 89 97 95  BUN 8 12 13   CREATININE 0.89 0.75 0.79  CALCIUM 9.3 9.5 9.4  MG  --  1.9  --   PHOS  --  3.8  --    GFR Estimated Creatinine Clearance: 82.3 mL/min (by C-G formula based on SCr of 0.79  mg/dL). Liver Function Tests: Recent Labs  Lab 05/01/23 0453  AST 23  ALT 16  ALKPHOS 52  BILITOT 0.5  PROT 7.6  ALBUMIN 3.2*   No results for input(s): "LIPASE", "AMYLASE" in the last 168 hours. No results for input(s): "AMMONIA" in the last 168 hours.  Coagulation profile No results for input(s): "INR", "PROTIME" in the last 168 hours.   CBC: Recent Labs  Lab 05/01/23 0453 05/02/23 0425  WBC 7.5 5.4  HGB 12.0* 12.1*  HCT 34.2* 34.9*  MCV 92.2 92.3  PLT 281 327   Cardiac Enzymes: No results for input(s): "CKTOTAL", "CKMB", "CKMBINDEX", "TROPONINI" in the last 168 hours. BNP (last 3 results) No results for input(s): "PROBNP" in the last 8760 hours. CBG: Recent Labs  Lab 05/02/23 0649 05/02/23 0716 05/02/23 0734 05/03/23 0834 05/04/23 0726  GLUCAP 67* 102* 102* 81 84   D-Dimer: No results for input(s): "DDIMER" in the last 72 hours. Hgb A1c: No results for input(s): "HGBA1C" in the last 72 hours. Lipid Profile: No results for input(s): "CHOL", "HDL", "LDLCALC", "TRIG", "CHOLHDL", "LDLDIRECT" in the last 72 hours. Thyroid function studies: No results for input(s): "TSH", "T4TOTAL", "T3FREE", "THYROIDAB" in the last 72 hours.  Invalid input(s): "FREET3" Anemia work up: No results for input(s): "VITAMINB12", "FOLATE", "FERRITIN", "TIBC", "IRON", "RETICCTPCT" in the last 72 hours.  Sepsis Labs: Recent Labs  Lab 05/01/23 0453 05/02/23 0425  WBC 7.5 5.4    Microbiology Recent Results (from the past 240 hours)  Respiratory (~20 pathogens) panel by PCR     Status: None   Collection Time: 04/25/23 12:40 PM   Specimen: Nasopharyngeal Swab; Respiratory  Result Value Ref Range Status   Adenovirus NOT DETECTED NOT DETECTED Final   Coronavirus 229E NOT DETECTED NOT DETECTED Final    Comment: (NOTE) The Coronavirus on the Respiratory Panel, DOES NOT test for the novel  Coronavirus (2019 nCoV)    Coronavirus HKU1 NOT DETECTED NOT DETECTED Final    Coronavirus NL63 NOT DETECTED NOT DETECTED Final   Coronavirus OC43 NOT DETECTED NOT DETECTED Final   Metapneumovirus NOT DETECTED NOT DETECTED Final   Rhinovirus / Enterovirus NOT DETECTED NOT DETECTED Final   Influenza A NOT DETECTED NOT DETECTED Final   Influenza B NOT DETECTED NOT DETECTED Final   Parainfluenza  Virus 1 NOT DETECTED NOT DETECTED Final   Parainfluenza Virus 2 NOT DETECTED NOT DETECTED Final   Parainfluenza Virus 3 NOT DETECTED NOT DETECTED Final   Parainfluenza Virus 4 NOT DETECTED NOT DETECTED Final   Respiratory Syncytial Virus NOT DETECTED NOT DETECTED Final   Bordetella pertussis NOT DETECTED NOT DETECTED Final   Bordetella Parapertussis NOT DETECTED NOT DETECTED Final   Chlamydophila pneumoniae NOT DETECTED NOT DETECTED Final   Mycoplasma pneumoniae NOT DETECTED NOT DETECTED Final    Comment: Performed at [facility], [address] [place], [place] [address]  Culture, blood (Routine X 2) w Reflex to ID Panel     Status: None   Collection Time: 04/25/23  1:31 PM   Specimen: BLOOD LEFT HAND  Result Value Ref Range Status   Specimen Description BLOOD LEFT HAND  Final   Special Requests   Final    BOTTLES DRAWN AEROBIC ONLY Blood Culture results may not be optimal due to an inadequate volume of blood received in culture bottles   Culture   Final    NO GROWTH 5 DAYS Performed at [facility], [address] [place], [place] [address]    Report Status 04/30/2023 FINAL  Final  Culture, blood (Routine X 2) w Reflex to ID Panel     Status: None   Collection Time: 04/25/23  1:31 PM   Specimen: BLOOD RIGHT HAND  Result Value Ref Range Status   Specimen Description BLOOD RIGHT HAND  Final   Special Requests   Final    BOTTLES DRAWN AEROBIC AND ANAEROBIC Blood Culture adequate volume   Culture   Final    NO GROWTH 5 DAYS Performed at [facility], [address] [place], [place] [address]    Report Status 04/30/2023 FINAL  Final     Procedures and diagnostic studies:  CT HEAD WO CONTRAST ( ) Result Date: 05/03/2023 CLINICAL DATA:  Fall from toilet, hit head EXAM: CT HEAD WITHOUT CONTRAST TECHNIQUE: Contiguous axial images were obtained from the base of the skull through the vertex without intravenous contrast. RADIATION DOSE REDUCTION: This exam was performed according to the departmental dose-optimization program which includes automated exposure control, adjustment of the mA and/or kV according to patient size and/or use of iterative reconstruction technique. COMPARISON:  04/23/2023 FINDINGS: Brain: No evidence of acute infarction, hemorrhage, mass, mass effect, or midline shift. No hydrocephalus or extra-axial fluid collection. Periventricular white matter changes, likely the sequela of chronic small vessel ischemic disease. Advanced cerebral volume loss for age. Vascular: No hyperdense vessel. Atherosclerotic calcifications in the intracranial carotid and vertebral arteries. Skull: Negative for fracture or focal lesion. Sinuses/Orbits: Right maxillary mucous retention cysts. Otherwise clear paranasal sinuses. No acute finding in the orbits. Other: The mastoid air cells are well aerated. IMPRESSION: No acute intracranial process. Electronically Signed   By: Wiliam Ke M.D.   On: 05/03/2023 20:23                LOS: 12 days   Pennie Banter DO [facility]   Pager on [email or web address]. If 7PM-7AM, please contact night-coverage at [email or web address]     05/05/2023, 12:14 PM

## 2023-05-05 NOTE — Plan of Care (Signed)

## 2023-05-06 DIAGNOSIS — G9341 Metabolic encephalopathy: Secondary | ICD-10-CM | POA: Diagnosis not present

## 2023-05-06 LAB — GLUCOSE, CAPILLARY
Glucose-Capillary: 79 mg/dL (ref 70–99)
Glucose-Capillary: 107 mg/dL — ABNORMAL HIGH (ref 70–99)
Glucose-Capillary: 82 mg/dL (ref 70–99)

## 2023-05-06 NOTE — Plan of Care (Signed)

## 2023-05-06 NOTE — Plan of Care (Addendum)
Patient is alert and oriented. Denies any pain.  Problem: Education: Goal: Knowledge of General Education information will improve Description: Including pain rating scale, medication(s)/side effects and non-pharmacologic comfort measures Outcome: Progressing   Problem: Health Behavior/Discharge Planning: Goal: Ability to manage health-related needs will improve Outcome: Progressing   Problem: Clinical Measurements: Goal: Ability to maintain clinical measurements within normal limits will improve Outcome: Progressing Goal: Will remain free from infection Outcome: Progressing Goal: Diagnostic test results will improve Outcome: Progressing Goal: Respiratory complications will improve Outcome: Progressing Goal: Cardiovascular complication will be avoided Outcome: Progressing   Problem: Activity: Goal: Risk for activity intolerance will decrease Outcome: Progressing   Problem: Nutrition: Goal: Adequate nutrition will be maintained Outcome: Progressing   Problem: Coping: Goal: Level of anxiety will decrease Outcome: Progressing   Problem: Elimination: Goal: Will not experience complications related to bowel motility Outcome: Progressing Goal: Will not experience complications related to urinary retention Outcome: Progressing   Problem: Pain Management: Goal: General experience of comfort will improve Outcome: Progressing   Problem: Safety: Goal: Ability to remain free from injury will improve Outcome: Progressing   Problem: Skin Integrity: Goal: Risk for impaired skin integrity will decrease Outcome: Progressing

## 2023-05-06 NOTE — Progress Notes (Signed)
Progress Note    Steve Kelly  DGU:[id number] DOB: 02/23/1956  DOA: 04/23/2023 PCP: Ellis Parents, FNP      Brief Narrative:    Medical records reviewed and are as summarized below:  Steve Kelly is a 67 y.o. male with medical history significant of hypertension, GERD, alcohol use disorder, fatty liver, delirium tremens, who presented to the hospital with altered mental status.    Per report, pt was last known normal at about 2:00 p.m. Reportedly had seizure-like activity followed by unresponsiveness and aphasia. Family noticed a staring spell initially. Code stroke was called.  Stat CT head and MRI brain were negative for stroke.  CT cervical spine was also negative for any acute abnormality.  It was thought to be due to alcohol withdrawal with seizure and Dts. He was started on CIWA protocol.  He was also evaluated by PCCM but he was deemed not to require ICU admission at that time. He was tachycardic, hypertensive and tachypneic on presentation.  UDS was positive for cannabinoids and alcohol level was less than 10.   12/14: Overall stable vitals with mild tachycardia, slowly rising troponin, currently at 118 after showing some improvement.  Starting on heparin infusion. Labs with B12 of 193, lipid panel with LDL of 119, ammonia of 30, TSH 0.975, A1c of 5.3, slight worsening of leukocytosis at 11.7 Cardiology was consulted and echo ordered.  12/15: Patient became febrile with maximum temperature recorded of 101.5 overnight.  Labs with resolution of prior leukocytosis, troponin peaked at 118, CIWA score of 14, echocardiogram done with pending results Repeat chest x-ray with worsening right mid to lower zone airspace disease, patient with worsening cough, starting on ceftriaxone and Zithromax for concern of pneumonia.  Ordered blood and sputum culture, strep pneumo and Legionella antigen, RVP and procalcitonin-which was elevated at 4.05. Repeat UA with few ketones  only.  12/16: Afebrile this morning with maximum temperature recorded of 102.3 over the past 24-hour.  Leukocytosis resolved, developed hypoglycemia. preliminary blood cultures negative, strep pneumo, RVP and MRSA swab negative.  Patient more alert and able to take p.o. so starting on diet. Echocardiogram with normal EF but found to have severe aortic stenosis which need further evaluation with right and left cardiac catheterization and thoracic surgery evaluation once mental status recovers.      Assessment/Plan:   Principal Problem:   Metabolic encephalopathy Active Problems:   CAP (community acquired pneumonia)   Alcohol withdrawal (HCC)   Delirium tremens (HCC)   Hypertension   Myocardial injury   Leukocytosis   Aortic stenosis   Elevated troponin   Slurred speech    Body mass index is 20.57 kg/m.   Acute metabolic encephalopathy, resolved  Was thought to be due to? delirium tremens, alcohol intoxication, versus alcohol withdrawal seizures with suspected underlying dementia: CT and MRI brain negative for acute stroke.  He was evaluated by the neurologist.   --Delirium precautions --Mgmt of underlying issues as outlined  Alcohol use disorder with alcohol withdrawal:  S/p phenobarbital taper --Now off CIWA protocol, lo longer needing PRN benzodiazepines  --Continue thiamine and multivitamin --Completed Librium taper  Community-acquired pneumonia: Resolved  No respiratory symptoms this a.m.  S/p Completed IV ceftriaxone and azithromycin 12/19  Acute nonischemic myocardial injury : ECG without evidence of acute ischemia. Troponin peaked at 110s. Likely due to elevated blood pressure or possible seizure like activity on admission related to his alcohol use.    Severe aortic stenosis: 2D echo showed  EF 55 to 60% and severe aortic stenosis.   Plan for right and left heart cath as an inpatient now deferred.   --Cardiology recommended outpatient follow-up for further  evaluation. Signed off 12/18.  Hypoglycemia glucose 67 on 12/22.  No recurrence, closely monitor. He is not on any hypoglycemic agent. Synthetic liver function is normal. No evidence of liver injury. Cortisol level 11.5. ACTH stimulation test is normal.  --Continue to encourage PO intake --Hypoglycemia protocol  Normocytic anemia  Low normal vitamin B12 (193): Continue vitamin B12 supplement Normal ferritin but TSAT 14% Given IV iron Possibly nutrition-related in the setting of alcohol use --Recommend outpatient colonoscopy  --Monitor CBC  Leukocytosis: Resolved    Diet Order             DIET DYS 3 Room service appropriate? Yes with Assist; Fluid consistency: Thin  Diet effective now                   Consultants: Cardiology   Procedures: None    Medications:    aspirin EC  81 mg Oral Daily   baclofen  10 mg Oral BID   carvedilol  6.25 mg Oral BID WC   vitamin B-12  1,000 mcg Oral Daily   enoxaparin (LOVENOX) injection  40 mg Subcutaneous Q24H   folic acid  1 mg Oral Daily   multivitamin with minerals  1 tablet Oral Daily   polyethylene glycol  17 g Oral Daily   senna  1 tablet Oral Daily   thiamine  100 mg Oral Daily   Or   thiamine  100 mg Intravenous Daily   Continuous Infusions:     Anti-infectives (From admission, onward)    Start     Dose/Rate Route Frequency Ordered Stop   04/25/23 1400  azithromycin (ZITHROMAX) 500 mg in sodium chloride 0.9 % 250 mL IVPB        500 mg 250 mL/hr over 60 Minutes Intravenous Every 24 hours 04/25/23 1216 04/29/23 1929   04/25/23 1330  cefTRIAXone (ROCEPHIN) 2 g in sodium chloride 0.9 % 100 mL IVPB  Status:  Discontinued        2 g 200 mL/hr over 30 Minutes Intravenous Every 24 hours 04/25/23 1216 04/29/23 1515              Family Communication/Anticipated D/C date and plan/Code Status   DVT prophylaxis: enoxaparin (LOVENOX) injection 40 mg Start: 04/25/23 1000     Code Status: Full  Code  Family Communication: Pt's brother at bedside on rounds 12/24.  None present today. Will attempt to call.  Disposition Plan: SNF   Status is: Inpatient Remains inpatient appropriate because: Awaiting SNF placement   Subjective:   Pt sitting up in bed eating breakfast when seen this AM.  He denies any acute complaints, states he's feeling well.   Objective:    Vitals:   05/05/23 1701 05/05/23 2041 05/06/23 0434 05/06/23 0820  BP: (!) 146/85 128/77 (!) 148/80 139/77  Pulse: 87 98 83 74  Resp: 16 17 17 16   Temp: 98.5 F (36.9 C) 99.4 F (37.4 C) 98.4 F (36.9 C) 97.9 F (36.6 C)  TempSrc:      SpO2: 99% 93% 95% 97%  Weight:      Height:       No data found.   Intake/Output Summary (Last 24 hours) at 05/06/2023 1110 Last data filed at 05/06/2023 0434 Gross per 24 hour  Intake 250 ml  Output 900 ml  Net -650 ml   Filed Weights   04/23/23 2128 04/24/23 0700 04/26/23 0646  Weight: 64 kg 64 kg 63.2 kg    Physical Exam   General exam: awake, alert, no acute distress HEENT: moist mucus membranes, hearing grossly normal  Respiratory system: CTAB no wheezes, rales or rhonchi, normal respiratory effort. Cardiovascular system: normal S1/S2, RRR, no pedal edema.   Gastrointestinal system: soft, NT, ND, no HSM felt, +bowel sounds. Central nervous system: A&O x 2+. no gross focal neurologic deficits, normal speech Extremities: moves all, no edema, normal tone Skin: dry, intact, normal temperature Psychiatry: normal mood, congruent affect      Data Reviewed:   I have personally reviewed following labs and imaging studies:  Labs: Labs show the following:   Basic Metabolic Panel: Recent Labs  Lab 05/01/23 0453 05/05/23 0547  NA 135 135  K 4.1 3.7  CL 100 99  CO2 24 26  GLUCOSE 97 95  BUN 12 13  CREATININE 0.75 0.79  CALCIUM 9.5 9.4  MG 1.9  --   PHOS 3.8  --    GFR Estimated Creatinine Clearance: 82.3 mL/min (by C-G formula based on SCr of  0.79 mg/dL). Liver Function Tests: Recent Labs  Lab 05/01/23 0453  AST 23  ALT 16  ALKPHOS 52  BILITOT 0.5  PROT 7.6  ALBUMIN 3.2*   No results for input(s): "LIPASE", "AMYLASE" in the last 168 hours. No results for input(s): "AMMONIA" in the last 168 hours.  Coagulation profile No results for input(s): "INR", "PROTIME" in the last 168 hours.   CBC: Recent Labs  Lab 05/01/23 0453 05/02/23 0425  WBC 7.5 5.4  HGB 12.0* 12.1*  HCT 34.2* 34.9*  MCV 92.2 92.3  PLT 281 327   Cardiac Enzymes: No results for input(s): "CKTOTAL", "CKMB", "CKMBINDEX", "TROPONINI" in the last 168 hours. BNP (last 3 results) No results for input(s): "PROBNP" in the last 8760 hours. CBG: Recent Labs  Lab 05/02/23 0716 05/02/23 0734 05/03/23 0834 05/04/23 0726 05/06/23 0907  GLUCAP 102* 102* 81 84 79   D-Dimer: No results for input(s): "DDIMER" in the last 72 hours. Hgb A1c: No results for input(s): "HGBA1C" in the last 72 hours. Lipid Profile: No results for input(s): "CHOL", "HDL", "LDLCALC", "TRIG", "CHOLHDL", "LDLDIRECT" in the last 72 hours. Thyroid function studies: No results for input(s): "TSH", "T4TOTAL", "T3FREE", "THYROIDAB" in the last 72 hours.  Invalid input(s): "FREET3" Anemia work up: No results for input(s): "VITAMINB12", "FOLATE", "FERRITIN", "TIBC", "IRON", "RETICCTPCT" in the last 72 hours.  Sepsis Labs: Recent Labs  Lab 05/01/23 0453 05/02/23 0425  WBC 7.5 5.4    Microbiology No results found for this or any previous visit (from the past 240 hours).   Procedures and diagnostic studies:  No results found.               LOS: 13 days   Pennie Banter DO [facility]   Pager on [email or web address]. If 7PM-7AM, please contact night-coverage at [email or web address]     05/06/2023, 11:10 AM

## 2023-05-07 DIAGNOSIS — G9341 Metabolic encephalopathy: Secondary | ICD-10-CM | POA: Diagnosis not present

## 2023-05-07 DIAGNOSIS — F10931 Alcohol use, unspecified with withdrawal delirium: Secondary | ICD-10-CM | POA: Diagnosis not present

## 2023-05-07 DIAGNOSIS — R569 Unspecified convulsions: Secondary | ICD-10-CM

## 2023-05-07 LAB — GLUCOSE, CAPILLARY: Glucose-Capillary: 91 mg/dL (ref 70–99)

## 2023-05-07 MED ORDER — FOLIC ACID 1 MG PO TABS: 1.0000 mg | ORAL_TABLET | Freq: Every day | ORAL | Status: DC

## 2023-05-07 MED ORDER — CYANOCOBALAMIN 1000 MCG PO TABS: 1000.0000 ug | ORAL_TABLET | Freq: Every day | ORAL | Status: DC

## 2023-05-07 MED ORDER — CARVEDILOL 6.25 MG PO TABS: 6.2500 mg | ORAL_TABLET | Freq: Two times a day (BID) | ORAL | Status: AC

## 2023-05-07 MED ORDER — ADULT MULTIVITAMIN W/MINERALS CH: 1.0000 | ORAL_TABLET | Freq: Every day | ORAL | Status: DC

## 2023-05-07 MED ORDER — VITAMIN B-1 100 MG PO TABS: 100.0000 mg | ORAL_TABLET | Freq: Every day | ORAL | Status: DC

## 2023-05-07 MED ORDER — POLYETHYLENE GLYCOL 3350 17 G PO PACK: 17.0000 g | PACK | Freq: Every day | ORAL | Status: DC

## 2023-05-07 MED ORDER — ASPIRIN 81 MG PO TBEC: 81.0000 mg | DELAYED_RELEASE_TABLET | Freq: Every day | ORAL | Status: AC

## 2023-05-07 MED ORDER — BISACODYL 10 MG RE SUPP: 10.0000 mg | Freq: Every day | RECTAL | Status: DC | PRN

## 2023-05-07 MED ORDER — ACETAMINOPHEN 325 MG PO TABS: 650.0000 mg | ORAL_TABLET | Freq: Four times a day (QID) | ORAL | Status: DC | PRN

## 2023-05-07 NOTE — Plan of Care (Signed)
  Problem: Health Behavior/Discharge Planning: Goal: Ability to manage health-related needs will improve Outcome: Progressing   Problem: Clinical Measurements: Goal: Respiratory complications will improve Outcome: Progressing   Problem: Elimination: Goal: Will not experience complications related to bowel motility Outcome: Progressing   Problem: Pain Management: Goal: General experience of comfort will improve Outcome: Progressing

## 2023-05-07 NOTE — Discharge Summary (Signed)
Physician Discharge Summary  Patient: Steve Kelly:[id number] DOB: 1955/07/31   Code Status: Full Code Admit date: 04/23/2023 Discharge date: 05/07/2023 Disposition: Skilled nursing facility, PT, OT, nurse aid, RN, and social worker PCP: Ellis Parents, FNP  Recommendations for Outpatient Follow-up:  Follow up with PCP within 2-3 weeks Regarding general hospital follow up and preventative care Cardiology f/u in 2-3 weeks Recommended LHC and RHC for ischemic evaluation  Consider cardiothoracic surgeon referral for severe AS  Discharge Diagnoses:  Principal Problem:   Metabolic encephalopathy Active Problems:   CAP (community acquired pneumonia)   Alcohol withdrawal (HCC)   Delirium tremens (HCC)   Hypertension   Myocardial injury   Leukocytosis   Aortic stenosis   Elevated troponin   Slurred speech  Brief Hospital Course Summary: Steve Kelly is a 67 y.o. male with medical history significant of hypertension, GERD, alcohol use disorder, fatty liver, delirium tremens, who presented to the hospital with altered mental status.   Reportedly had seizure-like activity followed by unresponsiveness and aphasia.  Stat CT head and MRI brain were negative for stroke. It was thought to be due to alcohol withdrawal and early dementia with seizure and Dts prior to arrival. He was started on CIWA protocol.  UDS was positive for cannabinoids and alcohol level was less than 10. He was started on phenobarbital taper initially in ICU followed by librium and benzodiazapine taper. Has been without seizure activity and not needed PRN benzo since 12/14. He is continued on nutritional supplements and CIWA protocols were discontinued. (>13 days post last known drink)    Cardiology was consulted due to reported chest pain with elevated troponins up to 118 thought to be due to demand ischemia. Cath was not recommended this admission due to his acute illness and altered mental status but  cardiology recommended future ischemic workup. Echo showed severe aortic stenosis. Recommended continued BB and ASA. Cards signed off 12/18.  Additionally, treated for CAP during hospitalization and completed ceftriaxone and azithromycin course during stay. Has been stable ORA without respiratory complaints for several days.    Discharge Condition: Stable, improved Recommended discharge diet: Regular healthy diet  Consultations: Neurology  [facility] Cardiology   Procedures/Studies: Echo   Allergies as of 05/07/2023   No Known Allergies      Medication List     STOP taking these medications    aspirin 81 MG chewable tablet Replaced by: aspirin EC 81 MG tablet   atenolol 50 MG tablet Commonly known as: TENORMIN   Vitamin D (Ergocalciferol) 1.25 MG (50000 UNIT) Caps capsule Commonly known as: DRISDOL       TAKE these medications    acetaminophen 325 MG tablet Commonly known as: TYLENOL Take 2 tablets (650 mg total) by mouth every 6 (six) hours as needed for moderate pain (pain score 4-6) (headache).   aspirin EC 81 MG tablet Take 1 tablet (81 mg total) by mouth daily. Swallow whole. Start taking on: May 08, 2023 Replaces: aspirin 81 MG chewable tablet   bisacodyl 10 MG suppository Commonly known as: DULCOLAX Place 1 suppository (10 mg total) rectally daily as needed for moderate constipation.   carvedilol 6.25 MG tablet Commonly known as: COREG Take 1 tablet (6.25 mg total) by mouth 2 (two) times daily with a meal.   cyanocobalamin 1000 MCG tablet Take 1 tablet (1,000 mcg total) by mouth daily. Start taking on: May 08, 2023   famotidine 20 MG tablet Commonly known as: PEPCID Take 1 tablet (  20 mg total) by mouth daily.   folic acid 1 MG tablet Commonly known as: FOLVITE Take 1 tablet (1 mg total) by mouth daily. Start taking on: May 08, 2023   multivitamin with minerals Tabs tablet Take 1 tablet by mouth daily. Start taking on: May 08, 2023   polyethylene glycol 17 g packet Commonly known as: MIRALAX / GLYCOLAX Take 17 g by mouth daily. Start taking on: May 08, 2023   thiamine 100 MG tablet Commonly known as: Vitamin B-1 Take 1 tablet (100 mg total) by mouth daily. Start taking on: May 08, 2023        Contact information for follow-up providers     Ellis Parents, FNP Follow up.   Specialty: Nurse Practitioner Why: Hospital follow up Contact information: [address] [place] [address] [phone]              Contact information for after-discharge care     Destination     [facility] .   Service: Skilled Nursing Contact information: [address] [place] [address] [phone]                     Subjective   Pt reports no complaints. Denies SOB, chest pain.  All questions and concerns were addressed at time of discharge.  Objective  Blood pressure 131/79, pulse 77, temperature 98.3 F (36.8 C), resp. rate 18, height 5' 9.02" (1.753 m), weight 63.2 kg, SpO2 95%.   General: Pt is alert, awake, not in acute distress Cardiovascular: RRR, S1/S2 +, no rubs, no gallops Respiratory: CTA bilaterally, no wheezing, no rhonchi Abdominal: Soft, NT, ND, bowel sounds + Neuro: oriented to self and location. Not year or his circumstances Extremities: no edema, no cyanosis  The results of significant diagnostics from this hospitalization (including imaging, microbiology, ancillary and laboratory) are listed below for reference.   Imaging studies: CT HEAD WO CONTRAST ( ) Result Date: 05/03/2023 CLINICAL DATA:  Fall from toilet, hit head EXAM: CT HEAD WITHOUT CONTRAST TECHNIQUE: Contiguous axial images were obtained from the base of the skull through the vertex without intravenous contrast. RADIATION DOSE REDUCTION: This exam was performed according to the departmental dose-optimization program which includes automated exposure control,  adjustment of the mA and/or kV according to patient size and/or use of iterative reconstruction technique. COMPARISON:  04/23/2023 FINDINGS: Brain: No evidence of acute infarction, hemorrhage, mass, mass effect, or midline shift. No hydrocephalus or extra-axial fluid collection. Periventricular white matter changes, likely the sequela of chronic small vessel ischemic disease. Advanced cerebral volume loss for age. Vascular: No hyperdense vessel. Atherosclerotic calcifications in the intracranial carotid and vertebral arteries. Skull: Negative for fracture or focal lesion. Sinuses/Orbits: Right maxillary mucous retention cysts. Otherwise clear paranasal sinuses. No acute finding in the orbits. Other: The mastoid air cells are well aerated. IMPRESSION: No acute intracranial process. Electronically Signed   By: Wiliam Ke M.D.   On: 05/03/2023 20:23   EEG adult Result Date: 04/26/2023 Charlsie Quest, MD     04/26/2023  5:13 PM Patient Name: YUJI GISMONDI MRN: [id number] Epilepsy Attending: Charlsie Quest Referring Physician/Provider: Arnetha Courser, MD Date: 04/26/2023 Duration: 28.04 mins Patient history: 67yo M with ams getting eeg to evaluate for seizure Level of alertness: Awake AEDs during EEG study: Librium, Phenobarb Technical aspects: This EEG study was done with scalp electrodes positioned according to the 10-20 International system of electrode placement. Electrical activity was reviewed with band pass  filter of 1-70Hz , sensitivity of 7 uV/mm, display speed of 23mm/sec with a 60Hz  notched filter applied as appropriate. EEG data were recorded continuously and digitally stored.  Video monitoring was available and reviewed as appropriate. Description: The posterior dominant rhythm consists of 8 Hz activity of moderate voltage (25-35 uV) seen predominantly in posterior head regions, symmetric and reactive to eye opening and eye closing. EEG showed intermittent generalized 3 to 6 Hz theta-delta  slowing. Hyperventilation and photic stimulation were not performed.   ABNORMALITY - Intermittent slow, generalized IMPRESSION: This study is suggestive of mild diffuse encephalopathy. No seizures or epileptiform discharges were seen throughout the recording. Charlsie Quest   ECHOCARDIOGRAM COMPLETE Result Date: 04/25/2023    ECHOCARDIOGRAM REPORT   Patient Name:   SHONTEZ RINDAL Date of Exam: 04/25/2023 Medical Rec #:  [id number]        Height:       69.0 in Accession #:    [id number]       Weight:       141.1 lb Date of Birth:  [birth year]        BSA:          1.781 m Patient Age:    67 years         BP:           142/92 mmHg Patient Gender: M                HR:           114 bpm. Exam Location:  [facility] Procedure: 2D Echo Indications:     Abnormal ECG R94.31  History:         Patient has no prior history of Echocardiogram examinations.  Sonographer:     Overton Mam RDCS, FASE Referring Phys:  [id number] DGLOVFI AMIN Diagnosing Phys: Debbe Odea MD  Sonographer Comments: Image acquisition challenging due to uncooperative patient. IMPRESSIONS  1. Left ventricular ejection fraction, by estimation, is 55 to 60%. The left ventricle has normal function. The left ventricle has no regional wall motion abnormalities. There is mild left ventricular hypertrophy. Left ventricular diastolic parameters are consistent with Grade I diastolic dysfunction (impaired relaxation).  2. Right ventricular systolic function is normal. The right ventricular size is normal.  3. The mitral valve is normal in structure. No evidence of mitral valve regurgitation.  4. Mean aortic valve gradient , peak gradient , Vmax 4.1 m/s, AVA 0.74cm2.. The aortic valve is calcified. Aortic valve regurgitation is not visualized. Severe aortic valve stenosis.  5. The inferior vena cava is normal in size with <50% respiratory variability, suggesting right atrial pressure of 8 mmHg. Conclusion(s)/Recommendation(s): Findings consistent  with severe valvular heart disease. FINDINGS  Left Ventricle: Left ventricular ejection fraction, by estimation, is 55 to 60%. The left ventricle has normal function. The left ventricle has no regional wall motion abnormalities. The left ventricular internal cavity size was normal in size. There is  mild left ventricular hypertrophy. Left ventricular diastolic parameters are consistent with Grade I diastolic dysfunction (impaired relaxation). Right Ventricle: The right ventricular size is normal. No increase in right ventricular wall thickness. Right ventricular systolic function is normal. Left Atrium: Left atrial size was normal in size. Right Atrium: Right atrial size was normal in size. Pericardium: There is no evidence of pericardial effusion. Mitral Valve: The mitral valve is normal in structure. No evidence of mitral valve regurgitation. Tricuspid Valve: The tricuspid valve is normal in structure. Tricuspid valve regurgitation is mild. Aortic Valve:  Mean aortic valve gradient , peak gradient , Vmax 4.1 m/s, AVA 0.74cm2. The aortic valve is calcified. Aortic valve regurgitation is not visualized. Aortic regurgitation PHT measures 436 msec. Severe aortic stenosis is present. Aortic valve mean gradient measures 39.0 mmHg. Aortic valve peak gradient measures 64.5 mmHg. Aortic valve area, by VTI measures 0.75 cm. Pulmonic Valve: The pulmonic valve was normal in structure. Pulmonic valve regurgitation is not visualized. Aorta: The aortic root and ascending aorta are structurally normal, with no evidence of dilitation. Venous: The inferior vena cava is normal in size with less than 50% respiratory variability, suggesting right atrial pressure of 8 mmHg. IAS/Shunts: No atrial level shunt detected by color flow Doppler.  LEFT VENTRICLE PLAX 2D LVIDd:         3.80 cm   Diastology LVIDs:         2.40 cm   LV e' medial:    8.27 cm/s LV PW:         1.40 cm   LV E/e' medial:  9.1 LV IVS:        1.10 cm   LV e'  lateral:   6.53 cm/s LVOT diam:     1.90 cm   LV E/e' lateral: 11.5 LV SV:         57 LV SV Index:   32 LVOT Area:     2.84 cm  RIGHT VENTRICLE RV Basal diam:  2.20 cm RV S prime:     19.30 cm/s TAPSE (M-mode): 1.8 cm LEFT ATRIUM           Index        RIGHT ATRIUM           Index LA diam:      2.80 cm 1.57 cm/m   RA Area:     10.40 cm LA Vol (A4C): 26.7 ml 14.99 ml/m  RA Volume:   17.70 ml  9.94 ml/m  AORTIC VALVE                     PULMONIC VALVE AV Area (Vmax):    0.70 cm      PV Vmax:       0.61 m/s AV Area (Vmean):   0.63 cm      PV Peak grad:  1.5 mmHg AV Area (VTI):     0.75 cm AV Vmax:           401.50 cm/s AV Vmean:          291.500 cm/s AV VTI:            0.755 m AV Peak Grad:      64.5 mmHg AV Mean Grad:      39.0 mmHg LVOT Vmax:         99.80 cm/s LVOT Vmean:        64.500 cm/s LVOT VTI:          0.201 m LVOT/AV VTI ratio: 0.27 AI PHT:            436 msec  AORTA Ao Root diam: 3.40 cm Ao Asc diam:  3.10 cm MITRAL VALVE               TRICUSPID VALVE MV Area (PHT): 4.99 cm    TV Peak grad:   28.0 mmHg MV Decel Time: 152 msec    TV Vmax:        2.64 m/s MV E velocity: 74.90 cm/s MV A velocity: 94.90 cm/s  SHUNTS  MV E/A ratio:  0.79        Systemic VTI:  0.20 m                            Systemic Diam: 1.90 cm Debbe Odea MD Electronically signed by Debbe Odea MD Signature Date/Time: 04/25/2023/1:49:23 PM    Final    DG Chest Port 1 View Result Date: 04/25/2023 CLINICAL DATA:  Fever EXAM: PORTABLE CHEST - 1 VIEW COMPARISON:  04/23/2023 FINDINGS: Worsening airspace disease in the right mid and lower lung. Persistent left retrocardiac opacity. Heart size and mediastinal contours are within normal limits. Blunting right lateral costophrenic angle suggesting small effusion. Visualized bones unremarkable. IMPRESSION: Worsening right mid and lower lung airspace disease. Electronically Signed   By: Corlis Leak M.D.   On: 04/25/2023 11:54   DG Chest Portable 1 View Result Date:  04/23/2023 CLINICAL DATA:  Shortness of breath and tachycardia EXAM: PORTABLE CHEST 1 VIEW COMPARISON:  06/29/2009 FINDINGS: The heart size and mediastinal contours are within normal limits. Both lungs are clear. The visualized skeletal structures are unremarkable. IMPRESSION: No active disease. Electronically Signed   By: Minerva Fester M.D.   On: 04/23/2023 19:56   MR BRAIN WO CONTRAST Result Date: 04/23/2023 CLINICAL DATA:  Follow-up exam pronation for stroke. EXAM: MRI HEAD WITHOUT CONTRAST TECHNIQUE: Multiplanar, multiecho pulse sequences of the brain and surrounding structures were obtained without intravenous contrast. COMPARISON:  Prior CT from earlier the same day. FINDINGS: Brain: Examination degraded by motion artifact. Mildly advanced cerebral atrophy. Patchy T2/FLAIR hyperintensity involving the periventricular deep white matter both cerebral hemispheres, consistent with chronic small vessel ischemic disease, moderately advanced. Few small remote cerebellar infarcts noted. No evidence for acute or subacute ischemia. No acute or chronic intracranial blood products. No mass lesion, midline shift or mass effect. No hydrocephalus or extra-axial fluid collection. Pituitary gland suprasellar region grossly within normal limits. Vascular: Major intracranial vascular flow voids are maintained. Skull and upper cervical spine: Craniocervical junction with level limits. Bone marrow signal intensity normal. No scalp soft tissue abnormality. Sinuses/Orbits: Globes and orbital soft tissues within normal limits. Right maxillary sinus retention cyst. Paranasal sinuses are otherwise clear. Trace right mastoid effusion, of doubtful significance. Other: None. IMPRESSION: 1. No acute intracranial abnormality. 2. Moderately advanced cerebral atrophy with chronic microvascular ischemic disease. Few small remote cerebellar infarcts. Electronically Signed   By: Rise Mu M.D.   On: 04/23/2023 17:52   CT HEAD  CODE STROKE WO CONTRAST Result Date: 04/23/2023 CLINICAL DATA:  Code stroke.  Neuro deficit acute, stroke suspected. EXAM: CT HEAD WITHOUT CONTRAST TECHNIQUE: Contiguous axial images were obtained from the base of the skull through the vertex without intravenous contrast. RADIATION DOSE REDUCTION: This exam was performed according to the departmental dose-optimization program which includes automated exposure control, adjustment of the mA and/or kV according to patient size and/or use of iterative reconstruction technique. COMPARISON:  CT head without contrast 03/14/2023 FINDINGS: Brain: No acute infarct, hemorrhage, or mass lesion is present. Moderately advanced atrophy and white matter changes are similar to the prior exam. Deep brain nuclei are within normal limits. The ventricles are of normal size. No significant extraaxial fluid collection is present. The brainstem and cerebellum are within normal limits. Midline structures are within normal limits. Vascular: No hyperdense vessel or unexpected calcification. Skull: Calvarium is intact. No focal lytic or blastic lesions are present. No significant extracranial soft tissue lesion is present. Sinuses/Orbits: The paranasal  sinuses and mastoid air cells are clear. The globes and orbits are within normal limits. ASPECTS [facility] Stroke Program Early CT Score) - Ganglionic level infarction (caudate, lentiform nuclei, internal capsule, insula, M1-M3 cortex): 7/7 - Supraganglionic infarction (M4-M6 cortex): 3/3 Total score (0-10 with 10 being normal): 10/10 IMPRESSION: 1. No acute intracranial abnormality or significant interval change. 2. Moderately advanced atrophy and white matter disease. This likely reflects the sequela of chronic microvascular ischemia. These results were called by telephone at the time of interpretation on 04/23/2023 at 4:14 pm to provider Milon Dikes, who verbally acknowledged these results. Electronically Signed   By: Marin Roberts  M.D.   On: 04/23/2023 16:15    Labs: Basic Metabolic Panel: Recent Labs  Lab 05/01/23 0453 05/05/23 0547  NA 135 135  K 4.1 3.7  CL 100 99  CO2 24 26  GLUCOSE 97 95  BUN 12 13  CREATININE 0.75 0.79  CALCIUM 9.5 9.4  MG 1.9  --   PHOS 3.8  --    CBC: Recent Labs  Lab 05/01/23 0453 05/02/23 0425  WBC 7.5 5.4  HGB 12.0* 12.1*  HCT 34.2* 34.9*  MCV 92.2 92.3  PLT 281 327   Microbiology: Results for orders placed or performed during the hospital encounter of 04/23/23  MRSA Next Gen by PCR, Nasal     Status: None   Collection Time: 04/25/23  9:39 AM   Specimen: Nasal Mucosa; Nasal Swab  Result Value Ref Range Status   MRSA by PCR Next Gen NOT DETECTED NOT DETECTED Final    Comment: (NOTE) The GeneXpert MRSA Assay (FDA approved for NASAL specimens only), is one component of a comprehensive MRSA colonization surveillance program. It is not intended to diagnose MRSA infection nor to guide or monitor treatment for MRSA infections. Test performance is not FDA approved in patients less than 87 years old. Performed at [facility], [address] [place], [place] [address]   Respiratory (~20 pathogens) panel by PCR     Status: None   Collection Time: 04/25/23 12:40 PM   Specimen: Nasopharyngeal Swab; Respiratory  Result Value Ref Range Status   Adenovirus NOT DETECTED NOT DETECTED Final   Coronavirus 229E NOT DETECTED NOT DETECTED Final    Comment: (NOTE) The Coronavirus on the Respiratory Panel, DOES NOT test for the novel  Coronavirus (2019 nCoV)    Coronavirus HKU1 NOT DETECTED NOT DETECTED Final   Coronavirus NL63 NOT DETECTED NOT DETECTED Final   Coronavirus OC43 NOT DETECTED NOT DETECTED Final   Metapneumovirus NOT DETECTED NOT DETECTED Final   Rhinovirus / Enterovirus NOT DETECTED NOT DETECTED Final   Influenza A NOT DETECTED NOT DETECTED Final   Influenza B NOT DETECTED NOT DETECTED Final   Parainfluenza Virus 1 NOT DETECTED NOT DETECTED Final    Parainfluenza Virus 2 NOT DETECTED NOT DETECTED Final   Parainfluenza Virus 3 NOT DETECTED NOT DETECTED Final   Parainfluenza Virus 4 NOT DETECTED NOT DETECTED Final   Respiratory Syncytial Virus NOT DETECTED NOT DETECTED Final   Bordetella pertussis NOT DETECTED NOT DETECTED Final   Bordetella Parapertussis NOT DETECTED NOT DETECTED Final   Chlamydophila pneumoniae NOT DETECTED NOT DETECTED Final   Mycoplasma pneumoniae NOT DETECTED NOT DETECTED Final    Comment: Performed at [facility], [address] [place], [place] [address]  Culture, blood (Routine X 2) w Reflex to ID Panel     Status: None   Collection Time: 04/25/23  1:31 PM  Specimen: BLOOD LEFT HAND  Result Value Ref Range Status   Specimen Description BLOOD LEFT HAND  Final   Special Requests   Final    BOTTLES DRAWN AEROBIC ONLY Blood Culture results may not be optimal due to an inadequate volume of blood received in culture bottles   Culture   Final    NO GROWTH 5 DAYS Performed at [facility], [address], [place], [place] [address]    Report Status 04/30/2023 FINAL  Final  Culture, blood (Routine X 2) w Reflex to ID Panel     Status: None   Collection Time: 04/25/23  1:31 PM   Specimen: BLOOD RIGHT HAND  Result Value Ref Range Status   Specimen Description BLOOD RIGHT HAND  Final   Special Requests   Final    BOTTLES DRAWN AEROBIC AND ANAEROBIC Blood Culture adequate volume   Culture   Final    NO GROWTH 5 DAYS Performed at [facility], [address], [place], [place] [address]    Report Status 04/30/2023 FINAL  Final   Time coordinating discharge: Over 30 minutes  Leeroy Bock, MD  [facility] 05/07/2023, 9:07 AM

## 2023-07-14 ENCOUNTER — Ambulatory Visit: Payer: Medicare Other

## 2023-07-30 DIAGNOSIS — I350 Nonrheumatic aortic (valve) stenosis: Secondary | ICD-10-CM | POA: Insufficient documentation

## 2023-07-30 DIAGNOSIS — E663 Overweight: Secondary | ICD-10-CM | POA: Insufficient documentation

## 2023-07-30 DIAGNOSIS — Z8673 Personal history of transient ischemic attack (TIA), and cerebral infarction without residual deficits: Secondary | ICD-10-CM | POA: Insufficient documentation

## 2023-07-30 DIAGNOSIS — R011 Cardiac murmur, unspecified: Secondary | ICD-10-CM | POA: Insufficient documentation

## 2023-07-30 DIAGNOSIS — E785 Hyperlipidemia, unspecified: Secondary | ICD-10-CM | POA: Insufficient documentation

## 2023-07-30 DIAGNOSIS — F1010 Alcohol abuse, uncomplicated: Secondary | ICD-10-CM | POA: Insufficient documentation

## 2023-07-30 DIAGNOSIS — N183 Chronic kidney disease, stage 3 unspecified: Secondary | ICD-10-CM | POA: Insufficient documentation

## 2023-08-03 ENCOUNTER — Ambulatory Visit

## 2023-08-03 VITALS — BP 136/68 | HR 70 | Ht 66.0 in | Wt 154.4 lb

## 2023-08-03 DIAGNOSIS — I350 Nonrheumatic aortic (valve) stenosis: Secondary | ICD-10-CM | POA: Insufficient documentation

## 2023-08-03 NOTE — Patient Instructions (Signed)
 Medication Instructions:  Your physician recommends that you continue on your current medications as directed. Please refer to the Current Medication list given to you today.  *If you need a refill on your cardiac medications before your next appointment, please call your pharmacy*   Lab Work: Your physician recommends that you return for lab work in:   Labs today: CBC, BMP  If you have labs (blood work) drawn today and your tests are completely normal, you will receive your results only by: MyChart Message (if you have MyChart) OR A paper copy in the mail If you have any lab test that is abnormal or we need to change your treatment, we will call you to review the results.   Testing/Procedures:  [place] [facility] DE[facility] OF [facility] [place] [address] [place] [address] Dept: [phone] Loc: [phone]  EMIN FOREE  08/03/2023  You are scheduled for a Cardiac Catheterization on Tuesday, April 1 with Dr. Alverda Skeans.  1. Please arrive at the [facility] (Main Entrance A) at [facility]: [address] [place], [place] [address] at 10:00 AM (This time is 2 hour(s) before your procedure to ensure your preparation).   Free valet parking service is available. You will check in at ADMITTING. The support person will be asked to wait in the waiting room.  It is OK to have someone drop you off and come back when you are ready to be discharged.    Special note: Every effort is made to have your procedure done on time. Please understand that emergencies sometimes delay scheduled procedures.  2. Diet: Do not eat solid foods after midnight.  The patient may have clear liquids until 5am upon the day of the procedure.  3. Labs: You will need to have blood drawn on Tuesday, March 25 at [facility]: [address] [place] . You do not need to be fasting.  4. Medication instructions in preparation for your  procedure:   Contrast Allergy: No  On the morning of your procedure, take your Aspirin 81 mg and any morning medicines NOT listed above.  You may use sips of water.  5. Plan to go home the same day, you will only stay overnight if medically necessary. 6. Bring a current list of your medications and current insurance cards. 7. You MUST have a responsible person to drive you home. 8. Someone MUST be with you the first 24 hours after you arrive home or your discharge will be delayed. 9. Please wear clothes that are easy to get on and off and wear slip-on shoes.  Thank you for allowing [place] to care for you!   -- [facility]    Follow-Up: At [facility], you and your health needs are our priority.  As part of our continuing mission to provide you with exceptional heart care, we have created designated Provider Care Teams.  These Care Teams include your primary Cardiologist (physician) and Advanced Practice Providers (APPs -  Physician Assistants and Nurse Practitioners) who all work together to provide you with the care you need, when you need it.  We recommend signing up for the patient portal called "MyChart".  Sign up information is provided on this After Visit Summary.  MyChart is used to connect with patients for Virtual Visits (Telemedicine).  Patients are able to view lab/test results, encounter notes, upcoming appointments, etc.  Non-urgent messages can be sent  to your provider as well.   To learn more about what you can do with MyChart, go to [email or web address].    Your next appointment:   1 month  Provider:   Huntley Dec, MD    Other Instructions None

## 2023-08-03 NOTE — Progress Notes (Signed)
 Cardiology Consultation:    Date:  08/03/2023   ID:  Steve Kelly, DOB 13-Sep-1955, MRN [id number]  PCP:  Allen, [place], NP  Cardiologist:  Marlyn Corporal Kimm Sider, MD   Referring MD: Allen, [place], NP   No chief complaint on file.    ASSESSMENT AND PLAN:   Steve Kelly 68 year old with history of severe aortic stenosis, hypertension, alcohol abuse, former smoker, prediabetes, GERD, episodes of delirium tremens in the past and more recently altered mentation attributed to delirium tremens in December 2024 subsequently at skilled nursing facility for couple months and now living with his brother here for follow-up after his initial visit with me in November 2024.  Problem List Items Addressed This Visit     Aortic stenosis, severe - Primary   Severe aortic stenosis. Relatively asymptomatic at this time.  Echocardiogram December 2024 while inpatient [facility] noted V-max 4.1 m/s, mean gradient 40 mmHg.  Discussed pathophysiology and further management possibly for aortic valve replacement by surgical or TAVR approach. Discussed these methods at length.  Discussed prior workup with cardiac catheterization and structural heart team evaluation.  Shared Decision Making/Informed Consent{ The risks [stroke (1 in 1000), death (1 in 1000), kidney failure [usually temporary] (1 in 500), bleeding (1 in 200), allergic reaction [possibly serious] (1 in 200)], benefits (diagnostic support and management of coronary artery disease) and alternatives of a cardiac catheterization were discussed in detail with him and his brother and he is willing to proceed.   Will schedule tentatively for cardiac cath at [facility] in the next few days.  Advised him to avoid any moderate to heavy physical exertional activities. If he does have any significant symptoms of chest pain or shortness of breath to proceed to the ER or call 911.  Advised him to aspirin 81 mg once daily Advised him to take  carvedilol 2 times a day as prescribed compared to his current to 6.25 mg once a day which has been taking.       Relevant Orders   Basic Metabolic Panel (BMET)   CBC   EKG 12-Lead (Completed)      History of Present Illness:    Steve Kelly is a 68 y.o. male who is being seen today for follow-up visit. Last visit with me in the office was 03-23-2023. PCP is Allen, [place], NP, recent follow-up with PCP 07-28-2023 at [facility].  He for the visit today accompanied by his brother.  Has a history of severe aortic stenosis, hypertension with alcohol abuse, former smoker, prediabetes, GERD,  episodes of delirium tremens in the past.  At last visit with me in November 2024 he was here to establish care in the setting of systolic murmur noted by PCP.  Echocardiogram was ordered.  Before this could be completed he was admitted to the hospital [phone] and remained inpatient until 05-07-2023 for altered mentation in the setting of delirium tremens.  High-sensitivity troponins were elevated downtrending 118, 117, 95, 98.  Echocardiogram during inpatient stay 04-25-2023 confirmed severe aortic stenosis with V-max 4.1 m/s and mean gradient 40 mmHg, normal biventricular function with LVEF 55 to 60% and cardiology inpatient evaluation was done by Dr. Brion Aliment and given his altered mentation felt not to be candidate for inpatient workup and recommended outpatient follow-up with cardiology.  Subsequently was discharged to skilled nursing facility.  Pleasant man here for the visit by himself.  Mentions he was discharged home about a month ago and has been living  with his brother since. Has home physical therapy going on.  Denies any chest pain or shortness of breath.  Denies any passing out episodes. EKG in the clinic today shows sinus rhythm heart rate 65/min, PR interval normal 132 ms, QRS morphology consistent with LVH criteria.  Nonspecific T wave changes in inferior  leads.  Blood work available from PCPs office notes reviewed Show lipid panel total cholesterol 172, triglycerides 100, HDL 48, LDL 106. Hemoglobin 14.9, hematocrit 43.7, WBC 5.5, platelets 228. BUN 8, creatinine 0.85, EGFR 96 Sodium 141, potassium 4.3 Alkaline phosphatase mildly elevated 123. Normal transaminases. Hemoglobin A1c 5.9 suggest prediabetes.  Past Medical History:  Diagnosis Date   Acute alteration in mental status 03/14/2023   Alcohol abuse    Alcohol withdrawal (HCC) 03/14/2023   Aortic stenosis 04/25/2023   Aortic stenosis, severe    CAP (community acquired pneumonia) 04/25/2023   Chronic alcohol abuse    Delirium tremens (HCC) 04/23/2023   Elevated troponin 04/24/2023   Generalized weakness 03/14/2023   GERD (gastroesophageal reflux disease) 04/23/2023   Heart murmur on physical examination    Heart murmur, systolic 10/09/2022   History of alcohol use disorder 03/14/2023   History of cerebral infarction    Hyperlipidemia    Hypertension    Hypokalemia 03/14/2023   Leukocytosis 04/23/2023   Metabolic encephalopathy 04/23/2023   Myocardial injury 04/23/2023   Overweight with body mass index (BMI) of 25 to 25.9 in adult    Slurred speech 04/28/2023   Stage 3 chronic kidney disease, unspecified whether stage 3a or 3b CKD (HCC)    Thrombocytopenia (HCC) 03/14/2023   Tremor    Vitamin D deficiency     History reviewed. No pertinent surgical history.  Current Medications: Current Meds  Medication Sig   acetaminophen (TYLENOL) 325 MG tablet Take 2 tablets (650 mg total) by mouth every 6 (six) hours as needed for moderate pain (pain score 4-6) (headache).   aspirin EC 81 MG tablet Take 1 tablet (81 mg total) by mouth daily. Swallow whole.   bisacodyl (DULCOLAX) 10 MG suppository Place 1 suppository (10 mg total) rectally daily as needed for moderate constipation.   carvedilol (COREG) 6.25 MG tablet Take 1 tablet (6.25 mg total) by mouth 2 (two) times daily  with a meal.   cyanocobalamin 1000 MCG tablet Take 1 tablet (1,000 mcg total) by mouth daily.   famotidine (PEPCID) 20 MG tablet Take 1 tablet (20 mg total) by mouth daily.   folic acid (FOLVITE) 1 MG tablet Take 1 tablet (1 mg total) by mouth daily.   Multiple Vitamin (MULTIVITAMIN WITH MINERALS) TABS tablet Take 1 tablet by mouth daily.   polyethylene glycol (MIRALAX / GLYCOLAX) 17 g packet Take 17 g by mouth daily.   thiamine (VITAMIN B-1) 100 MG tablet Take 1 tablet (100 mg total) by mouth daily.   thiamine (VITAMIN B1) 100 MG tablet Take 100 mg by mouth daily.   Vitamin D, Ergocalciferol, (DRISDOL) 1.25 MG (50000 UNIT) CAPS capsule Take 50,000 Units by mouth once a week.     Allergies:   Patient has no known allergies.   Social History   Socioeconomic History   Marital status: Single    Spouse name: Not on file   Number of children: Not on file   Years of education: Not on file   Highest education level: Not on file  Occupational History   Not on file  Tobacco Use   Smoking status: Never   Smokeless tobacco: Never  Vaping Use   Vaping status: Never Used  Substance and Sexual Activity   Alcohol use: Yes    Comment: 1 pint/day   Drug use: No   Sexual activity: Not Currently  Other Topics Concern   Not on file  Social History Narrative   Not on file   Social Drivers of Health   Financial Resource Strain: Not on file  Food Insecurity: No Food Insecurity (04/26/2023)   Hunger Vital Sign    Worried About Running Out of Food in the Last Year: Never true    Ran Out of Food in the Last Year: Never true  Transportation Needs: No Transportation Needs (04/26/2023)   PRAPARE - Administrator, Civil Service (Medical): No    Lack of Transportation (Non-Medical): No  Physical Activity: Not on file  Stress: Not on file  Social Connections: Not on file     Family History: The patient's family history includes Diabetes in his mother; Heart attack in his brother and  sister; Hyperlipidemia in his mother; Hypertension in his father and mother. ROS:   Please see the history of present illness.    All 14 point review of systems negative except as described per history of present illness.  EKGs/Labs/Other Studies Reviewed:    The following studies were reviewed today:   EKG:       Recent Labs: 04/23/2023: TSH 0.975 04/24/2023: B Natriuretic Peptide 209.0 05/01/2023: ALT 16; Magnesium 1.9 05/02/2023: Hemoglobin 12.1; Platelets 327 05/05/2023: BUN 13; Creatinine, Ser 0.79; Potassium 3.7; Sodium 135  Recent Lipid Panel    Component Value Date/Time   CHOL 187 04/23/2023 1724   TRIG 79 04/23/2023 1724   HDL 52 04/23/2023 1724   CHOLHDL 3.6 04/23/2023 1724   VLDL 16 04/23/2023 1724   LDLCALC 119 (H) 04/23/2023 1724    Physical Exam:    VS:  BP 136/68   Pulse 70   Ht 5\' 6"  (1.676 m)   Wt 154 lb 6.4 oz (70 kg)   SpO2 98%   BMI 24.92 kg/m     Wt Readings from Last 3 Encounters:  08/03/23 154 lb 6.4 oz (70 kg)  04/26/23 139 lb 5.3 oz (63.2 kg)  03/23/23 146 lb 3.2 oz (66.3 kg)     GENERAL:  Well nourished, well developed in no acute distress NECK: No JVD; No carotid bruits CARDIAC: RRR, S1 and S2 present, loud 5/6 harsh ejection systolic murmur heard all over the precordium, best heard in right upper third intercostal space. CHEST:  Clear to auscultation without rales, wheezing or rhonchi  Extremities: No pitting pedal edema. Pulses bilaterally symmetric with radial 2+ and dorsalis pedis 2+ NEUROLOGIC:  Alert and oriented x 3  Medication Adjustments/Labs and Tests Ordered: Current medicines are reviewed at length with the patient today.  Concerns regarding medicines are outlined above.  Orders Placed This Encounter  Procedures   Basic Metabolic Panel (BMET)   CBC   EKG 12-Lead   No orders of the defined types were placed in this encounter.   Signed, Cecille Amsterdam, MD, MPH, [facility]. 08/03/2023 11:00 AM      [facility]

## 2023-08-03 NOTE — H&P (View-Only) (Signed)
 Cardiology Consultation:    Date:  08/03/2023   ID:  Steve Kelly, DOB 13-Sep-1955, MRN [id number]  PCP:  Allen, [place], NP  Cardiologist:  Marlyn Corporal Kimm Sider, MD   Referring MD: Allen, [place], NP   No chief complaint on file.    ASSESSMENT AND PLAN:   Mr. Steve Kelly 68 year old with history of severe aortic stenosis, hypertension, alcohol abuse, former smoker, prediabetes, GERD, episodes of delirium tremens in the past and more recently altered mentation attributed to delirium tremens in December 2024 subsequently at skilled nursing facility for couple months and now living with his brother here for follow-up after his initial visit with me in November 2024.  Problem List Items Addressed This Visit     Aortic stenosis, severe - Primary   Severe aortic stenosis. Relatively asymptomatic at this time.  Echocardiogram December 2024 while inpatient [facility] noted V-max 4.1 m/s, mean gradient 40 mmHg.  Discussed pathophysiology and further management possibly for aortic valve replacement by surgical or TAVR approach. Discussed these methods at length.  Discussed prior workup with cardiac catheterization and structural heart team evaluation.  Shared Decision Making/Informed Consent{ The risks [stroke (1 in 1000), death (1 in 1000), kidney failure [usually temporary] (1 in 500), bleeding (1 in 200), allergic reaction [possibly serious] (1 in 200)], benefits (diagnostic support and management of coronary artery disease) and alternatives of a cardiac catheterization were discussed in detail with him and his brother and he is willing to proceed.   Will schedule tentatively for cardiac cath at [facility] in the next few days.  Advised him to avoid any moderate to heavy physical exertional activities. If he does have any significant symptoms of chest pain or shortness of breath to proceed to the ER or call 911.  Advised him to aspirin 81 mg once daily Advised him to take  carvedilol 2 times a day as prescribed compared to his current to 6.25 mg once a day which has been taking.       Relevant Orders   Basic Metabolic Panel (BMET)   CBC   EKG 12-Lead (Completed)      History of Present Illness:    Steve Kelly is a 68 y.o. male who is being seen today for follow-up visit. Last visit with me in the office was 03-23-2023. PCP is Allen, [place], NP, recent follow-up with PCP 07-28-2023 at [facility].  He for the visit today accompanied by his brother.  Has a history of severe aortic stenosis, hypertension with alcohol abuse, former smoker, prediabetes, GERD,  episodes of delirium tremens in the past.  At last visit with me in November 2024 he was here to establish care in the setting of systolic murmur noted by PCP.  Echocardiogram was ordered.  Before this could be completed he was admitted to the hospital [phone] and remained inpatient until 05-07-2023 for altered mentation in the setting of delirium tremens.  High-sensitivity troponins were elevated downtrending 118, 117, 95, 98.  Echocardiogram during inpatient stay 04-25-2023 confirmed severe aortic stenosis with V-max 4.1 m/s and mean gradient 40 mmHg, normal biventricular function with LVEF 55 to 60% and cardiology inpatient evaluation was done by Dr. Brion Aliment and given his altered mentation felt not to be candidate for inpatient workup and recommended outpatient follow-up with cardiology.  Subsequently was discharged to skilled nursing facility.  Pleasant man here for the visit by himself.  Mentions he was discharged home about a month ago and has been living  with his brother since. Has home physical therapy going on.  Denies any chest pain or shortness of breath.  Denies any passing out episodes. EKG in the clinic today shows sinus rhythm heart rate 65/min, PR interval normal 132 ms, QRS morphology consistent with LVH criteria.  Nonspecific T wave changes in inferior  leads.  Blood work available from PCPs office notes reviewed Show lipid panel total cholesterol 172, triglycerides 100, HDL 48, LDL 106. Hemoglobin 14.9, hematocrit 43.7, WBC 5.5, platelets 228. BUN 8, creatinine 0.85, EGFR 96 Sodium 141, potassium 4.3 Alkaline phosphatase mildly elevated 123. Normal transaminases. Hemoglobin A1c 5.9 suggest prediabetes.  Past Medical History:  Diagnosis Date   Acute alteration in mental status 03/14/2023   Alcohol abuse    Alcohol withdrawal (HCC) 03/14/2023   Aortic stenosis 04/25/2023   Aortic stenosis, severe    CAP (community acquired pneumonia) 04/25/2023   Chronic alcohol abuse    Delirium tremens (HCC) 04/23/2023   Elevated troponin 04/24/2023   Generalized weakness 03/14/2023   GERD (gastroesophageal reflux disease) 04/23/2023   Heart murmur on physical examination    Heart murmur, systolic 10/09/2022   History of alcohol use disorder 03/14/2023   History of cerebral infarction    Hyperlipidemia    Hypertension    Hypokalemia 03/14/2023   Leukocytosis 04/23/2023   Metabolic encephalopathy 04/23/2023   Myocardial injury 04/23/2023   Overweight with body mass index (BMI) of 25 to 25.9 in adult    Slurred speech 04/28/2023   Stage 3 chronic kidney disease, unspecified whether stage 3a or 3b CKD (HCC)    Thrombocytopenia (HCC) 03/14/2023   Tremor    Vitamin D deficiency     History reviewed. No pertinent surgical history.  Current Medications: Current Meds  Medication Sig   acetaminophen (TYLENOL) 325 MG tablet Take 2 tablets (650 mg total) by mouth every 6 (six) hours as needed for moderate pain (pain score 4-6) (headache).   aspirin EC 81 MG tablet Take 1 tablet (81 mg total) by mouth daily. Swallow whole.   bisacodyl (DULCOLAX) 10 MG suppository Place 1 suppository (10 mg total) rectally daily as needed for moderate constipation.   carvedilol (COREG) 6.25 MG tablet Take 1 tablet (6.25 mg total) by mouth 2 (two) times daily  with a meal.   cyanocobalamin 1000 MCG tablet Take 1 tablet (1,000 mcg total) by mouth daily.   famotidine (PEPCID) 20 MG tablet Take 1 tablet (20 mg total) by mouth daily.   folic acid (FOLVITE) 1 MG tablet Take 1 tablet (1 mg total) by mouth daily.   Multiple Vitamin (MULTIVITAMIN WITH MINERALS) TABS tablet Take 1 tablet by mouth daily.   polyethylene glycol (MIRALAX / GLYCOLAX) 17 g packet Take 17 g by mouth daily.   thiamine (VITAMIN B-1) 100 MG tablet Take 1 tablet (100 mg total) by mouth daily.   thiamine (VITAMIN B1) 100 MG tablet Take 100 mg by mouth daily.   Vitamin D, Ergocalciferol, (DRISDOL) 1.25 MG (50000 UNIT) CAPS capsule Take 50,000 Units by mouth once a week.     Allergies:   Patient has no known allergies.   Social History   Socioeconomic History   Marital status: Single    Spouse name: Not on file   Number of children: Not on file   Years of education: Not on file   Highest education level: Not on file  Occupational History   Not on file  Tobacco Use   Smoking status: Never   Smokeless tobacco: Never  Vaping Use   Vaping status: Never Used  Substance and Sexual Activity   Alcohol use: Yes    Comment: 1 pint/day   Drug use: No   Sexual activity: Not Currently  Other Topics Concern   Not on file  Social History Narrative   Not on file   Social Drivers of Health   Financial Resource Strain: Not on file  Food Insecurity: No Food Insecurity (04/26/2023)   Hunger Vital Sign    Worried About Running Out of Food in the Last Year: Never true    Ran Out of Food in the Last Year: Never true  Transportation Needs: No Transportation Needs (04/26/2023)   PRAPARE - Administrator, Civil Service (Medical): No    Lack of Transportation (Non-Medical): No  Physical Activity: Not on file  Stress: Not on file  Social Connections: Not on file     Family History: The patient's family history includes Diabetes in his mother; Heart attack in his brother and  sister; Hyperlipidemia in his mother; Hypertension in his father and mother. ROS:   Please see the history of present illness.    All 14 point review of systems negative except as described per history of present illness.  EKGs/Labs/Other Studies Reviewed:    The following studies were reviewed today:   EKG:       Recent Labs: 04/23/2023: TSH 0.975 04/24/2023: B Natriuretic Peptide 209.0 05/01/2023: ALT 16; Magnesium 1.9 05/02/2023: Hemoglobin 12.1; Platelets 327 05/05/2023: BUN 13; Creatinine, Ser 0.79; Potassium 3.7; Sodium 135  Recent Lipid Panel    Component Value Date/Time   CHOL 187 04/23/2023 1724   TRIG 79 04/23/2023 1724   HDL 52 04/23/2023 1724   CHOLHDL 3.6 04/23/2023 1724   VLDL 16 04/23/2023 1724   LDLCALC 119 (H) 04/23/2023 1724    Physical Exam:    VS:  BP 136/68   Pulse 70   Ht 5\' 6"  (1.676 m)   Wt 154 lb 6.4 oz (70 kg)   SpO2 98%   BMI 24.92 kg/m     Wt Readings from Last 3 Encounters:  08/03/23 154 lb 6.4 oz (70 kg)  04/26/23 139 lb 5.3 oz (63.2 kg)  03/23/23 146 lb 3.2 oz (66.3 kg)     GENERAL:  Well nourished, well developed in no acute distress NECK: No JVD; No carotid bruits CARDIAC: RRR, S1 and S2 present, loud 5/6 harsh ejection systolic murmur heard all over the precordium, best heard in right upper third intercostal space. CHEST:  Clear to auscultation without rales, wheezing or rhonchi  Extremities: No pitting pedal edema. Pulses bilaterally symmetric with radial 2+ and dorsalis pedis 2+ NEUROLOGIC:  Alert and oriented x 3  Medication Adjustments/Labs and Tests Ordered: Current medicines are reviewed at length with the patient today.  Concerns regarding medicines are outlined above.  Orders Placed This Encounter  Procedures   Basic Metabolic Panel (BMET)   CBC   EKG 12-Lead   No orders of the defined types were placed in this encounter.   Signed, Cecille Amsterdam, MD, MPH, [facility]. 08/03/2023 11:00 AM      [facility]

## 2023-08-03 NOTE — Assessment & Plan Note (Addendum)
 Severe aortic stenosis. Relatively asymptomatic at this time.  Echocardiogram December 2024 while inpatient [facility] noted V-max 4.1 m/s, mean gradient 40 mmHg.  Discussed pathophysiology and further management possibly for aortic valve replacement by surgical or TAVR approach. Discussed these methods at length.  Discussed prior workup with cardiac catheterization and structural heart team evaluation.  Shared Decision Making/Informed Consent{ The risks [stroke (1 in 1000), death (1 in 1000), kidney failure [usually temporary] (1 in 500), bleeding (1 in 200), allergic reaction [possibly serious] (1 in 200)], benefits (diagnostic support and management of coronary artery disease) and alternatives of a cardiac catheterization were discussed in detail with him and his brother and he is willing to proceed.   Will schedule tentatively for cardiac cath at [facility] in the next few days.  Advised him to avoid any moderate to heavy physical exertional activities. If he does have any significant symptoms of chest pain or shortness of breath to proceed to the ER or call 911.  Advised him to aspirin 81 mg once daily Advised him to take carvedilol 2 times a day as prescribed compared to his current to 6.25 mg once a day which has been taking.

## 2023-08-04 LAB — BASIC METABOLIC PANEL
BUN/Creatinine Ratio: 9 — ABNORMAL LOW (ref 10–24)
BUN: 9 mg/dL (ref 8–27)
CO2: 22 mmol/L (ref 20–29)
Calcium: 10.3 mg/dL — ABNORMAL HIGH (ref 8.6–10.2)
Chloride: 102 mmol/L (ref 96–106)
Creatinine, Ser: 0.95 mg/dL (ref 0.76–1.27)
Glucose: 88 mg/dL (ref 70–99)
Potassium: 4.6 mmol/L (ref 3.5–5.2)
Sodium: 141 mmol/L (ref 134–144)
eGFR: 89 mL/min/{1.73_m2} (ref >59)

## 2023-08-04 LAB — CBC
Hematocrit: 45.5 % (ref 37.5–51.0)
Hemoglobin: 15.3 g/dL (ref 13.0–17.7)
MCH: 31 pg (ref 26.6–33.0)
MCHC: 33.6 g/dL (ref 31.5–35.7)
MCV: 92 fL (ref 79–97)
Platelets: 223 10*3/uL (ref 150–450)
RBC: 4.94 x10E6/uL (ref 4.14–5.80)
RDW: 14.4 % (ref 11.6–15.4)
WBC: 5.7 10*3/uL (ref 3.4–10.8)

## 2023-08-09 ENCOUNTER — Telehealth: Payer: Self-pay | Admitting: *Deleted

## 2023-08-09 NOTE — Telephone Encounter (Signed)
 Cardiac Catheterization scheduled at [facility] for: Tuesday August 10, 2023 12 Noon Arrival time [facility] Main Entrance A at: 10 AM  Nothing to eat after midnight prior to procedure, clear liquids until 5 AM day of procedure.  Medication instructions: -Usual morning medications can be taken with sips of water including aspirin 81 mg.  Plan to go home the same day, you will only stay overnight if medically necessary.  You must have responsible adult to drive you home.  Someone must be with you the first 24 hours after you arrive home.  Reviewed procedure instructions with patient.

## 2023-08-10 ENCOUNTER — Encounter (HOSPITAL_COMMUNITY)
Admission: RE | Disposition: A | Discharge status: Home / Self Care | Payer: Self-pay | Source: Home / Self Care | Attending: Cardiovascular Disease

## 2023-08-10 ENCOUNTER — Ambulatory Visit (HOSPITAL_COMMUNITY)
Admission: RE | Admit: 2023-08-10 | Discharge: 2023-08-10 | Disposition: A | Discharge status: Home / Self Care | Attending: Cardiovascular Disease | Admitting: Cardiovascular Disease

## 2023-08-10 ENCOUNTER — Other Ambulatory Visit: Payer: Self-pay

## 2023-08-10 DIAGNOSIS — Z7982 Long term (current) use of aspirin: Secondary | ICD-10-CM | POA: Diagnosis not present

## 2023-08-10 DIAGNOSIS — Z79899 Other long term (current) drug therapy: Secondary | ICD-10-CM | POA: Insufficient documentation

## 2023-08-10 DIAGNOSIS — R7303 Prediabetes: Secondary | ICD-10-CM | POA: Diagnosis not present

## 2023-08-10 DIAGNOSIS — I10 Essential (primary) hypertension: Secondary | ICD-10-CM | POA: Diagnosis not present

## 2023-08-10 DIAGNOSIS — I350 Nonrheumatic aortic (valve) stenosis: Secondary | ICD-10-CM | POA: Diagnosis not present

## 2023-08-10 DIAGNOSIS — K219 Gastro-esophageal reflux disease without esophagitis: Secondary | ICD-10-CM | POA: Diagnosis not present

## 2023-08-10 DIAGNOSIS — Z87891 Personal history of nicotine dependence: Secondary | ICD-10-CM | POA: Insufficient documentation

## 2023-08-10 HISTORY — PX: RIGHT HEART CATH AND CORONARY ANGIOGRAPHY: CATH118264

## 2023-08-10 LAB — POCT I-STAT 7, (LYTES, BLD GAS, ICA,H+H)
Acid-base deficit: 1 mmol/L (ref 0.0–2.0)
Acid-base deficit: 13 mmol/L — ABNORMAL HIGH (ref 0.0–2.0)
Bicarbonate: 13.8 mmol/L — ABNORMAL LOW (ref 20.0–28.0)
Bicarbonate: 23.2 mmol/L (ref 20.0–28.0)
Calcium, Ion: 0.68 mmol/L — CL (ref 1.15–1.40)
Calcium, Ion: 1.24 mmol/L (ref 1.15–1.40)
HCT: 40 % (ref 39.0–52.0)
HCT: 41 % (ref 39.0–52.0)
Hemoglobin: 13.6 g/dL (ref 13.0–17.0)
Hemoglobin: 13.9 g/dL (ref 13.0–17.0)
O2 Saturation: 61 %
O2 Saturation: 94 %
Potassium: 2 mmol/L — CL (ref 3.5–5.1)
Potassium: 3.6 mmol/L (ref 3.5–5.1)
Sodium: 116 mmol/L — CL (ref 135–145)
Sodium: 140 mmol/L (ref 135–145)
TCO2: 15 mmol/L — ABNORMAL LOW (ref 22–32)
TCO2: 24 mmol/L (ref 22–32)
pCO2 arterial: 35.2 mmHg (ref 32–48)
pCO2 arterial: 36.2 mmHg (ref 32–48)
pH, Arterial: 7.201 — ABNORMAL LOW (ref 7.35–7.45)
pH, Arterial: 7.414 (ref 7.35–7.45)
pO2, Arterial: 38 mmHg — CL (ref 83–108)
pO2, Arterial: 69 mmHg — ABNORMAL LOW (ref 83–108)

## 2023-08-10 SURGERY — RIGHT HEART CATH AND CORONARY ANGIOGRAPHY
Anesthesia: LOCAL

## 2023-08-10 MED ORDER — VERAPAMIL HCL 2.5 MG/ML IV SOLN
INTRAVENOUS | Status: DC | PRN
  Administered 2023-08-10: 8 mL via INTRA_ARTERIAL

## 2023-08-10 MED ORDER — SODIUM CHLORIDE 0.9 % IV SOLN: 250.0000 mL | INTRAVENOUS | Status: DC | PRN

## 2023-08-10 MED ORDER — HEPARIN (PORCINE) IN NACL 1000-0.9 UT/500ML-% IV SOLN
INTRAVENOUS | Status: DC | PRN
  Administered 2023-08-10: 1000 mL

## 2023-08-10 MED ORDER — ACETAMINOPHEN 325 MG PO TABS: 650.0000 mg | ORAL_TABLET | ORAL | Status: DC | PRN

## 2023-08-10 MED ORDER — FENTANYL CITRATE (PF) 100 MCG/2ML IJ SOLN
INTRAMUSCULAR | Status: DC | PRN
  Administered 2023-08-10: 25 ug via INTRAVENOUS

## 2023-08-10 MED ORDER — VERAPAMIL HCL 2.5 MG/ML IV SOLN
INTRAVENOUS | Status: AC
  Filled 2023-08-10: qty 2

## 2023-08-10 MED ORDER — SODIUM CHLORIDE 0.9% FLUSH: 3.0000 mL | INTRAVENOUS | Status: DC | PRN

## 2023-08-10 MED ORDER — SODIUM CHLORIDE 0.9 % WEIGHT BASED INFUSION: 3.0000 mL/kg/h | INTRAVENOUS | Status: AC

## 2023-08-10 MED ORDER — MIDAZOLAM HCL 2 MG/2ML IJ SOLN
INTRAMUSCULAR | Status: DC | PRN
  Administered 2023-08-10: 1 mg via INTRAVENOUS

## 2023-08-10 MED ORDER — HYDRALAZINE HCL 20 MG/ML IJ SOLN: 10.0000 mg | INTRAMUSCULAR | Status: DC | PRN

## 2023-08-10 MED ORDER — LIDOCAINE HCL (PF) 1 % IJ SOLN
INTRAMUSCULAR | Status: DC | PRN
  Administered 2023-08-10: 2 mL

## 2023-08-10 MED ORDER — LIDOCAINE HCL (PF) 1 % IJ SOLN
INTRAMUSCULAR | Status: AC
  Filled 2023-08-10: qty 30

## 2023-08-10 MED ORDER — ONDANSETRON HCL 4 MG/2ML IJ SOLN: 4.0000 mg | Freq: Four times a day (QID) | INTRAMUSCULAR | Status: DC | PRN

## 2023-08-10 MED ORDER — SODIUM CHLORIDE 0.9% FLUSH: 3.0000 mL | Freq: Two times a day (BID) | INTRAVENOUS | Status: DC

## 2023-08-10 MED ORDER — SODIUM CHLORIDE 0.9 % IV SOLN: INTRAVENOUS | Status: AC

## 2023-08-10 MED ORDER — LABETALOL HCL 5 MG/ML IV SOLN
10.0000 mg | INTRAVENOUS | Status: DC | PRN
  Administered 2023-08-10: 10 mg via INTRAVENOUS
  Filled 2023-08-10: qty 4

## 2023-08-10 MED ORDER — MIDAZOLAM HCL 2 MG/2ML IJ SOLN
INTRAMUSCULAR | Status: AC
  Filled 2023-08-10: qty 2

## 2023-08-10 MED ORDER — FENTANYL CITRATE (PF) 100 MCG/2ML IJ SOLN
INTRAMUSCULAR | Status: AC
  Filled 2023-08-10: qty 2

## 2023-08-10 MED ORDER — SODIUM CHLORIDE 0.9 % WEIGHT BASED INFUSION
1.0000 mL/kg/h | INTRAVENOUS | Status: DC
Start: 2023-08-10 — End: 2023-08-10

## 2023-08-10 MED ORDER — HEPARIN SODIUM (PORCINE) 1000 UNIT/ML IJ SOLN
INTRAMUSCULAR | Status: DC | PRN
  Administered 2023-08-10: 3500 [IU] via INTRAVENOUS

## 2023-08-10 SURGICAL SUPPLY — 9 items
CATH 5FR JL3.5 JR4 ANG PIG MP (CATHETERS) IMPLANT
CATH BALLN WEDGE 5F 110CM (CATHETERS) IMPLANT
DEVICE RAD COMP TR BAND LRG (VASCULAR PRODUCTS) IMPLANT
GLIDESHEATH SLEND SS 6F .021 (SHEATH) IMPLANT
GUIDEWIRE INQWIRE 1.5J.035X260 (WIRE) IMPLANT
INQWIRE 1.5J .035X260CM (WIRE) ×1 IMPLANT
PACK CARDIAC CATHETERIZATION (CUSTOM PROCEDURE TRAY) ×1 IMPLANT
SET ATX-X65L (MISCELLANEOUS) IMPLANT
SHEATH GLIDE SLENDER 4/5FR (SHEATH) IMPLANT

## 2023-08-10 NOTE — Interval H&P Note (Signed)
 History and Physical Interval Note:  08/10/2023 1:05 PM  Steve Kelly  has presented today for surgery, with the diagnosis of aortic stenosis.  The various methods of treatment have been discussed with the patient and family. After consideration of risks, benefits and other options for treatment, the patient has consented to  Procedure(s): RIGHT/LEFT HEART CATH AND CORONARY ANGIOGRAPHY (N/A) as a surgical intervention.  The patient's history has been reviewed, patient examined, no change in status, stable for surgery.  I have reviewed the patient's chart and labs.  Questions were answered to the patient's satisfaction.    Cath Lab Visit (complete for each Cath Lab visit)  Clinical Evaluation Leading to the Procedure:   ACS: No.  Non-ACS:    Anginal Classification: No Symptoms  Anti-ischemic medical therapy: Minimal Therapy (1 class of medications)  Non-Invasive Test Results: No non-invasive testing performed  Prior CABG: No previous CABG        Verne Carrow

## 2023-08-11 ENCOUNTER — Encounter (HOSPITAL_COMMUNITY): Payer: Self-pay | Admitting: Cardiovascular Disease

## 2023-08-12 NOTE — Progress Notes (Unsigned)
 Structural Heart Clinic Consult Note  No chief complaint on file.  History of Present Illness: 68 yo male with history of etoh abuse, former tobacco abuse, delirium tremens, GERD, prior CVA, HLD, HTN, CKD stage 3 and severe aortic stenosis who is here today as a referral by Dr. Vincent Gros for further discussion regarding his aortic stenosis and AVR vs TAVR. Echo December 2024 with LVEF=55-60%. Severe aortic stenosis with mean gradient 40 mmHg, AVA 0.63 cm2, SVI 32, DI 0.27. Cardiac cath April 2025 with no evidence of CAD. Normal right heart pressures.   He tells me today that he He lives in *** He is retired Engineer, manufacturing ***  Primary Care Physician: Allen, [place], NP Primary Cardiologist: Madireddy Referring Cardiologist: Madireddy  Past Medical History:  Diagnosis Date   Acute alteration in mental status 03/14/2023   Alcohol abuse    Alcohol withdrawal (HCC) 03/14/2023   Aortic stenosis 04/25/2023   Aortic stenosis, severe    CAP (community acquired pneumonia) 04/25/2023   Chronic alcohol abuse    Delirium tremens (HCC) 04/23/2023   Elevated troponin 04/24/2023   Generalized weakness 03/14/2023   GERD (gastroesophageal reflux disease) 04/23/2023   Heart murmur on physical examination    Heart murmur, systolic 10/09/2022   History of alcohol use disorder 03/14/2023   History of cerebral infarction    Hyperlipidemia    Hypertension    Hypokalemia 03/14/2023   Leukocytosis 04/23/2023   Metabolic encephalopathy 04/23/2023   Myocardial injury 04/23/2023   Overweight with body mass index (BMI) of 25 to 25.9 in adult    Slurred speech 04/28/2023   Stage 3 chronic kidney disease, unspecified whether stage 3a or 3b CKD (HCC)    Thrombocytopenia (HCC) 03/14/2023   Tremor    Vitamin D deficiency     Past Surgical History:  Procedure Laterality Date   RIGHT HEART CATH AND CORONARY ANGIOGRAPHY N/A 08/10/2023   Procedure: RIGHT HEART CATH AND CORONARY ANGIOGRAPHY;  Surgeon:  Kathleene Hazel, MD;  Location: [facility] INVASIVE CV LAB;  Service: Cardiovascular;  Laterality: N/A;    Current Outpatient Medications  Medication Sig Dispense Refill   acetaminophen (TYLENOL) 500 MG tablet Take 500 mg by mouth every 6 (six) hours as needed (pain.).     aspirin EC 81 MG tablet Take 1 tablet (81 mg total) by mouth daily. Swallow whole.     carvedilol (COREG) 6.25 MG tablet Take 1 tablet (6.25 mg total) by mouth 2 (two) times daily with a meal.     famotidine (PEPCID) 20 MG tablet Take 1 tablet (20 mg total) by mouth daily.     Vitamin D, Ergocalciferol, (DRISDOL) 1.25 MG (50000 UNIT) CAPS capsule Take 50,000 Units by mouth every Wednesday.     No current facility-administered medications for this visit.    No Known Allergies  Social History   Socioeconomic History   Marital status: Single    Spouse name: Not on file   Number of children: Not on file   Years of education: Not on file   Highest education level: Not on file  Occupational History   Not on file  Tobacco Use   Smoking status: Never   Smokeless tobacco: Never  Vaping Use   Vaping status: Never Used  Substance and Sexual Activity   Alcohol use: Yes    Comment: 1 pint/day   Drug use: No   Sexual activity: Not Currently  Other Topics Concern   Not on file  Social History Narrative  Not on file   Social Drivers of Health   Financial Resource Strain: Not on file  Food Insecurity: No Food Insecurity (04/26/2023)   Hunger Vital Sign    Worried About Running Out of Food in the Last Year: Never true    Ran Out of Food in the Last Year: Never true  Transportation Needs: No Transportation Needs (04/26/2023)   PRAPARE - Administrator, Civil Service (Medical): No    Lack of Transportation (Non-Medical): No  Physical Activity: Not on file  Stress: Not on file  Social Connections: Not on file  Intimate Partner Violence: Not At Risk (04/26/2023)   Humiliation, Afraid, Rape, and Kick  questionnaire    Fear of Current or Ex-Partner: No    Emotionally Abused: No    Physically Abused: No    Sexually Abused: No    Family History  Problem Relation Age of Onset   Diabetes Mother    Hypertension Mother    Hyperlipidemia Mother    Hypertension Father    Heart attack Sister    Heart attack Brother     Review of Systems:  As stated in the HPI and otherwise negative.   There were no vitals taken for this visit.  Physical Examination: General: Well developed, well nourished, NAD  HEENT: OP clear, mucus membranes moist  SKIN: warm, dry. No rashes. Neuro: No focal deficits  Musculoskeletal: Muscle strength 5/5 all ext  Psychiatric: Mood and affect normal  Neck: No JVD, no carotid bruits, no thyromegaly, no lymphadenopathy.  Lungs:Clear bilaterally, no wheezes, rhonci, crackles Cardiovascular: Regular rate and rhythm. *** Loud, harsh, late peaking systolic murmur.  Abdomen:Soft. Bowel sounds present. Non-tender.  Extremities: *** No lower extremity edema. Pulses are 2 + in the bilateral DP/PT.  EKG:  EKG {ACTION; IS/IS [id number]} ordered today. The ekg ordered today demonstrates ***  Echo 04/25/23:   1. Left ventricular ejection fraction, by estimation, is 55 to 60%. The  left ventricle has normal function. The left ventricle has no regional  wall motion abnormalities. There is mild left ventricular hypertrophy.  Left ventricular diastolic parameters  are consistent with Grade I diastolic dysfunction (impaired relaxation).   2. Right ventricular systolic function is normal. The right ventricular  size is normal.   3. The mitral valve is normal in structure. No evidence of mitral valve  regurgitation.   4. Mean aortic valve gradient , peak gradient , Vmax 4.1 m/s,  AVA 0.74cm2.. The aortic valve is calcified. Aortic valve regurgitation is  not visualized. Severe aortic valve stenosis.   5. The inferior vena cava is normal in size with <50%  respiratory  variability, suggesting right atrial pressure of 8 mmHg.   Conclusion(s)/Recommendation(s): Findings consistent with severe valvular  heart disease.   FINDINGS   Left Ventricle: Left ventricular ejection fraction, by estimation, is 55  to 60%. The left ventricle has normal function. The left ventricle has no  regional wall motion abnormalities. The left ventricular internal cavity  size was normal in size. There is   mild left ventricular hypertrophy. Left ventricular diastolic parameters  are consistent with Grade I diastolic dysfunction (impaired relaxation).   Right Ventricle: The right ventricular size is normal. No increase in  right ventricular wall thickness. Right ventricular systolic function is  normal.   Left Atrium: Left atrial size was normal in size.   Right Atrium: Right atrial size was normal in size.   Pericardium: There is no evidence of pericardial effusion.  Mitral Valve: The mitral valve is normal in structure. No evidence of  mitral valve regurgitation.   Tricuspid Valve: The tricuspid valve is normal in structure. Tricuspid  valve regurgitation is mild.   Aortic Valve: Mean aortic valve gradient , peak gradient ,  Vmax 4.1 m/s, AVA 0.74cm2. The aortic valve is calcified. Aortic valve  regurgitation is not visualized. Aortic regurgitation PHT measures 436  msec. Severe aortic stenosis is present.  Aortic valve mean gradient measures 39.0 mmHg. Aortic valve peak gradient  measures 64.5 mmHg. Aortic valve area, by VTI measures 0.75 cm.   Pulmonic Valve: The pulmonic valve was normal in structure. Pulmonic valve  regurgitation is not visualized.   Aorta: The aortic root and ascending aorta are structurally normal, with  no evidence of dilitation.   Venous: The inferior vena cava is normal in size with less than 50%  respiratory variability, suggesting right atrial pressure of 8 mmHg.   IAS/Shunts: No atrial level shunt  detected by color flow Doppler.     LEFT VENTRICLE  PLAX 2D  LVIDd:         3.80 cm   Diastology  LVIDs:         2.40 cm   LV e' medial:    8.27 cm/s  LV PW:         1.40 cm   LV E/e' medial:  9.1  LV IVS:        1.10 cm   LV e' lateral:   6.53 cm/s  LVOT diam:     1.90 cm   LV E/e' lateral: 11.5  LV SV:         57  LV SV Index:   32  LVOT Area:     2.84 cm     RIGHT VENTRICLE  RV Basal diam:  2.20 cm  RV S prime:     19.30 cm/s  TAPSE (M-mode): 1.8 cm   LEFT ATRIUM           Index        RIGHT ATRIUM           Index  LA diam:      2.80 cm 1.57 cm/m   RA Area:     10.40 cm  LA Vol (A4C): 26.7 ml 14.99 ml/m  RA Volume:   17.70 ml  9.94 ml/m   AORTIC VALVE                     PULMONIC VALVE  AV Area (Vmax):    0.70 cm      PV Vmax:       0.61 m/s  AV Area (Vmean):   0.63 cm      PV Peak grad:  1.5 mmHg  AV Area (VTI):     0.75 cm  AV Vmax:           401.50 cm/s  AV Vmean:          291.500 cm/s  AV VTI:            0.755 m  AV Peak Grad:      64.5 mmHg  AV Mean Grad:      39.0 mmHg  LVOT Vmax:         99.80 cm/s  LVOT Vmean:        64.500 cm/s  LVOT VTI:          0.201 m  LVOT/AV VTI ratio: 0.27  AI PHT:  436 msec    AORTA  Ao Root diam: 3.40 cm  Ao Asc diam:  3.10 cm   MITRAL VALVE               TRICUSPID VALVE  MV Area (PHT): 4.99 cm    TV Peak grad:   28.0 mmHg  MV Decel Time: 152 msec    TV Vmax:        2.64 m/s  MV E velocity: 74.90 cm/s  MV A velocity: 94.90 cm/s  SHUNTS  MV E/A ratio:  0.79        Systemic VTI:  0.20 m                             Systemic Diam: 1.90 cm   Cardiac cath 08/10/23: ographic evidence of CAD Normal right heart pressures   Recommendations: Will continue workup for TAVR.    Indications  Severe aortic stenosis [I35.0 (ICD-10-CM)]   Procedural Details  Technical Details Indication: 68 yo male with history of etoh abuse, tobacco abuse, HTN and severe aortic stenosis.   Procedure: The risks, benefits,  complications, treatment options, and expected outcomes were discussed with the patient. The patient and/or family concurred with the proposed plan, giving informed consent. The patient was sedated with Versed and Fentanyl. The IV catheter in the right antecubital vein was changed for a 5 [place] sheath. Right heart catheterization performed with a balloon tipped catheter. The right wrist was prepped and draped in a sterile fashion. 1% lidocaine was used for local anesthesia. Using the modified Seldinger access technique, a 5 French sheath was placed in the right radial artery. 3 mg Verapamil was given through the sheath. Weight based IV heparin was given. Standard diagnostic catheters were used to perform selective coronary angiography. I did not attempt to cross the aortic valve. All catheter exchanges were performed over an exchange length guidewire.   The sheath was removed from the right radial artery and a hemostasis band was applied at the arteriotomy site on the right wrist.      Estimated blood loss <50 mL.   During this procedure medications were administered to achieve and maintain moderate conscious sedation while the patient's heart rate, blood pressure, and oxygen saturation were continuously monitored and I was present face-to-face 100% of this time. Mallory Heath RN and Pollie Friar Cardiovascular Specialist  [facility] Cardiovascular Specialist are independent, trained observers who assisted in the monitoring of the patient's level of consciousness.   Medications (Filter: Administrations occurring from 1302 to 1401 on 08/10/23) midazolam (VERSED) injection (mg)  Total dose: 1 mg Date/Time Rate/Dose/Volume Action   08/10/23 1316 1 mg Given   fentaNYL (SUBLIMAZE) injection (mcg)  Total dose: 25 mcg Date/Time Rate/Dose/Volume Action   08/10/23 1317 25 mcg Given   Radial Cocktail/Verapamil only (mL)  Total volume: 8 mL Date/Time Rate/Dose/Volume Action   08/10/23 1331 8  mL Given   heparin sodium (porcine) injection (Units)  Total dose: 3,500 Units Date/Time Rate/Dose/Volume Action   08/10/23 1338 3,500 Units Given   Heparin (Porcine) in NaCl 1000-0.9 UT/500ML-% SOLN (mL)  Total volume: 1,000 mL Date/Time Rate/Dose/Volume Action   08/10/23 1344 1,000 mL Given   lidocaine (PF) (XYLOCAINE) 1 % injection (mL)  Total volume: 2 mL Date/Time Rate/Dose/Volume Action   08/10/23 1357 2 mL Given    Sedation Time  Sedation Time Physician-1: 20 minutes 16 seconds Radiation/Fluoro  Fluoro time: 7.1 (min) DAP: 8.6 (Gycm2)  Cumulative Air Kerma: 147.6 (mGy) Complications  Complications documented before study signed (08/10/2023  2:01 PM)   No complications were associated with this study.  Documented by Phillips Hay - 08/10/2023  1:56 PM     Coronary Findings  Diagnostic Dominance: Right Left Anterior Descending  Vessel is large.    Left Circumflex  Vessel is large.    Right Coronary Artery  Vessel is large.    Intervention   No interventions have been documented.   Coronary Diagrams  Diagnostic Dominance: Right  Intervention   Implants   No implant documentation for this case.   Syngo Images   Show images for CARDIAC CATHETERIZATION Images on Long Term Storage   Show images for Rilley, Stash to Procedure Log  Procedure Log    Hemo Data  Flowsheet Row Most Recent Value  Fick Cardiac Output 3.56 L/min  Fick Cardiac Output Index 1.94 (L/min)/BSA  RA A Wave 11 mmHg  RA V Wave 10 mmHg  RA Mean 10 mmHg  RV Systolic Pressure 35 mmHg  RV Diastolic Pressure 10 mmHg  RV EDP 12 mmHg  PA Systolic Pressure 35 mmHg  PA Diastolic Pressure 18 mmHg  PA Mean 24 mmHg  PW A Wave 17 mmHg  PW V Wave 15 mmHg  PW Mean 16 mmHg  AO Systolic Pressure 159 mmHg  AO Diastolic Pressure 79 mmHg  AO Mean 111 mmHg  QP/QS 1  TPVR Index 12.39 HRUI  TSVR Index 49.57 HRUI  PVR SVR Ratio 0.09  TPVR/TSVR Ratio 0.25    Recent  Labs: 04/23/2023: TSH 0.975 04/24/2023: B Natriuretic Peptide 209.0 05/01/2023: ALT 16; Magnesium 1.9 08/03/2023: BUN 9; Creatinine, Ser 0.95; Platelets 223 08/10/2023: Hemoglobin 13.6; Potassium 2.0; Sodium 116    Wt Readings from Last 3 Encounters:  08/10/23 68.9 kg  08/03/23 70 kg  04/26/23 63.2 kg     Assessment and Plan:   1. Severe Aortic Valve Stenosis: He has severe, stage *** D aortic valve stenosis. He has NYHA class *** symptoms. I have personally reviewed the echo images. The aortic valve is thickened and calcified with limited leaflet mobility. I think he would benefit from AVR. Given advanced age, *** is not a good candidate for conventional AVR by surgical approach. I think *** may be a good candidate for TAVR.   I have reviewed the natural history of aortic stenosis with the patient and their family members  who are present today. We have discussed the limitations of medical therapy and the poor prognosis associated with symptomatic aortic stenosis. We have reviewed potential treatment options, including palliative medical therapy, conventional surgical aortic valve replacement, and transcatheter aortic valve replacement. We discussed treatment options in the context of the patient's specific comorbid medical conditions.   He would like to proceed with planning for TAVR. Risks and benefits of the valve procedure are reviewed with the patient. After the cath, *** will have a cardiac CT, CTA of the chest/abdomen and pelvis and will then be referred to see one of the CT surgeons on our TAVR team.     Labs/ tests ordered today include:  No orders of the defined types were placed in this encounter.    Disposition:   F/U will be arranged with the structural team  Signed, Verne Carrow, MD, [facility] 08/12/2023 3:50 PM    [facility] [address] [place], [place], [place]  [address] Phone: [phone]; Fax: [phone]

## 2023-08-13 ENCOUNTER — Encounter: Payer: Self-pay | Admitting: Cardiovascular Disease

## 2023-08-13 ENCOUNTER — Ambulatory Visit: Attending: Internal Medicine | Admitting: Cardiovascular Disease

## 2023-08-13 VITALS — BP 142/70 | HR 65 | Ht 69.0 in | Wt 158.0 lb

## 2023-08-13 DIAGNOSIS — I350 Nonrheumatic aortic (valve) stenosis: Secondary | ICD-10-CM | POA: Insufficient documentation

## 2023-08-13 NOTE — Patient Instructions (Signed)
 Medication Instructions:  No changes *If you need a refill on your cardiac medications before your next appointment, please call your pharmacy*  Lab Work: none   Testing/Procedures: ECHO IN [place] Your physician has requested that you have an echocardiogram. Echocardiography is a painless test that uses sound waves to create images of your heart. It provides your doctor with information about the size and shape of your heart and how well your heart's chambers and valves are working. This procedure takes approximately one hour. There are no restrictions for this procedure. Please do NOT wear cologne, perfume, aftershave, or lotions (deodorant is allowed). Please arrive 15 minutes prior to your appointment time.  Please note: We ask at that you not bring children with you during ultrasound (echo/ vascular) testing. Due to room size and safety concerns, children are not allowed in the ultrasound rooms during exams. Our front office staff cannot provide observation of children in our lobby area while testing is being conducted. An adult accompanying a patient to their appointment will only be allowed in the ultrasound room at the discretion of the ultrasound technician under special circumstances. We apologize for any inconvenience.   Follow-Up: At [facility], you and your health needs are our priority.  As part of our continuing mission to provide you with exceptional heart care, our providers are all part of one team.  This team includes your primary Cardiologist (physician) and Advanced Practice Providers or APPs (Physician Assistants and Nurse Practitioners) who all work together to provide you with the care you need, when you need it.  Your next appointment:   6 month(s)  Provider:   Verne Carrow, MD  We recommend signing up for the patient portal called "MyChart".  Sign up information is provided on this After Visit Summary.  MyChart is used to connect with  patients for Virtual Visits (Telemedicine).  Patients are able to view lab/test results, encounter notes, upcoming appointments, etc.  Non-urgent messages can be sent to your provider as well.   To learn more about what you can do with MyChart, go to [email or web address].      1st Floor: - Lobby - Registration  - Pharmacy  - Lab - Cafe  2nd Floor: - PV Lab - Diagnostic Testing (echo, CT, nuclear med)  3rd Floor: - Vacant  4th Floor: - TCTS (cardiothoracic surgery) - AFib Clinic - Structural Heart Clinic - Vascular Surgery  - Vascular Ultrasound  5th Floor: - HeartCare Cardiology (general and EP) - Clinical Pharmacy for coumadin, hypertension, lipid, weight-loss medications, and med management appointments    Valet parking services will be available as well.

## 2023-08-16 ENCOUNTER — Ambulatory Visit: Admitting: Internal Medicine

## 2023-09-03 ENCOUNTER — Ambulatory Visit

## 2023-09-03 VITALS — BP 160/90 | HR 60 | Ht 69.0 in | Wt 160.6 lb

## 2023-09-03 DIAGNOSIS — I10 Essential (primary) hypertension: Secondary | ICD-10-CM | POA: Insufficient documentation

## 2023-09-03 DIAGNOSIS — I350 Nonrheumatic aortic (valve) stenosis: Secondary | ICD-10-CM | POA: Diagnosis present

## 2023-09-03 MED ORDER — AMLODIPINE BESYLATE 2.5 MG PO TABS: 2.5000 mg | ORAL_TABLET | Freq: Every day | ORAL | 3 refills | Status: AC

## 2023-09-03 NOTE — Assessment & Plan Note (Signed)
 Severe. Now established care with structural heart team and has seen Dr. Abel Hoe who plans to follow-up with him in 6 months and sooner follow-up if he gets symptomatic. With plan for aortic valve replacement by SAVR versus TAVR when indicated.  I reviewed with patient extensively about high risk for morbidity and mortality with aortic stenosis.  If he does report any symptoms of chest pain, shortness of breath, lightheadedness, dizziness to let us  know promptly for expedited evaluation.  He acknowledges and mentions he will reach out to our daughter structural heart team right away.

## 2023-09-03 NOTE — Progress Notes (Signed)
 Cardiology Consultation:    Date:  09/03/2023   ID:  Steve Kelly, DOB 01/17/1956, MRN [id number]  PCP:  Allen, [place], NP  Cardiologist:  Daymon Evans Keoki Mchargue, MD   Referring MD: Allen, [place], NP   No chief complaint on file.    ASSESSMENT AND PLAN:   [name] 68 year old male patient with history of severe aortic stenosis, no angiographic evidence of coronary artery disease on cardiac cath 08-10-2023, hypertension, alcohol abuse, former smoker, prediabetes, GERD, CKD stage III, episodes of delirium tremens in the past, here for follow-up after cardiac cath done recent for workup in the setting of aortic stenosis. Has establish care with structural heart team and has seen Dr. Abel Hoe, and given his relative asymptomatic state was recommended to follow-up tentatively in 6 months with options for aortic valve replacement by surgical versus TAVR on the table.  Problem List Items Addressed This Visit     Hypertension   Blood pressures elevated. Target below 130/80 mmHg. Continue current dose carvedilol  6.25 mg twice daily.  Unable to escalate further due to relatively slow heart rates. Will add amlodipine  2.5 mg once daily.  Discussed sedation side effects.  He is agreeable.       Relevant Medications   amLODipine  (NORVASC ) 2.5 MG tablet   Aortic stenosis, severe - Primary   Severe. Now established care with structural heart team and has seen Dr. Abel Hoe who plans to follow-up with him in 6 months and sooner follow-up if he gets symptomatic. With plan for aortic valve replacement by SAVR versus TAVR when indicated.  I reviewed with patient extensively about high risk for morbidity and mortality with aortic stenosis.  If he does report any symptoms of chest pain, shortness of breath, lightheadedness, dizziness to let us  know promptly for expedited evaluation.  He acknowledges and mentions he will reach out to our daughter structural heart team right away.        Relevant  Medications   amLODipine  (NORVASC ) 2.5 MG tablet   Return to clinic in 6 months, earlier follow-up as needed   History of Present Illness:    Steve Kelly is a 68 y.o. male who is being seen today for follow-up visit. PCP is Allen, [place], NP. Last visit with me in the office was 08-03-2023 prior to cardiac cath that was scheduled for aortic stenosis workup   history of severe aortic stenosis, no angiographic evidence of coronary artery disease on cardiac cath 08-10-2023, hypertension, alcohol abuse, former smoker, prediabetes, GERD, CKD stage III, episodes of delirium tremens in the past, here for follow-up after cardiac cath done recent for workup in the setting of aortic stenosis. Has establish care with structural heart team and has seen Dr. Abel Hoe, and given his relative asymptomatic state was recommended to follow-up tentatively in 6 months with options for aortic valve replacement by surgical versus TAVR on the table.  Mentions he has been well.  Denies any chest pain, shortness of breath. He is not very active at home but remains asymptomatic at his day-to-day functional capacity. Post cath no significant symptoms from cath access site.   Continues to take his medications consistently including aspirin  and carvedilol . Blood pressure is uncontrolled today in the office. He denies any lightheadedness, dizziness, syncopal episodes.  Denies any blood in urine or stools.  Past Medical History:  Diagnosis Date   Acute alteration in mental status 03/14/2023   Alcohol abuse    Alcohol withdrawal (HCC) 03/14/2023   Aortic stenosis 04/25/2023  Aortic stenosis, severe    CAP (community acquired pneumonia) 04/25/2023   Chronic alcohol abuse    Delirium tremens (HCC) 04/23/2023   Elevated troponin 04/24/2023   Generalized weakness 03/14/2023   GERD (gastroesophageal reflux disease) 04/23/2023   Heart murmur on physical examination    Heart murmur, systolic 10/09/2022   History of  alcohol use disorder 03/14/2023   History of cerebral infarction    Hyperlipidemia    Hypertension    Hypokalemia 03/14/2023   Leukocytosis 04/23/2023   Metabolic encephalopathy 04/23/2023   Myocardial injury 04/23/2023   Overweight with body mass index (BMI) of 25 to 25.9 in adult    Slurred speech 04/28/2023   Stage 3 chronic kidney disease, unspecified whether stage 3a or 3b CKD (HCC)    Thrombocytopenia (HCC) 03/14/2023   Tremor    Vitamin D  deficiency     Past Surgical History:  Procedure Laterality Date   RIGHT HEART CATH AND CORONARY ANGIOGRAPHY N/A 08/10/2023   Procedure: RIGHT HEART CATH AND CORONARY ANGIOGRAPHY;  Surgeon: Odie Benne, MD;  Location: [facility];  Service: Cardiovascular;  Laterality: N/A;    Current Medications: Current Meds  Medication Sig   acetaminophen  (TYLENOL ) 500 MG tablet Take 500 mg by mouth every 6 (six) hours as needed (pain.).   amLODipine  (NORVASC ) 2.5 MG tablet Take 1 tablet (2.5 mg total) by mouth daily.   aspirin  EC 81 MG tablet Take 1 tablet (81 mg total) by mouth daily. Swallow whole.   carvedilol  (COREG ) 6.25 MG tablet Take 1 tablet (6.25 mg total) by mouth 2 (two) times daily with a meal.   famotidine  (PEPCID ) 20 MG tablet Take 1 tablet (20 mg total) by mouth daily.   Vitamin D , Ergocalciferol , (DRISDOL ) 1.25 MG (50000 UNIT) CAPS capsule Take 50,000 Units by mouth every Wednesday.     Allergies:   Patient has no known allergies.   Social History   Socioeconomic History   Marital status: Single    Spouse name: Not on file   Number of children: 1   Years of education: Not on file   Highest education level: Not on file  Occupational History   Occupation: disabled  Tobacco Use   Smoking status: Former    Types: Cigarettes   Smokeless tobacco: Never  Vaping Use   Vaping status: Never Used  Substance and Sexual Activity   Alcohol use: Not Currently   Drug use: No   Sexual activity: Not Currently  Other  Topics Concern   Not on file  Social History Narrative   Not on file   Social Drivers of Health   Financial Resource Strain: Not on file  Food Insecurity: No Food Insecurity (04/26/2023)   Hunger Vital Sign    Worried About Running Out of Food in the Last Year: Never true    Ran Out of Food in the Last Year: Never true  Transportation Needs: No Transportation Needs (04/26/2023)   PRAPARE - Administrator, Civil Service (Medical): No    Lack of Transportation (Non-Medical): No  Physical Activity: Not on file  Stress: Not on file  Social Connections: Not on file     Family History: The patient's family history includes Diabetes in his mother; Heart attack in his brother and sister; Hyperlipidemia in his mother; Hypertension in his father and mother. ROS:   Please see the history of present illness.    All 14 point review of systems negative except as described per history  of present illness.  EKGs/Labs/Other Studies Reviewed:    The following studies were reviewed today:   EKG:       Recent Labs: 04/23/2023: TSH 0.975 04/24/2023: B Natriuretic Peptide 209.0 05/01/2023: ALT 16; Magnesium  1.9 08/03/2023: BUN 9; Creatinine, Ser 0.95; Platelets 223 08/10/2023: Hemoglobin 13.6; Potassium 2.0; Sodium 116  Recent Lipid Panel    Component Value Date/Time   CHOL 187 04/23/2023 1724   TRIG 79 04/23/2023 1724   HDL 52 04/23/2023 1724   CHOLHDL 3.6 04/23/2023 1724   VLDL 16 04/23/2023 1724   LDLCALC 119 (H) 04/23/2023 1724    Physical Exam:    VS:  BP (!) 160/90   Pulse 60   Ht 5\' 9"  (1.753 m)   Wt 160 lb 9.6 oz (72.8 kg)   SpO2 97%   BMI 23.72 kg/m     Wt Readings from Last 3 Encounters:  09/03/23 160 lb 9.6 oz (72.8 kg)  08/13/23 158 lb (71.7 kg)  08/10/23 152 lb (68.9 kg)     GENERAL:  Well nourished, well developed in no acute distress NECK: No JVD; No carotid bruits CARDIAC: RRR, S1 and S2 present, 5/6 harsh ejection systolic murmur heard all over  the precordium best heard right upper sternal border.  CHEST:  Clear to auscultation without rales, wheezing or rhonchi  Extremities: No pitting pedal edema. Pulses bilaterally symmetric with radial 2+ and dorsalis pedis 2+ NEUROLOGIC:  Alert and oriented x 3  Medication Adjustments/Labs and Tests Ordered: Current medicines are reviewed at length with the patient today.  Concerns regarding medicines are outlined above.  No orders of the defined types were placed in this encounter.  Meds ordered this encounter  Medications   amLODipine  (NORVASC ) 2.5 MG tablet    Sig: Take 1 tablet (2.5 mg total) by mouth daily.    Dispense:  90 tablet    Refill:  3    Signed, Shayden Gingrich reddy Elleanor Guyett, MD, MPH, [facility]. 09/03/2023 10:52 AM    [facility]

## 2023-09-03 NOTE — Patient Instructions (Addendum)
 Medication Instructions:  Start Amlodipine  2.5 mg once a day  *If you need a refill on your cardiac medications before your next appointment, please call your pharmacy*   Lab Work: None Ordered If you have labs (blood work) drawn today and your tests are completely normal, you will receive your results only by: MyChart Message (if you have MyChart) OR A paper copy in the mail If you have any lab test that is abnormal or we need to change your treatment, we will call you to review the results.   Testing/Procedures: None Ordered   Follow-Up: At [facility], you and your health needs are our priority.  As part of our continuing mission to provide you with exceptional heart care, we have created designated Provider Care Teams.  These Care Teams include your primary Cardiologist (physician) and Advanced Practice Providers (APPs -  Physician Assistants and Nurse Practitioners) who all work together to provide you with the care you need, when you need it.  We recommend signing up for the patient portal called "MyChart".  Sign up information is provided on this After Visit Summary.  MyChart is used to connect with patients for Virtual Visits (Telemedicine).  Patients are able to view lab/test results, encounter notes, upcoming appointments, etc.  Non-urgent messages can be sent to your provider as well.   To learn more about what you can do with MyChart, go to [email or web address].    Your next appointment:    6 month follow up

## 2023-09-03 NOTE — Assessment & Plan Note (Signed)
 Blood pressures elevated. Target below 130/80 mmHg. Continue current dose carvedilol  6.25 mg twice daily.  Unable to escalate further due to relatively slow heart rates. Will add amlodipine  2.5 mg once daily.  Discussed sedation side effects.  He is agreeable.

## 2023-10-21 ENCOUNTER — Ambulatory Visit (INDEPENDENT_AMBULATORY_CARE_PROVIDER_SITE_OTHER): Admitting: Neurology

## 2023-10-21 ENCOUNTER — Encounter: Payer: Self-pay | Admitting: Neurology

## 2023-10-21 VITALS — BP 149/77 | HR 63 | Ht 69.0 in | Wt 161.0 lb

## 2023-10-21 DIAGNOSIS — E538 Deficiency of other specified B group vitamins: Secondary | ICD-10-CM

## 2023-10-21 DIAGNOSIS — I639 Cerebral infarction, unspecified: Secondary | ICD-10-CM | POA: Diagnosis not present

## 2023-10-21 MED ORDER — VITAMIN B-12 1000 MCG PO TABS: 1000.0000 ug | ORAL_TABLET | Freq: Every day | ORAL | 3 refills | Status: AC

## 2023-10-21 MED ORDER — ROSUVASTATIN CALCIUM 10 MG PO TABS: 10.0000 mg | ORAL_TABLET | Freq: Every day | ORAL | 1 refills | Status: AC

## 2023-10-21 NOTE — Patient Instructions (Signed)
 Continue current medications  Will start patient on Crestor 10 mg to take nightly  Will start Vitamin B 12 supplement daily  Continue to follow up with PCP  Return as needed

## 2023-10-21 NOTE — Progress Notes (Signed)
 [facility]  PATIENT: Steve Kelly DOB: 05-09-1956  REQUESTING CLINICIAN: Allen, [place], NP HISTORY FROM: Patient/Chart review  REASON FOR VISIT: History of stroke    HISTORICAL  CHIEF COMPLAINT:  Chief Complaint  Patient presents with   New Patient (Initial Visit)    Rm12, ALONE, New pt external referral from [facility] NP [phone] for hx cerebral infarction, dementia: MMSE SCORE OF 27. PT STATED THAT SINCE CVA DOING WELL    HISTORY OF PRESENT ILLNESS:  This is a 68 year old gentleman past medical history of hypertension, hyperlipidemia, alcohol abuse who was referred to neurology for history of stroke and cognitive impairment.  He tells me that he was never formally diagnosed with stroke, never had any symptoms associated with stroke.  In December he was admitted to hospital for altered mental status and MRI show chronic cerebellar strokes.  He is on aspirin , blood pressure medication, latest LDL was 119.  He tells me after leaving the hospital he was in rehab for 39-months.  Currently he lives with his brother, he is doing well, still drinks beer daily and has no additional complaint or concerns at the moment. Denies any symptoms such as weakness, numbness, no headaches or changes in vision.   OTHER MEDICAL CONDITIONS: Hypertension, Hyperlipidemia, Alcohol abuse    REVIEW OF SYSTEMS: Full 14 system review of systems performed and negative with exception of: As noted in the HPI   ALLERGIES: No Known Allergies  HOME MEDICATIONS: Outpatient Medications Prior to Visit  Medication Sig Dispense Refill   acetaminophen  (TYLENOL ) 500 MG tablet Take 500 mg by mouth every 6 (six) hours as needed (pain.).     amLODipine  (NORVASC ) 2.5 MG tablet Take 1 tablet (2.5 mg total) by mouth daily. 90 tablet 3   aspirin  EC 81 MG tablet Take 1 tablet (81 mg total) by mouth daily. Swallow whole.     carvedilol  (COREG ) 6.25 MG tablet Take 1 tablet (6.25 mg  total) by mouth 2 (two) times daily with a meal.     famotidine  (PEPCID ) 20 MG tablet Take 1 tablet (20 mg total) by mouth daily.     Vitamin D , Ergocalciferol , (DRISDOL ) 1.25 MG (50000 UNIT) CAPS capsule Take 50,000 Units by mouth every Wednesday.     No facility-administered medications prior to visit.    PAST MEDICAL HISTORY: Past Medical History:  Diagnosis Date   Acute alteration in mental status 03/14/2023   Alcohol abuse    Alcohol withdrawal (HCC) 03/14/2023   Aortic stenosis 04/25/2023   Aortic stenosis, severe    CAP (community acquired pneumonia) 04/25/2023   Chronic alcohol abuse    Delirium tremens (HCC) 04/23/2023   Elevated troponin 04/24/2023   Generalized weakness 03/14/2023   GERD (gastroesophageal reflux disease) 04/23/2023   Heart murmur on physical examination    Heart murmur, systolic 10/09/2022   History of alcohol use disorder 03/14/2023   History of cerebral infarction    Hyperlipidemia    Hypertension    Hypokalemia 03/14/2023   Leukocytosis 04/23/2023   Metabolic encephalopathy 04/23/2023   Myocardial injury 04/23/2023   Overweight with body mass index (BMI) of 25 to 25.9 in adult    Slurred speech 04/28/2023   Stage 3 chronic kidney disease, unspecified whether stage 3a or 3b CKD (HCC)    Thrombocytopenia (HCC) 03/14/2023   Tremor    Vitamin D  deficiency     PAST SURGICAL HISTORY: Past Surgical History:  Procedure Laterality Date   RIGHT HEART CATH AND CORONARY  ANGIOGRAPHY N/A 08/10/2023   Procedure: RIGHT HEART CATH AND CORONARY ANGIOGRAPHY;  Surgeon: Odie Benne, MD;  Location: [facility];  Service: Cardiovascular;  Laterality: N/A;    FAMILY HISTORY: Family History  Problem Relation Age of Onset   Diabetes Mother    Hypertension Mother    Hyperlipidemia Mother    Hypertension Father    Heart attack Sister    Heart attack Brother     SOCIAL HISTORY: Social History   Socioeconomic History   Marital status:  Single    Spouse name: Not on file   Number of children: 1   Years of education: Not on file   Highest education level: Not on file  Occupational History   Occupation: disabled  Tobacco Use   Smoking status: Former    Types: Cigarettes   Smokeless tobacco: Never  Vaping Use   Vaping status: Never Used  Substance and Sexual Activity   Alcohol use: Not Currently   Drug use: No   Sexual activity: Not Currently  Other Topics Concern   Not on file  Social History Narrative   Not on file   Social Drivers of Health   Financial Resource Strain: Not on file  Food Insecurity: No Food Insecurity (04/26/2023)   Hunger Vital Sign    Worried About Running Out of Food in the Last Year: Never true    Ran Out of Food in the Last Year: Never true  Transportation Needs: No Transportation Needs (04/26/2023)   PRAPARE - Administrator, Civil Service (Medical): No    Lack of Transportation (Non-Medical): No  Physical Activity: Not on file  Stress: Not on file  Social Connections: Not on file  Intimate Partner Violence: Not At Risk (04/26/2023)   Humiliation, Afraid, Rape, and Kick questionnaire    Fear of Current or Ex-Partner: No    Emotionally Abused: No    Physically Abused: No    Sexually Abused: No    PHYSICAL EXAM  GENERAL EXAM/CONSTITUTIONAL: Vitals:  Vitals:   10/21/23 0939 10/21/23 0944  BP: (!) 140/82 (!) 149/77  Pulse: 63   SpO2: 95%   Weight: 161 lb (73 kg)   Height: 5' 9 (1.753 m)    Body mass index is 23.78 kg/m. Wt Readings from Last 3 Encounters:  10/21/23 161 lb (73 kg)  09/03/23 160 lb 9.6 oz (72.8 kg)  08/13/23 158 lb (71.7 kg)   Patient is in no distress; well developed, nourished and groomed; neck is supple  MUSCULOSKELETAL: Gait, strength, tone, movements noted in Neurologic exam below  NEUROLOGIC: MENTAL STATUS:     10/21/2023    9:41 AM  MMSE - Mini Mental State Exam  Orientation to time 4  Orientation to Place 4  Registration 3   Attention/ Calculation 5  Recall 2  Language- name 2 objects 2  Language- repeat 1  Language- follow 3 step command 3  Language- read & follow direction 1  Write a sentence 1  Copy design 1  Total score 27   awake, alert, oriented to person, place and time recent and remote memory intact normal attention and concentration language fluent, comprehension intact, naming intact fund of knowledge appropriate  CRANIAL NERVE:  2nd, 3rd, 4th, 6th - Visual fields full to confrontation, extraocular muscles intact, no nystagmus 5th - facial sensation symmetric 7th - facial strength symmetric 8th - hearing intact 9th - palate elevates symmetrically, uvula midline 11th - shoulder shrug symmetric 12th - tongue protrusion  midline  MOTOR:  normal bulk and tone, full strength in the BUE, BLE  SENSORY:  normal and symmetric to light touch  COORDINATION:  finger-nose-finger, fine finger movements normal  GAIT/STATION:  normal   DIAGNOSTIC DATA (LABS, IMAGING, TESTING) - I reviewed patient records, labs, notes, testing and imaging myself where available.  Lab Results  Component Value Date   WBC 5.7 08/03/2023   HGB 13.6 08/10/2023   HCT 40.0 08/10/2023   MCV 92 08/03/2023   PLT 223 08/03/2023      Component Value Date/Time   NA 116 (LL) 08/10/2023 1338   NA 141 08/03/2023 1052   K 2.0 (LL) 08/10/2023 1338   CL 102 08/03/2023 1052   CO2 22 08/03/2023 1052   GLUCOSE 88 08/03/2023 1052   GLUCOSE 95 05/05/2023 0547   BUN 9 08/03/2023 1052   CREATININE 0.95 08/03/2023 1052   CALCIUM 10.3 (H) 08/03/2023 1052   PROT 7.6 05/01/2023 0453   ALBUMIN 3.2 (L) 05/01/2023 0453   AST 23 05/01/2023 0453   ALT 16 05/01/2023 0453   ALKPHOS 52 05/01/2023 0453   BILITOT 0.5 05/01/2023 0453   GFRNONAA >60 05/05/2023 0547   GFRAA 52 (L) 01/16/2017 1937   Lab Results  Component Value Date   CHOL 187 04/23/2023   HDL 52 04/23/2023   LDLCALC 119 (H) 04/23/2023   TRIG 79 04/23/2023    CHOLHDL 3.6 04/23/2023   Lab Results  Component Value Date   HGBA1C 5.3 04/23/2023   Lab Results  Component Value Date   VITAMINB12 193 04/23/2023   Lab Results  Component Value Date   TSH 0.975 04/23/2023    MRI Brain 04/23/2023 1. No acute intracranial abnormality. 2. Moderately advanced cerebral atrophy with chronic microvascular ischemic disease. Few small remote cerebellar infarcts.   ASSESSMENT AND PLAN  68 y.o. year old male with hypertension, hyperlipidemia, alcohol abuse close presenting with history of stroke.  MRI showed advanced cerebral atrophy and chronic microvascular ischemic changes.  There was also a few cerebral infarcts.  This was small in size and likely due to small vessel disease.  Patient is already on aspirin , amlodipine , will add Crestor 10 due to LDL being 119.  His B12 was also noted to be 193, I will add B12 supplement to take daily.  Advised him to continue following up with his PCP and return as needed.  He voiced understanding.  For his cognitive impairment, he has a normal MMSE of 27 and he is independent in all ADLs.    1. Cerebellar stroke (HCC)   2. Vitamin B 12 deficiency      Patient Instructions  Continue current medications  Will start patient on Crestor 10 mg to take nightly  Will start Vitamin B 12 supplement daily  Continue to follow up with PCP  Return as needed   No orders of the defined types were placed in this encounter.   Meds ordered this encounter  Medications   rosuvastatin (CRESTOR) 10 MG tablet    Sig: Take 1 tablet (10 mg total) by mouth daily.    Dispense:  90 tablet    Refill:  1   cyanocobalamin  (VITAMIN B12) 1000 MCG tablet    Sig: Take 1 tablet (1,000 mcg total) by mouth daily.    Dispense:  90 tablet    Refill:  3    Return if symptoms worsen or fail to improve.  I personally spent a total of 50 minutes in the care of the  patient today including preparing to see the patient, getting/reviewing separately  obtained history, performing a medically appropriate exam/evaluation, counseling and educating, placing orders, documenting clinical information in the EHR, independently interpreting results, and communicating results.   Cassandra Cleveland, MD 10/21/2023, 10:33 AM  [facility] [address], [address] [place], [place] [address] [phone]

## 2024-02-15 ENCOUNTER — Other Ambulatory Visit

## 2024-02-16 ENCOUNTER — Ambulatory Visit (HOSPITAL_COMMUNITY)
Admission: RE | Admit: 2024-02-16 | Discharge: 2024-02-16 | Disposition: A | Discharge status: Home / Self Care | Source: Ambulatory Visit | Attending: Cardiology | Admitting: Cardiology

## 2024-02-16 DIAGNOSIS — I350 Nonrheumatic aortic (valve) stenosis: Secondary | ICD-10-CM | POA: Insufficient documentation

## 2024-02-16 LAB — ECHOCARDIOGRAM COMPLETE
AR max vel: 0.62 cm2
AV Area VTI: 0.69 cm2
AV Area mean vel: 0.63 cm2
AV Mean grad: 42 mmHg
AV Peak grad: 67.2 mmHg
Ao pk vel: 4.1 m/s
Area-P 1/2: 4.06 cm2
P 1/2 time: 396 ms
S' Lateral: 2.7 cm

## 2024-02-17 ENCOUNTER — Ambulatory Visit: Payer: Self-pay | Admitting: Cardiovascular Disease

## 2024-02-22 ENCOUNTER — Ambulatory Visit

## 2024-02-22 VITALS — BP 124/80 | HR 62 | Ht 69.0 in | Wt 162.2 lb

## 2024-02-22 DIAGNOSIS — E782 Mixed hyperlipidemia: Secondary | ICD-10-CM | POA: Insufficient documentation

## 2024-02-22 DIAGNOSIS — I10 Essential (primary) hypertension: Secondary | ICD-10-CM | POA: Insufficient documentation

## 2024-02-22 DIAGNOSIS — F1010 Alcohol abuse, uncomplicated: Secondary | ICD-10-CM | POA: Insufficient documentation

## 2024-02-22 DIAGNOSIS — I350 Nonrheumatic aortic (valve) stenosis: Secondary | ICD-10-CM | POA: Diagnosis present

## 2024-02-22 NOTE — Assessment & Plan Note (Signed)
 Well-controlled. Continue low-dose carvedilol  and amlodipine .

## 2024-02-22 NOTE — Progress Notes (Signed)
 Cardiology Consultation:    Date:  02/22/2024   ID:  Steve Kelly, DOB 30-Apr-1956, MRN [id number]  PCP:  Allen, [place], NP  Cardiologist:  Alean SAUNDERS Arvel Oquinn, MD   Referring MD: Allen, [place], NP   No chief complaint on file.    ASSESSMENT AND PLAN:   [name] 68 year old male history of severe aortic stenosis, no angiographic evidence of coronary artery disease on cardiac cath 08-10-2023, hypertension, alcohol abuse, former smoker, prediabetes, GERD, CKD stage III, episodes of delirium tremens in the past, here for follow-up after cardiac cath done recent for workup in the setting of aortic stenosis. Has established care with structural heart team and has seen Dr. Verlin, and given his relative asymptomatic state was recommended to follow-up tentatively in 6 months   Problem List Items Addressed This Visit     Hypertension   Well-controlled. Continue low-dose carvedilol  and amlodipine .       Aortic stenosis, severe - Primary   Severe aortic stenosis further progress of valve severity on echocardiogram 02/16/2024.  Associated with moderate aortic insufficiency.  Had established care with structural heart team previously. Will request to expedite follow-up visit with them to further review progressive echocardiogram findings and consider addition for TAVR.       Chronic alcohol abuse   Reviewed harmful effects of alcohol. Advised symptom cut down and gradually wean himself off alcohol. If he is unable to abstain strongly recommended no more than 1 or 2 drinks in a day and no more than 3-4 times a week.      Hyperlipidemia   Continue rosuvastatin  10 mg once daily. Last lipid panel 10/27/2023 total cholesterol 133, HDL 39, LDL 79, triglycerides 74. Target less than 100 LDL.      Return to clinic tentatively based on follow-up visit with structural heart team.  Otherwise we will see him in about 6 months. History of Present Illness:    Steve Kelly is a 68 y.o.  male who is being seen today for t follow-up visit. PCP is Allen, [place], NP. Last visit with me in the office was 09/03/2023.  history of severe aortic stenosis, no angiographic evidence of coronary artery disease on cardiac cath 08-10-2023, hypertension, alcohol abuse, former smoker, prediabetes, GERD, CKD stage III, episodes of delirium tremens in the past, here for follow-up after cardiac cath done recent for workup in the setting of aortic stenosis.  Has establish care with structural heart team and has seen Dr. Verlin, and given his relative asymptomatic state was recommended to follow-up tentatively in 6 months   Had a follow-up echocardiogram completed last week 02/16/2024 with LVEF 60 to 65%, mild LVH, grade 1 diastolic dysfunction, aortic stenosis severe associated with moderate insufficiency.  Aortic valve area calculated 0.69 cm, mean gradient 42 mmHg and V-max 4.1 m/s.  Lives with his brother at home. Mentions has been doing well. Denies any chest pain, shortness of breath, orthopnea.  No palpitation, lightheadedness dizziness or syncopal episodes. When further pressed in question he mentions he might have felt lightheaded a month ago but did not fall down or lose consciousness.  Mentions overall his summer has been good and he has gone to [place]  couple occasions and did well.  Good compliance with his medications.  Mentions he continues to drink alcohol most days of the week up to a sixpack of beer.  Past Medical History:  Diagnosis Date   Acute alteration in mental status 03/14/2023   Alcohol abuse  Alcohol withdrawal (HCC) 03/14/2023   Aortic stenosis 04/25/2023   Aortic stenosis, severe    CAP (community acquired pneumonia) 04/25/2023   Chronic alcohol abuse    Delirium tremens (HCC) 04/23/2023   Elevated troponin 04/24/2023   Generalized weakness 03/14/2023   GERD (gastroesophageal reflux disease) 04/23/2023   Heart murmur on physical examination    Heart  murmur, systolic 10/09/2022   History of alcohol use disorder 03/14/2023   History of cerebral infarction    Hyperlipidemia    Hypertension    Hypokalemia 03/14/2023   Leukocytosis 04/23/2023   Metabolic encephalopathy 04/23/2023   Myocardial injury 04/23/2023   Overweight with body mass index (BMI) of 25 to 25.9 in adult    Slurred speech 04/28/2023   Stage 3 chronic kidney disease, unspecified whether stage 3a or 3b CKD (HCC)    Thrombocytopenia 03/14/2023   Tremor    Vitamin D  deficiency     Past Surgical History:  Procedure Laterality Date   RIGHT HEART CATH AND CORONARY ANGIOGRAPHY N/A 08/10/2023   Procedure: RIGHT HEART CATH AND CORONARY ANGIOGRAPHY;  Surgeon: Verlin Lonni BIRCH, MD;  Location: [facility];  Service: Cardiovascular;  Laterality: N/A;    Current Medications: Current Meds  Medication Sig   acetaminophen  (TYLENOL ) 500 MG tablet Take 500 mg by mouth every 6 (six) hours as needed (pain.).   amLODipine  (NORVASC ) 2.5 MG tablet Take 1 tablet (2.5 mg total) by mouth daily.   aspirin  EC 81 MG tablet Take 1 tablet (81 mg total) by mouth daily. Swallow whole.   carvedilol  (COREG ) 6.25 MG tablet Take 1 tablet (6.25 mg total) by mouth 2 (two) times daily with a meal.   cyanocobalamin  (VITAMIN B12) 1000 MCG tablet Take 1 tablet (1,000 mcg total) by mouth daily.   famotidine  (PEPCID ) 20 MG tablet Take 1 tablet (20 mg total) by mouth daily.   rosuvastatin  (CRESTOR ) 10 MG tablet Take 1 tablet (10 mg total) by mouth daily.   tiZANidine (ZANAFLEX) 2 MG tablet Take 2 mg by mouth 3 (three) times daily as needed.   Vitamin D , Ergocalciferol , (DRISDOL ) 1.25 MG (50000 UNIT) CAPS capsule Take 50,000 Units by mouth every Wednesday.     Allergies:   Patient has no known allergies.   Social History   Socioeconomic History   Marital status: Single    Spouse name: Not on file   Number of children: 1   Years of education: Not on file   Highest education level: Not on  file  Occupational History   Occupation: disabled  Tobacco Use   Smoking status: Former    Types: Cigarettes   Smokeless tobacco: Never  Vaping Use   Vaping status: Never Used  Substance and Sexual Activity   Alcohol use: Not Currently   Drug use: No   Sexual activity: Not Currently  Other Topics Concern   Not on file  Social History Narrative   Not on file   Social Drivers of Health   Financial Resource Strain: Not on file  Food Insecurity: No Food Insecurity (04/26/2023)   Hunger Vital Sign    Worried About Running Out of Food in the Last Year: Never true    Ran Out of Food in the Last Year: Never true  Transportation Needs: No Transportation Needs (04/26/2023)   PRAPARE - Administrator, Civil Service (Medical): No    Lack of Transportation (Non-Medical): No  Physical Activity: Not on file  Stress: Not on file  Social  Connections: Not on file     Family History: The patient's family history includes Diabetes in his mother; Heart attack in his brother and sister; Hyperlipidemia in his mother; Hypertension in his father and mother. ROS:   Please see the history of present illness.    All 14 point review of systems negative except as described per history of present illness.  EKGs/Labs/Other Studies Reviewed:    The following studies were reviewed today:   EKG:       Recent Labs: 04/23/2023: TSH 0.975 04/24/2023: B Natriuretic Peptide 209.0 05/01/2023: ALT 16; Magnesium  1.9 08/03/2023: BUN 9; Creatinine, Ser 0.95; Platelets 223 08/10/2023: Hemoglobin 13.6; Potassium 2.0; Sodium 116  Recent Lipid Panel    Component Value Date/Time   CHOL 187 04/23/2023 1724   TRIG 79 04/23/2023 1724   HDL 52 04/23/2023 1724   CHOLHDL 3.6 04/23/2023 1724   VLDL 16 04/23/2023 1724   LDLCALC 119 (H) 04/23/2023 1724    Physical Exam:    VS:  BP 124/80   Pulse 62   Ht 5' 9 (1.753 m)   Wt 162 lb 3.2 oz (73.6 kg)   SpO2 98%   BMI 23.95 kg/m     Wt Readings  from Last 3 Encounters:  02/22/24 162 lb 3.2 oz (73.6 kg)  10/21/23 161 lb (73 kg)  09/03/23 160 lb 9.6 oz (72.8 kg)     GENERAL:  Well nourished, well developed in no acute distress NECK: No JVD; No carotid bruits CARDIAC: RRR, S1 and S2 present, loud harsh 5/6 ejection systolic murmur heard all over the precordium and radiating to the carotids.  CHEST:  Clear to auscultation without rales, wheezing or rhonchi  Extremities: No pitting pedal edema. Pulses bilaterally symmetric with radial 2+ and dorsalis pedis 2+ NEUROLOGIC:  Alert and oriented x 3  Medication Adjustments/Labs and Tests Ordered: Current medicines are reviewed at length with the patient today.  Concerns regarding medicines are outlined above.  No orders of the defined types were placed in this encounter.  No orders of the defined types were placed in this encounter.   Signed, Alean jess Kobus, MD, MPH, [facility]. 02/22/2024 11:18 AM    [facility]

## 2024-02-22 NOTE — Assessment & Plan Note (Signed)
 Reviewed harmful effects of alcohol. Advised symptom cut down and gradually wean himself off alcohol. If he is unable to abstain strongly recommended no more than 1 or 2 drinks in a day and no more than 3-4 times a week.

## 2024-02-22 NOTE — Patient Instructions (Signed)

## 2024-02-22 NOTE — Assessment & Plan Note (Addendum)
 Continue rosuvastatin  10 mg once daily. Last lipid panel 10/27/2023 total cholesterol 133, HDL 39, LDL 79, triglycerides 74. Target less than 100 LDL.

## 2024-02-22 NOTE — Assessment & Plan Note (Signed)
 Severe aortic stenosis further progress of valve severity on echocardiogram 02/16/2024.  Associated with moderate aortic insufficiency.  Had established care with structural heart team previously. Will request to expedite follow-up visit with them to further review progressive echocardiogram findings and consider addition for TAVR.

## 2024-03-16 ENCOUNTER — Ambulatory Visit: Attending: Cardiovascular Disease | Admitting: Cardiovascular Disease

## 2024-03-16 ENCOUNTER — Encounter: Payer: Self-pay | Admitting: Cardiovascular Disease

## 2024-03-16 VITALS — BP 171/83 | HR 63 | Resp 16 | Ht 69.0 in | Wt 162.0 lb

## 2024-03-16 DIAGNOSIS — I350 Nonrheumatic aortic (valve) stenosis: Secondary | ICD-10-CM | POA: Insufficient documentation

## 2024-03-16 NOTE — Progress Notes (Signed)
 [facility] Note  Chief Complaint  Patient presents with   Aortic stenosis, severe   History of Present Illness: 68 yo male with history of etoh abuse, former tobacco abuse, delirium tremens, GERD, HLD, HTN, CKD stage 3 and severe aortic stenosis who is here today for follow up. Echo December 2024 with LVEF=55-60%. Severe aortic stenosis with mean gradient 40 mmHg, AVA 0.63 cm2, SVI 32, DI 0.27. Cardiac cath April 2025 with no evidence of CAD. Normal right heart pressures. When I saw him in the office in April 2025 he was asymptomatic. Echo 02/16/24 with LVEF=60-65%. Normal RV function. Severe aortic stenosis with mean gradient 42 mmHg, AVA 0.62 cm2, DI 0.22, SVI 38. Moderate AI.   He is here today for follow up. The patient denies any chest pain, dyspnea, palpitations, lower extremity edema, orthopnea, PND, dizziness, near syncope or syncope. He feels great. He walks to [facility] which is nearly a half mile from his house and he has no dyspnea with this.   He lives in [place], [place] with his brother. He has not driven in years. He is functional around his house but he is not very active. He is disabled. He has no teeth.   Primary Care Physician: Allen, Chad, NP Primary Cardiologist: Madireddy  Past Medical History:  Diagnosis Date   Acute alteration in mental status 03/14/2023   Alcohol abuse    Alcohol withdrawal (HCC) 03/14/2023   Aortic stenosis 04/25/2023   Aortic stenosis, severe    CAP (community acquired pneumonia) 04/25/2023   Chronic alcohol abuse    Delirium tremens (HCC) 04/23/2023   Elevated troponin 04/24/2023   Generalized weakness 03/14/2023   GERD (gastroesophageal reflux disease) 04/23/2023   Heart murmur on physical examination    Heart murmur, systolic 10/09/2022   History of alcohol use disorder 03/14/2023   History of cerebral infarction    Hyperlipidemia    Hypertension    Hypokalemia 03/14/2023   Leukocytosis 04/23/2023   Metabolic encephalopathy  04/23/2023   Myocardial injury 04/23/2023   Overweight with body mass index (BMI) of 25 to 25.9 in adult    Slurred speech 04/28/2023   Stage 3 chronic kidney disease, unspecified whether stage 3a or 3b CKD (HCC)    Thrombocytopenia 03/14/2023   Tremor    Vitamin D  deficiency     Past Surgical History:  Procedure Laterality Date   RIGHT HEART CATH AND CORONARY ANGIOGRAPHY N/A 08/10/2023   Procedure: RIGHT HEART CATH AND CORONARY ANGIOGRAPHY;  Surgeon: Verlin Lonni BIRCH, MD;  Location: [facility];  Service: Cardiovascular;  Laterality: N/A;    Current Outpatient Medications  Medication Sig Dispense Refill   acetaminophen  (TYLENOL ) 500 MG tablet Take 500 mg by mouth every 6 (six) hours as needed (pain.).     amLODipine  (NORVASC ) 2.5 MG tablet Take 1 tablet (2.5 mg total) by mouth daily. 90 tablet 3   aspirin  EC 81 MG tablet Take 1 tablet (81 mg total) by mouth daily. Swallow whole.     carvedilol  (COREG ) 6.25 MG tablet Take 1 tablet (6.25 mg total) by mouth 2 (two) times daily with a meal.     cyanocobalamin  (VITAMIN B12) 1000 MCG tablet Take 1 tablet (1,000 mcg total) by mouth daily. 90 tablet 3   famotidine  (PEPCID ) 20 MG tablet Take 1 tablet (20 mg total) by mouth daily.     rosuvastatin  (CRESTOR ) 10 MG tablet Take 1 tablet (10 mg total) by mouth daily. 90 tablet 1   tiZANidine (  ZANAFLEX) 2 MG tablet Take 2 mg by mouth 3 (three) times daily as needed.     Vitamin D , Ergocalciferol , (DRISDOL ) 1.25 MG (50000 UNIT) CAPS capsule Take 50,000 Units by mouth every Wednesday.     No current facility-administered medications for this visit.    No Known Allergies  Social History   Socioeconomic History   Marital status: Single    Spouse name: Not on file   Number of children: 1   Years of education: Not on file   Highest education level: Not on file  Occupational History   Occupation: disabled  Tobacco Use   Smoking status: Former    Types: Cigarettes   Smokeless  tobacco: Never  Vaping Use   Vaping status: Never Used  Substance and Sexual Activity   Alcohol use: Not Currently   Drug use: No   Sexual activity: Not Currently  Other Topics Concern   Not on file  Social History Narrative   Not on file   Social Drivers of Health   Financial Resource Strain: Not on file  Food Insecurity: No Food Insecurity (04/26/2023)   Hunger Vital Sign    Worried About Running Out of Food in the Last Year: Never true    Ran Out of Food in the Last Year: Never true  Transportation Needs: No Transportation Needs (04/26/2023)   PRAPARE - Administrator, Civil Service (Medical): No    Lack of Transportation (Non-Medical): No  Physical Activity: Not on file  Stress: Not on file  Social Connections: Not on file  Intimate Partner Violence: Not At Risk (04/26/2023)   Humiliation, Afraid, Rape, and Kick questionnaire    Fear of Current or Ex-Partner: No    Emotionally Abused: No    Physically Abused: No    Sexually Abused: No    Family History  Problem Relation Age of Onset   Diabetes Mother    Hypertension Mother    Hyperlipidemia Mother    Hypertension Father    Heart attack Sister    Heart attack Brother     Review of Systems:  As stated in the HPI and otherwise negative.   BP (!) 171/83 (BP Location: Left Arm, Patient Position: Sitting, Cuff Size: Small)   Pulse 63   Resp 16   Ht 5' 9 (1.753 m)   Wt 162 lb (73.5 kg)   SpO2 97%   BMI 23.92 kg/m   Physical Examination:  General: Well developed, well nourished, NAD  HEENT: OP clear, mucus membranes moist  SKIN: warm, dry. No rashes. Neuro: No focal deficits  Musculoskeletal: Muscle strength 5/5 all ext  Psychiatric: Mood and affect normal  Neck: No JVD, no carotid bruits, no thyromegaly, no lymphadenopathy.  Lungs:Clear bilaterally, no wheezes, rhonci, crackles Cardiovascular: Regular rate and rhythm. Harsh systolic murmur.  Abdomen:Soft. Bowel sounds present. Non-tender.   Extremities: No lower extremity edema. Pulses are 2 + in the bilateral DP/PT.  EKG:  EKG is not ordered today. The ekg ordered today demonstrates   Echo 02/16/24:  1. Left ventricular ejection fraction, by estimation, is 60 to 65%. The  left ventricle has normal function. The left ventricle has no regional  wall motion abnormalities. There is mild concentric left ventricular  hypertrophy. Left ventricular diastolic  parameters are consistent with Grade I diastolic dysfunction (impaired  relaxation). The average left ventricular global longitudinal strain is  -16.6 %. The global longitudinal strain is abnormal.   2. Right ventricular systolic function is normal. The  right ventricular  size is normal. There is normal pulmonary artery systolic pressure.   3. The mitral valve is normal in structure. No evidence of mitral valve  regurgitation. No evidence of mitral stenosis.   4. Progression of severe valvular stenosis since previous echo. The  aortic valve is calcified. There is moderate calcification of the aortic  valve. There is moderate thickening of the aortic valve. Aortic valve  regurgitation is moderate. Severe aortic  valve stenosis. Aortic valve area, by VTI measures 0.69 cm. Aortic valve  mean gradient measures 42.0 mmHg. Aortic valve Vmax measures 4.10 m/s.   5. The inferior vena cava is normal in size with greater than 50%  respiratory variability, suggesting right atrial pressure of 3 mmHg.   FINDINGS   Left Ventricle: Left ventricular ejection fraction, by estimation, is 60  to 65%. The left ventricle has normal function. The left ventricle has no  regional wall motion abnormalities. The average left ventricular global  longitudinal strain is -16.6 %.  Strain was performed and the global longitudinal strain is abnormal. The  left ventricular internal cavity size was normal in size. There is mild  concentric left ventricular hypertrophy. Left ventricular diastolic   parameters are consistent with Grade I  diastolic dysfunction (impaired relaxation).   Right Ventricle: The right ventricular size is normal. No increase in  right ventricular wall thickness. Right ventricular systolic function is  normal. There is normal pulmonary artery systolic pressure. The tricuspid  regurgitant velocity is 2.45 m/s, and   with an assumed right atrial pressure of 3 mmHg, the estimated right  ventricular systolic pressure is 27.0 mmHg.   Left Atrium: Left atrial size was normal in size.   Right Atrium: Right atrial size was normal in size.   Pericardium: There is no evidence of pericardial effusion.   Mitral Valve: The mitral valve is normal in structure. No evidence of  mitral valve regurgitation. No evidence of mitral valve stenosis.   Tricuspid Valve: The tricuspid valve is normal in structure. Tricuspid  valve regurgitation is mild . No evidence of tricuspid stenosis.   Aortic Valve: Progression of severe valvular stenosis since previous echo.  The aortic valve is calcified. There is moderate calcification of the  aortic valve. There is moderate thickening of the aortic valve. Aortic  valve regurgitation is moderate.  Aortic regurgitation PHT measures 396 msec. Severe aortic stenosis is  present. Aortic valve mean gradient measures 42.0 mmHg. Aortic valve peak  gradient measures 67.2 mmHg. Aortic valve area, by VTI measures 0.69 cm.   Pulmonic Valve: The pulmonic valve was normal in structure. Pulmonic valve  regurgitation is mild. No evidence of pulmonic stenosis.   Aorta: The aortic root is normal in size and structure.   Venous: The inferior vena cava is normal in size with greater than 50%  respiratory variability, suggesting right atrial pressure of 3 mmHg.   IAS/Shunts: No atrial level shunt detected by color flow Doppler.     LEFT VENTRICLE  PLAX 2D  LVIDd:         4.00 cm   Diastology  LVIDs:         2.70 cm   LV e' medial:    6.96 cm/s   LV PW:         1.30 cm   LV E/e' medial:  13.2  LV IVS:        1.30 cm   LV e' lateral:   6.96 cm/s  LVOT diam:  2.00 cm   LV E/e' lateral: 13.2  LV SV:         72  LV SV Index:   38        2D Longitudinal Strain  LVOT Area:     3.14 cm  2D Strain GLS (A4C):   -16.5 %  LV IVRT:       120 msec  2D Strain GLS (A3C):   -14.5 %                           2D Strain GLS (A2C):   -18.8 %                           2D Strain GLS Avg:     -16.6 %   RIGHT VENTRICLE  RV Basal diam:  3.70 cm     PULMONARY VEINS  RV S prime:     15.70 cm/s  A Reversal Velocity: 31.30 cm/s  TAPSE (M-mode): 2.3 cm      Diastolic Velocity:  43.10 cm/s  RVSP:           27.0 mmHg   S/D Velocity:        1.40                              Systolic Velocity:   61.10 cm/s   LEFT ATRIUM             Index        RIGHT ATRIUM           Index  LA diam:        4.10 cm 2.18 cm/m   RA Pressure: 3.00 mmHg  LA Vol (A2C):   69.3 ml 36.78 ml/m  RA Area:     15.40 cm  LA Vol (A4C):   37.2 ml 19.74 ml/m  RA Volume:   39.60 ml  21.02 ml/m  LA Biplane Vol: 52.0 ml 27.60 ml/m   AORTIC VALVE  AV Area (Vmax):    0.62 cm  AV Area (Vmean):   0.63 cm  AV Area (VTI):     0.69 cm  AV Vmax:           410.00 cm/s  AV Vmean:          303.000 cm/s  AV VTI:            1.050 m  AV Peak Grad:      67.2 mmHg  AV Mean Grad:      42.0 mmHg  LVOT Vmax:         80.60 cm/s  LVOT Vmean:        60.600 cm/s  LVOT VTI:          0.229 m  LVOT/AV VTI ratio: 0.22  AI PHT:            396 msec    AORTA  Ao Root diam: 3.20 cm  Ao Asc diam:  3.50 cm   MITRAL VALVE               TRICUSPID VALVE  MV Area (PHT):             TR Peak grad:   24.0 mmHg  MV Decel Time:             TR Vmax:  245.00 cm/s  MV E velocity: 92.20 cm/s  Estimated RAP:  3.00 mmHg  MV A velocity: 88.00 cm/s  RVSP:           27.0 mmHg  MV E/A ratio:  1.05                             SHUNTS                             Systemic VTI:  0.23 m                              Systemic Diam: 2.00 cm   Cardiac cath 08/10/23: No angiographic evidence of CAD Normal right heart pressures   Coronary Diagrams  Diagnostic Dominance: Right  Intervention   Implants   No implant documentation for this case.   Syngo Images   Show images for CARDIAC CATHETERIZATION Images on Long Term Storage   Show images for [place], Dolinger to Procedure Log  Procedure Log    Hemo Data  Flowsheet Row Most Recent Value  Fick Cardiac Output 3.56 L/min  Fick Cardiac Output Index 1.94 (L/min)/BSA  RA A Wave 11 mmHg  RA V Wave 10 mmHg  RA Mean 10 mmHg  RV Systolic Pressure 35 mmHg  RV Diastolic Pressure 10 mmHg  RV EDP 12 mmHg  PA Systolic Pressure 35 mmHg  PA Diastolic Pressure 18 mmHg  PA Mean 24 mmHg  PW A Wave 17 mmHg  PW V Wave 15 mmHg  PW Mean 16 mmHg  AO Systolic Pressure 159 mmHg  AO Diastolic Pressure 79 mmHg  AO Mean 111 mmHg  QP/QS 1  TPVR Index 12.39 HRUI  TSVR Index 49.57 HRUI  PVR SVR Ratio 0.09  TPVR/TSVR Ratio 0.25    Recent Labs: 04/23/2023: TSH 0.975 04/24/2023: B Natriuretic Peptide 209.0 05/01/2023: ALT 16; Magnesium  1.9 08/03/2023: BUN 9; Creatinine, Ser 0.95; Platelets 223 08/10/2023: Hemoglobin 13.6; Potassium 2.0; Sodium 116    Wt Readings from Last 3 Encounters:  03/16/24 162 lb (73.5 kg)  02/22/24 162 lb 3.2 oz (73.6 kg)  10/21/23 161 lb (73 kg)    Assessment and Plan:   1. Severe Aortic Valve Stenosis: He has severe aortic stenosis with moderate AI. NYHA class 1. I have personally reviewed the echo images. The aortic valve is thickened and calcified with limited leaflet mobility and there is moderate AI. I think he would benefit from AVR. He would be a candidate for surgical AVR or TAVR but given his overall functional state, he may do better with TAVR.  He is not excited about having TAVR right now since he feels great but I think we need to go ahead and plan for TAVR given the severity of his valve disease (mixed AS/AI). He  would like to wait until after Christmas to have the TAVR done but he is willing to go ahead and have his scans.   I have reviewed the natural history of aortic stenosis with the patient and their family members  who are present today. We have discussed the limitations of medical therapy and the poor prognosis associated with symptomatic aortic stenosis. We have reviewed potential treatment options, including palliative medical therapy, conventional surgical aortic valve replacement, and transcatheter aortic valve replacement. We discussed treatment options in the context of the patient's  specific comorbid medical conditions.   He would like to proceed with planning for TAVR. Risks and benefits of the valve procedure are reviewed with the patient. We will arrange a cardiac CT, CTA of the chest/abdomen and pelvis and he will then be referred to see one of the CT surgeons on our TAVR team. He is asking that we not consider TAVR until after the holidays.     Labs/ tests ordered today include:  No orders of the defined types were placed in this encounter.  Disposition:   F/U will be arranged with the structural team  Signed, Lonni Cash, MD, [facility] 03/16/2024 12:20 PM    [facility] [address] [place], [place], [place]  [address] Phone: [phone]; Fax: [phone]

## 2024-03-16 NOTE — Patient Instructions (Signed)
 Medication Instructions:  Your physician recommends that you continue on your current medications as directed. Please refer to the Current Medication list given to you today.  *If you need a refill on your cardiac medications before your next appointment, please call your pharmacy*  Lab Work: none If you have labs (blood work) drawn today and your tests are completely normal, you will receive your results only by: MyChart Message (if you have MyChart) OR A paper copy in the mail If you have any lab test that is abnormal or we need to change your treatment, we will call you to review the results.  Testing/Procedures: none  Follow-Up: At [facility], you and your health needs are our priority.  As part of our continuing mission to provide you with exceptional heart care, our providers are all part of one team.  This team includes your primary Cardiologist (physician) and Advanced Practice Providers or APPs (Physician Assistants and Nurse Practitioners) who all work together to provide you with the care you need, when you need it.  Your next appointment:   To be determined.  Lauren will contact you to schedule CT Scans  Provider:   Dr Verlin   We recommend signing up for the patient portal called MyChart.  Sign up information is provided on this After Visit Summary.  MyChart is used to connect with patients for Virtual Visits (Telemedicine).  Patients are able to view lab/test results, encounter notes, upcoming appointments, etc.  Non-urgent messages can be sent to your provider as well.   To learn more about what you can do with MyChart, go to [email or web address].   Other Instructions

## 2024-03-16 NOTE — Progress Notes (Signed)
 Pre Surgical Assessment: 5 M Walk Test  28M=16.32ft  5 Meter Walk Test- trial 1: 4.89 seconds 5 Meter Walk Test- trial 2: 4.79 seconds 5 Meter Walk Test- trial 3: 4.31 seconds 5 Meter Walk Test Average: 4.66 seconds

## 2024-03-17 ENCOUNTER — Other Ambulatory Visit: Payer: Self-pay

## 2024-03-17 DIAGNOSIS — I350 Nonrheumatic aortic (valve) stenosis: Secondary | ICD-10-CM

## 2024-03-23 LAB — BASIC METABOLIC PANEL WITH GFR
BUN/Creatinine Ratio: 10 (ref 10–24)
BUN: 10 mg/dL (ref 8–27)
CO2: 24 mmol/L (ref 20–29)
Calcium: 10.2 mg/dL (ref 8.6–10.2)
Chloride: 102 mmol/L (ref 96–106)
Creatinine, Ser: 1 mg/dL (ref 0.76–1.27)
Glucose: 85 mg/dL (ref 70–99)
Potassium: 4.2 mmol/L (ref 3.5–5.2)
Sodium: 139 mmol/L (ref 134–144)
eGFR: 83 mL/min/1.73 (ref >59)

## 2024-03-24 ENCOUNTER — Ambulatory Visit: Payer: Self-pay | Admitting: Cardiovascular Disease

## 2024-03-29 ENCOUNTER — Ambulatory Visit (HOSPITAL_COMMUNITY)
Admission: RE | Admit: 2024-03-29 | Discharge: 2024-03-29 | Disposition: A | Discharge status: Home / Self Care | Source: Ambulatory Visit | Attending: Cardiology | Admitting: Cardiology

## 2024-03-29 DIAGNOSIS — I350 Nonrheumatic aortic (valve) stenosis: Secondary | ICD-10-CM | POA: Insufficient documentation

## 2024-03-29 MED ORDER — IOHEXOL 350 MG/ML SOLN
100.0000 mL | Freq: Once | INTRAVENOUS | Status: AC | PRN
  Administered 2024-03-29: 100 mL via INTRAVENOUS

## 2024-03-29 NOTE — Progress Notes (Signed)
 Procedure Type: Isolated AVR Perioperative Outcome Estimate % Operative Mortality 0.658% Morbidity & Mortality 4.94% Stroke 0.949% Renal Failure 0.661% Reoperation 3.9% Prolonged Ventilation 2.06% Deep Sternal Wound Infection 0.042% Long Hospital Stay (>14 days) 1.58% Short Hospital Stay (<6 days)* 64.9%

## 2024-04-03 ENCOUNTER — Ambulatory Visit: Payer: Self-pay | Admitting: Cardiovascular Disease

## 2024-04-03 ENCOUNTER — Encounter: Payer: Self-pay | Admitting: Physician Assistant

## 2024-05-01 ENCOUNTER — Encounter: Payer: Self-pay | Admitting: Surgery

## 2024-05-01 ENCOUNTER — Ambulatory Visit: Attending: Surgery | Admitting: Surgery

## 2024-05-01 VITALS — BP 165/78 | HR 72 | Resp 20 | Ht 69.0 in | Wt 162.0 lb

## 2024-05-01 DIAGNOSIS — I350 Nonrheumatic aortic (valve) stenosis: Secondary | ICD-10-CM | POA: Diagnosis not present

## 2024-05-01 NOTE — Progress Notes (Signed)
 Patient ID: Steve Kelly, male   DOB: 1955/10/19, 68 y.o.   MRN: [id number]   [facility]   MULTIDISCIPLINARY HEART VALVE CLINIC         CARDIOTHORACIC SURGERY CONSULTATION REPORT  PCP is Allen, Chad, NP Referring Provider is Lonni Cash, MD Primary Cardiologist is Alean SAUNDERS Madireddy, MD  Reason for consultation: Severe aortic stenosis  HPI:  The patient is a 68 year old gentleman with history of hypertension, hyperlipidemia, EtOH abuse in the past with admission in 04/2023 for withdrawal, former smoker, and severe aortic stenosis who was referred for consideration of TAVR.  A 2D echocardiogram in 04/2023 showed a mean aortic valve gradient of 40 mmHg with a valve area of 0.74 cm.  This was felt to be consistent with severe aortic stenosis.  Left ventricular ejection fraction was 55 to 60% with grade 1 diastolic dysfunction.  He subsequently underwent catheterization in April 2025 showing no evidence of coronary disease and normal right heart pressures.  Repeat echocardiogram on 02/16/2024 showed a severely calcified and restricted aortic valve with a mean gradient of 42 mmHg and a valve area by VTI of 0.69 cm.  Left ventricular ejection fraction was 60 to 65% with mild concentric LVH and grade 1 diastolic dysfunction.  There was moderate aortic insufficiency.  The patient is disabled and lives with his brother in [place].  He does things around the house but is otherwise not very active.  He denies any chest pressure or pain.  He denies shortness of breath.  He has had no dizziness or syncope.  He denies orthopnea and peripheral edema.  Past Medical History:  Diagnosis Date   Alcohol abuse    Alcohol withdrawal (HCC) 03/14/2023   Aortic stenosis, severe    CAP (community acquired pneumonia) 04/25/2023   Chronic alcohol abuse    Delirium tremens (HCC) 04/23/2023   Generalized weakness 03/14/2023   GERD (gastroesophageal reflux disease) 04/23/2023   History  of alcohol use disorder 03/14/2023   History of cerebral infarction    Hyperlipidemia    Hypertension    Hypokalemia 03/14/2023   Leukocytosis 04/23/2023   Metabolic encephalopathy 04/23/2023   Myocardial injury 04/23/2023   Overweight with body mass index (BMI) of 25 to 25.9 in adult    Slurred speech 04/28/2023   Stage 3 chronic kidney disease, unspecified whether stage 3a or 3b CKD (HCC)    Thrombocytopenia 03/14/2023   Tremor    Vitamin D  deficiency     Past Surgical History:  Procedure Laterality Date   RIGHT HEART CATH AND CORONARY ANGIOGRAPHY N/A 08/10/2023   Procedure: RIGHT HEART CATH AND CORONARY ANGIOGRAPHY;  Surgeon: Cash Lonni BIRCH, MD;  Location: [facility];  Service: Cardiovascular;  Laterality: N/A;    Family History  Problem Relation Age of Onset   Diabetes Mother    Hypertension Mother    Hyperlipidemia Mother    Hypertension Father    Heart attack Sister    Heart attack Brother     Social History   Socioeconomic History   Marital status: Single    Spouse name: Not on file   Number of children: 1   Years of education: Not on file   Highest education level: Not on file  Occupational History   Occupation: disabled  Tobacco Use   Smoking status: Former    Types: Cigarettes   Smokeless tobacco: Never  Vaping Use   Vaping status: Never Used  Substance and Sexual Activity   Alcohol  use: Not Currently   Drug use: No   Sexual activity: Not Currently  Other Topics Concern   Not on file  Social History Narrative   Not on file   Social Drivers of Health   Tobacco Use: Medium Risk (05/01/2024)   Patient History    Smoking Tobacco Use: Former    Smokeless Tobacco Use: Never    Passive Exposure: Not on file  Financial Resource Strain: Not on file  Food Insecurity: No Food Insecurity (04/26/2023)   Hunger Vital Sign    Worried About Running Out of Food in the Last Year: Never true    Ran Out of Food in the Last Year: Never true   Transportation Needs: No Transportation Needs (04/26/2023)   PRAPARE - Administrator, Civil Service (Medical): No    Lack of Transportation (Non-Medical): No  Physical Activity: Not on file  Stress: Not on file  Social Connections: Not on file  Intimate Partner Violence: Not At Risk (04/26/2023)   Humiliation, Afraid, Rape, and Kick questionnaire    Fear of Current or Ex-Partner: No    Emotionally Abused: No    Physically Abused: No    Sexually Abused: No  Depression (PHQ2-9): Not on file  Alcohol Screen: Not on file  Housing: Low Risk (04/28/2023)   Housing Stability Vital Sign    Unable to Pay for Housing in the Last Year: No    Number of Times Moved in the Last Year: 0    Homeless in the Last Year: No  Utilities: Not At Risk (04/26/2023)   AHC Utilities    Threatened with loss of utilities: No  Health Literacy: Not on file    Prior to Admission medications  Medication Sig Start Date End Date Taking? Authorizing Provider  acetaminophen  (TYLENOL ) 500 MG tablet Take 500 mg by mouth every 6 (six) hours as needed (pain.).   Yes [provider]  amLODipine  (NORVASC ) 2.5 MG tablet Take 1 tablet (2.5 mg total) by mouth daily. 09/03/23 05/01/24 Yes Madireddy, Alean SAUNDERS, MD  aspirin  EC 81 MG tablet Take 1 tablet (81 mg total) by mouth daily. Swallow whole. 05/08/23  Yes Lenon Marien CROME, MD  carvedilol  (COREG ) 6.25 MG tablet Take 1 tablet (6.25 mg total) by mouth 2 (two) times daily with a meal. 05/07/23  Yes Lenon Marien CROME, MD  cyanocobalamin  (VITAMIN B12) 1000 MCG tablet Take 1 tablet (1,000 mcg total) by mouth daily. 10/21/23  Yes Camara, Amadou, MD  famotidine  (PEPCID ) 20 MG tablet Take 1 tablet (20 mg total) by mouth daily. 03/19/23  Yes Singh, Prashant K, MD  losartan (COZAAR) 50 MG tablet Take 50 mg by mouth daily. 04/24/24  Yes [provider]  rosuvastatin  (CRESTOR ) 10 MG tablet Take 1 tablet (10 mg total) by mouth daily. 10/21/23  Yes Camara,  Amadou, MD  tiZANidine (ZANAFLEX) 2 MG tablet Take 2 mg by mouth 3 (three) times daily as needed. 01/03/24  Yes [provider]  Vitamin D , Ergocalciferol , (DRISDOL ) 1.25 MG (50000 UNIT) CAPS capsule Take 50,000 Units by mouth every Wednesday. 07/29/23  Yes [provider]    Current Outpatient Medications  Medication Sig Dispense Refill   acetaminophen  (TYLENOL ) 500 MG tablet Take 500 mg by mouth every 6 (six) hours as needed (pain.).     amLODipine  (NORVASC ) 2.5 MG tablet Take 1 tablet (2.5 mg total) by mouth daily. 90 tablet 3   aspirin  EC 81 MG tablet Take 1 tablet (81 mg total) by  mouth daily. Swallow whole.     carvedilol  (COREG ) 6.25 MG tablet Take 1 tablet (6.25 mg total) by mouth 2 (two) times daily with a meal.     cyanocobalamin  (VITAMIN B12) 1000 MCG tablet Take 1 tablet (1,000 mcg total) by mouth daily. 90 tablet 3   famotidine  (PEPCID ) 20 MG tablet Take 1 tablet (20 mg total) by mouth daily.     losartan (COZAAR) 50 MG tablet Take 50 mg by mouth daily.     rosuvastatin  (CRESTOR ) 10 MG tablet Take 1 tablet (10 mg total) by mouth daily. 90 tablet 1   tiZANidine (ZANAFLEX) 2 MG tablet Take 2 mg by mouth 3 (three) times daily as needed.     Vitamin D , Ergocalciferol , (DRISDOL ) 1.25 MG (50000 UNIT) CAPS capsule Take 50,000 Units by mouth every Wednesday.     No current facility-administered medications for this visit.    Allergies[1]    Review of Systems:   General:  normal appetite, normal energy, no weight gain, no weight loss, no fever  Cardiac:  no chest pain with exertion, no chest pain at rest, noSOB with exertion, no resting SOB, no PND, no orthopnea, no palpitations, no arrhythmia, no atrial fibrillation, no LE edema, no dizzy spells, no syncope  Respiratory:  no shortness of breath, no home oxygen, no productive cough, no dry cough, no bronchitis, no wheezing, no hemoptysis, no asthma, no pain with inspiration or cough, no sleep apnea, no CPAP at  night  GI:   no difficulty swallowing, no reflux, no frequent heartburn, no hiatal hernia, no abdominal pain, no constipation, no diarrhea, no hematochezia, no hematemesis, no melena  GU:   no dysuria,  no frequency, no urinary tract infection, no hematuria, no enlarged prostate, no kidney stones, no kidney disease  Vascular:  no pain suggestive of claudication, no pain in feet, no leg cramps, no varicose veins, no DVT, no non-healing foot ulcer  Neuro:   no stroke, no TIA's, no seizures, no headaches, no temporary blindness one eye,  no slurred speech, no peripheral neuropathy, no chronic pain, no instability of gait, no memory/cognitive dysfunction  Musculoskeletal: no arthritis, no joint swelling, no myalgias, no difficulty walking, normal mobility   Skin:   no rash, no itching, no skin infections, no pressure sores or ulcerations  Psych:   no anxiety, no depression, no nervousness, no unusual recent stress  Eyes:   no blurry vision, no floaters, no recent vision changes, + wears glasses  ENT:   no hearing loss, edentulous, no dentures  Hematologic:  no easy bruising, no abnormal bleeding, no clotting disorder, no frequent epistaxis  Endocrine:  no diabetes, does not check CBG's at home     Physical Exam:   BP (!) 165/78   Pulse 72   Resp 20   Ht 5' 9 (1.753 m)   Wt 162 lb (73.5 kg)   SpO2 96% Comment: RA  BMI 23.92 kg/m   General:  well-appearing  HEENT:  Unremarkable, NCAT, PERLA, EOMI  Neck:   no JVD, no bruits, no adenopathy   Chest:   clear to auscultation, symmetrical breath sounds, no wheezes, no rhonchi   CV:   RRR, 3/6 systolic murmur RSB, no diastolic murmur  Abdomen:  soft, non-tender, no masses   Extremities:  warm, well-perfused, pedal pulses palpable,  lower extremity edema  Rectal/GU  Deferred  Neuro:   Grossly non-focal and symmetrical throughout  Skin:   Clean and dry, no rashes, no breakdown  Diagnostic  Tests:  ECHOCARDIOGRAM REPORT       Patient Name:    Steve Kelly Date of Exam: 02/16/2024  Medical Rec #:  [id number]        Height:       69.0 in  Accession #:    [id number]       Weight:       161.0 lb  Date of Birth:  [birth year]        BSA:          1.884 m  Patient Age:    68 years         BP:           170/106 mmHg  Patient Gender: M                HR:           67 bpm.  Exam Location:  [address]   Procedure: 2D Echo, Cardiac Doppler, Color Doppler and Strain Analysis  (Both            Spectral and Color Flow Doppler were utilized during  procedure).   Indications:   I35.0 Aortic Stenosis    History:        Patient has prior history of Echocardiogram examinations,  most                 recent 04/25/2023. Myocardial injury, CKD, alcohol abuse;  Risk                 Factors:Hypertension and HLD.    Sonographer:    Waldo Guadalajara RCS  Referring Phys: [id number] CHRISTOPHER D MCALHANY   IMPRESSIONS     1. Left ventricular ejection fraction, by estimation, is 60 to 65%. The  left ventricle has normal function. The left ventricle has no regional  wall motion abnormalities. There is mild concentric left ventricular  hypertrophy. Left ventricular diastolic  parameters are consistent with Grade I diastolic dysfunction (impaired  relaxation). The average left ventricular global longitudinal strain is  -16.6 %. The global longitudinal strain is abnormal.   2. Right ventricular systolic function is normal. The right ventricular  size is normal. There is normal pulmonary artery systolic pressure.   3. The mitral valve is normal in structure. No evidence of mitral valve  regurgitation. No evidence of mitral stenosis.   4. Progression of severe valvular stenosis since previous echo. The  aortic valve is calcified. There is moderate calcification of the aortic  valve. There is moderate thickening of the aortic valve. Aortic valve  regurgitation is moderate. Severe aortic  valve stenosis. Aortic valve area, by VTI measures 0.69 cm.  Aortic valve  mean gradient measures 42.0 mmHg. Aortic valve Vmax measures 4.10 m/s.   5. The inferior vena cava is normal in size with greater than 50%  respiratory variability, suggesting right atrial pressure of 3 mmHg.   FINDINGS   Left Ventricle: Left ventricular ejection fraction, by estimation, is 60  to 65%. The left ventricle has normal function. The left ventricle has no  regional wall motion abnormalities. The average left ventricular global  longitudinal strain is -16.6 %.  Strain was performed and the global longitudinal strain is abnormal. The  left ventricular internal cavity size was normal in size. There is mild  concentric left ventricular hypertrophy. Left ventricular diastolic  parameters are consistent with Grade I  diastolic dysfunction (impaired relaxation).   Right Ventricle: The right ventricular size is normal. No increase in  right  ventricular wall thickness. Right ventricular systolic function is  normal. There is normal pulmonary artery systolic pressure. The tricuspid  regurgitant velocity is 2.45 m/s, and   with an assumed right atrial pressure of 3 mmHg, the estimated right  ventricular systolic pressure is 27.0 mmHg.   Left Atrium: Left atrial size was normal in size.   Right Atrium: Right atrial size was normal in size.   Pericardium: There is no evidence of pericardial effusion.   Mitral Valve: The mitral valve is normal in structure. No evidence of  mitral valve regurgitation. No evidence of mitral valve stenosis.   Tricuspid Valve: The tricuspid valve is normal in structure. Tricuspid  valve regurgitation is mild . No evidence of tricuspid stenosis.   Aortic Valve: Progression of severe valvular stenosis since previous echo.  The aortic valve is calcified. There is moderate calcification of the  aortic valve. There is moderate thickening of the aortic valve. Aortic  valve regurgitation is moderate.  Aortic regurgitation PHT measures 396  msec. Severe aortic stenosis is  present. Aortic valve mean gradient measures 42.0 mmHg. Aortic valve peak  gradient measures 67.2 mmHg. Aortic valve area, by VTI measures 0.69 cm.   Pulmonic Valve: The pulmonic valve was normal in structure. Pulmonic valve  regurgitation is mild. No evidence of pulmonic stenosis.   Aorta: The aortic root is normal in size and structure.   Venous: The inferior vena cava is normal in size with greater than 50%  respiratory variability, suggesting right atrial pressure of 3 mmHg.   IAS/Shunts: No atrial level shunt detected by color flow Doppler.     LEFT VENTRICLE  PLAX 2D  LVIDd:         4.00 cm   Diastology  LVIDs:         2.70 cm   LV e' medial:    6.96 cm/s  LV PW:         1.30 cm   LV E/e' medial:  13.2  LV IVS:        1.30 cm   LV e' lateral:   6.96 cm/s  LVOT diam:     2.00 cm   LV E/e' lateral: 13.2  LV SV:         72  LV SV Index:   38        2D Longitudinal Strain  LVOT Area:     3.14 cm  2D Strain GLS (A4C):   -16.5 %  LV IVRT:       120 msec  2D Strain GLS (A3C):   -14.5 %                           2D Strain GLS (A2C):   -18.8 %                           2D Strain GLS Avg:     -16.6 %   RIGHT VENTRICLE  RV Basal diam:  3.70 cm     PULMONARY VEINS  RV S prime:     15.70 cm/s  A Reversal Velocity: 31.30 cm/s  TAPSE (M-mode): 2.3 cm      Diastolic Velocity:  43.10 cm/s  RVSP:           27.0 mmHg   S/D Velocity:        1.40  Systolic Velocity:   61.10 cm/s   LEFT ATRIUM             Index        RIGHT ATRIUM           Index  LA diam:        4.10 cm 2.18 cm/m   RA Pressure: 3.00 mmHg  LA Vol (A2C):   69.3 ml 36.78 ml/m  RA Area:     15.40 cm  LA Vol (A4C):   37.2 ml 19.74 ml/m  RA Volume:   39.60 ml  21.02 ml/m  LA Biplane Vol: 52.0 ml 27.60 ml/m   AORTIC VALVE  AV Area (Vmax):    0.62 cm  AV Area (Vmean):   0.63 cm  AV Area (VTI):     0.69 cm  AV Vmax:           410.00 cm/s  AV Vmean:           303.000 cm/s  AV VTI:            1.050 m  AV Peak Grad:      67.2 mmHg  AV Mean Grad:      42.0 mmHg  LVOT Vmax:         80.60 cm/s  LVOT Vmean:        60.600 cm/s  LVOT VTI:          0.229 m  LVOT/AV VTI ratio: 0.22  AI PHT:            396 msec    AORTA  Ao Root diam: 3.20 cm  Ao Asc diam:  3.50 cm   MITRAL VALVE               TRICUSPID VALVE  MV Area (PHT):             TR Peak grad:   24.0 mmHg  MV Decel Time:             TR Vmax:        245.00 cm/s  MV E velocity: 92.20 cm/s  Estimated RAP:  3.00 mmHg  MV A velocity: 88.00 cm/s  RVSP:           27.0 mmHg  MV E/A ratio:  1.05                             SHUNTS                             Systemic VTI:  0.23 m                             Systemic Diam: 2.00 cm   Morene Brownie  Electronically signed by Morene Brownie  Signature Date/Time: 02/16/2024/10:39:43 PM        Final      rocedures  RIGHT HEART CATH AND CORONARY ANGIOGRAPHY   Conclusion  No angiographic evidence of CAD Normal right heart pressures   Recommendations: Will continue workup for TAVR.     ADDENDUM REPORT: 04/03/2024 22:44   EXAM: OVER-READ INTERPRETATION  CT CHEST   The following report is an over-read performed by radiologist Dr. Oneil Devonshire of [facility] on 04/03/2024. This over-read does not include interpretation of cardiac or coronary anatomy or pathology. The coronary calcium  score/coronary CTA interpretation  by the cardiologist is attached.   COMPARISON:  None.   FINDINGS: Cardiovascular: Minimal calcification of the descending thoracic aorta is noted. No dissection is seen. Pulmonary artery as visualized is within normal limits.   Mediastinum/Nodes: There are no enlarged lymph nodes within the visualized mediastinum.   Lungs/Pleura: There is no pleural effusion. The visualized lungs appear clear.   Upper abdomen: No significant findings in the visualized upper abdomen.   Musculoskeletal/Chest wall: No  chest wall mass or suspicious osseous findings within the visualized chest.   IMPRESSION: Aortic Atherosclerosis (ICD10-I70.0).     Electronically Signed   By: Oneil Devonshire M.D.   On: 04/03/2024 22:44    Addended by Devonshire Oneil, MD on 04/03/2024 10:47 PM    Study Result  Narrative & Impression  CLINICAL DATA:   with severe aortic stenosis being evaluated for a TAVR procedure.   EXAM: Cardiac TAVR CT   TECHNIQUE: A non-contrast, gated CT scan was obtained with axial slices of 2.5 mm through the heart for aortic valve scoring. A 120 kV retrospective, gated, contrast cardiac scan was obtained. Gantry rotation speed was 230 msec and collimation was 0.63 mm. Nitroglycerin was not given. A delayed scan was obtained to exclude left atrial appendage thrombus. The 3D dataset was reconstructed in systole with motion correction. The 3D data set was reconstructed in 5% intervals of the 0-95% of the R-R cycle. Systolic and diastolic phases were analyzed on a dedicated workstation using MPR, MIP, and VRT modes. The patient received 100 cc of contrast.   FINDINGS: Aortic Root:   Aortic valve: Bicuspid with fusion of right and noncoronary cusps   Aortic valve calcium  score: 2640   Aortic annulus:   Diameter: 25mm x 23mm   Perimeter: 75mm   Area: 449 mm^2   Calcifications: No calcifications   Coronary height: Min Left - 16mm; Min Right - 18mm   Sinotubular height: Left cusp - 25mm; Right cusp - 26mm; Noncoronary cusp - 27mm   LVOT (as measured 3 mm below the annulus):   Diameter: 27mm x 23mm   Area: 450 mm^2   Calcifications: No calcifications   Aortic sinus width: Left cusp - 35mm; Right cusp - 32mm; Noncoronary cusp - 35mm   Sinotubular junction width: 30mm x 30mm   Optimum Fluoroscopic Angle for Delivery: LAO 15 CRA 2   Cardiac:   Right atrium: Normal size   Right ventricle: Mild enlargement   Pulmonary arteries: Normal size   Pulmonary veins: Normal  configuration   Left atrium: Normal size   Left ventricle: Normal size   Pericardium: Normal thickness   Coronary arteries: Coronary calcium  score 0   IMPRESSION: 1. Bicuspid aortic valve with fusion of right and noncoronary cusps. Severe aortic valve calcifications (AV calcium  score 2640)   2. Aortic annulus measures 25mm x 23mm in diameter with perimeter 75mm and area 449 mm^2. No annular or LVOT calcifications. Annular measurements are suitable for delivery of 26mm Edwards Sapien 3 valve   3. Sufficient coronary to annulus distance.   4. Optimum Fluoroscopic Angle for Delivery:  LAO 15 CRA 2   5. Coronary calcium  score 0   Electronically Signed: By: Lonni Nanas M.D. On: 03/29/2024 18:25      Narrative & Impression  CLINICAL DATA:  Aortic valve replacement, TAVR. Preoperative evaluation.   EXAM: CT ANGIOGRAPHY CHEST, ABDOMEN AND PELVIS   TECHNIQUE: Non-contrast CT of the chest was initially obtained.   Multidetector CT imaging through the chest, abdomen and  pelvis was performed using the standard protocol during bolus administration of intravenous contrast. Multiplanar reconstructed images and MIPs were obtained and reviewed to evaluate the vascular anatomy.   RADIATION DOSE REDUCTION: This exam was performed according to the departmental dose-optimization program which includes automated exposure control, adjustment of the mA and/or kV according to patient size and/or use of iterative reconstruction technique.   CONTRAST:  OMNIPAQUE  IOHEXOL  350 MG/ML SOLN   COMPARISON:  None Available.   FINDINGS: CTA CHEST FINDINGS   Cardiovascular: The heart is enlarged and there is no pericardial effusion. There is atherosclerotic calcification of the aorta and aortic valve without evidence of aneurysm or dissection. The pulmonary trunk is normal in caliber. The aortic arch has a standard configuration.   Mediastinum/Nodes: No mediastinal, hilar,  or axillary lymphadenopathy by size criteria. The thyroid gland, trachea, and esophagus are within normal limits.   Lungs/Pleura: Minimal atelectasis is noted bilaterally. No effusion or pneumothorax is seen. There is a 3 mm nodule in the right upper lobe, axial image 34.   Musculoskeletal: Old healed rib fractures are noted bilaterally. Degenerative changes are present in the thoracic spine. No acute osseous abnormality.   Review of the MIP images confirms the above findings.   CTA ABDOMEN AND PELVIS FINDINGS   VASCULAR   Aorta: Normal caliber aorta without aneurysm, dissection, vasculitis or significant stenosis. Minimal aortic atherosclerosis.   Celiac: Celiac trunk is absent. The hepatic and splenic arteries originate from the superior mesenteric artery, normal variant.   SMA: Patent without evidence of aneurysm, dissection, vasculitis or significant stenosis.   Renals: Both renal arteries are patent without evidence of aneurysm, dissection, vasculitis, fibromuscular dysplasia or significant stenosis.   IMA: Patent without evidence of aneurysm, dissection, vasculitis or significant stenosis.   Inflow: Patent without evidence of aneurysm, dissection, vasculitis or significant stenosis.   Veins: No obvious venous abnormality within the limitations of this arterial phase study.   Review of the MIP images confirms the above findings.   NON-VASCULAR   Hepatobiliary: There is a 8 mm hypervascular focus in the posterior right lobe of the liver, suggesting hemangioma. The gallbladder is without stones. No biliary ductal dilatation.   Pancreas: Unremarkable. No pancreatic ductal dilatation or surrounding inflammatory changes.   Spleen: Normal in size without focal abnormality.   Adrenals/Urinary Tract: The adrenal glands are within normal limits. No renal calculus or hydronephrosis bilaterally. The bladder is unremarkable.   Stomach/Bowel: The stomach is within  normal limits. No bowel obstruction, free air, or pneumatosis is seen. Scattered diverticula are noted along the colon without evidence of diverticulitis. Appendix is prominent in size at 8 mm, however air is seen in the appendix and there are no surrounding inflammatory changes, likely reflecting normal variant.   Lymphatic: No abdominal or pelvic lymphadenopathy.   Reproductive: Prostate gland is enlarged.   Other: No abdominopelvic ascites. A fat containing umbilical hernia is noted.   Musculoskeletal: Degenerative changes are present in the lumbar spine. No acute osseous abnormality.   Review of the MIP images confirms the above findings.   IMPRESSION: 1. Minimal aortic atherosclerosis without evidence aneurysm or dissection. 2. 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 3. Cardiomegaly. 4. 3 mm right upper lobe pulmonary nodule. No follow-up needed if patient is low-risk.This recommendation follows the consensus statement: Guidelines for Management of Incidental Pulmonary Nodules Detected on CT Images: From the Fleischner Society 2017; Radiology 2017; 284:228-243. 5. Enlarged prostate gland.  Electronically Signed   By: Leita Birmingham M.D.   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  I have personally reviewed his 2D echocardiogram, cardiac catheterization, and CTA studies.  His echo shows a severely calcified aortic valve with restricted leaflet mobility.  The mean gradient was 42 mmHg with a valve area of 0.69 cm and a dimensionless index of 0.22 consistent with severe aortic insufficiency.  AI pressure half-time was 396 ms consistent with moderate aortic insufficiency.  Cardiac catheterization shows no angiographic evidence of coronary disease.  I agree that aortic valve replacement is indicated in this patient to  prevent progressive left ventricular dysfunction.  He has had severe aortic insufficiency since at least 04/25/2023 when it was seen on echocardiogram.  He is not very active, on disability, and has a history of heavy alcohol abuse and I think transcatheter aortic valve replacement be the best option for treating him.  His gated cardiac CTA shows anatomy suitable for TAVR using a SAPIEN 3 valve.  His abdominal and pelvic CTA shows adequate pelvic vascular anatomy to allow transfemoral insertion.  The patient was counseled at length regarding treatment alternatives for management of severe symptomatic aortic stenosis. The risks and benefits of surgical intervention has been discussed in detail. Long-term prognosis with medical therapy was discussed. Alternative approaches such as conventional surgical aortic valve replacement, transcatheter aortic valve replacement, and palliative medical therapy were compared and contrasted at length. This discussion was placed in the context of the patient's own specific clinical presentation and past medical history. All of his questions have been addressed.   Following the decision to proceed with transcatheter aortic valve replacement, a discussion was held regarding what types of management strategies would be attempted intraoperatively in the event of life-threatening complications, including whether or not the patient would be considered a candidate for the use of cardiopulmonary bypass and/or conversion to open sternotomy for attempted surgical intervention.  I think he would be a candidate for emergent sternotomy to manage any intraoperative complications.  The patient has been advised of a variety of complications that might develop including but not limited to risks of death, stroke, paravalvular leak, aortic dissection or other major vascular complications, aortic annulus rupture, device embolization, cardiac rupture or perforation, mitral regurgitation, acute  myocardial infarction, arrhythmia, heart block or bradycardia requiring permanent pacemaker placement, congestive heart failure, respiratory failure, renal failure, pneumonia, infection, other late complications related to structural valve deterioration or migration, or other complications that might ultimately cause a temporary or permanent loss of functional independence or other long term morbidity. The patient provides full informed consent for the procedure as described and all questions were answered.      Plan:  He will be scheduled for transfemoral TAVR using a SAPIEN 3 valve on 05/30/2024.  I spent 60 minutes performing this consultation and > 50% of this time was spent face to face counseling and coordinating the care of this patient's severe aortic stenosis and moderate aortic insufficiency.   Dorise LOIS Fellers, MD 05/01/2024        [1] No Known Allergies

## 2024-05-17 ENCOUNTER — Other Ambulatory Visit: Payer: Self-pay

## 2024-05-17 DIAGNOSIS — I350 Nonrheumatic aortic (valve) stenosis: Secondary | ICD-10-CM

## 2024-05-26 ENCOUNTER — Ambulatory Visit (HOSPITAL_COMMUNITY)
Admission: RE | Admit: 2024-05-26 | Discharge: 2024-05-26 | Disposition: A | Discharge status: Home / Self Care | Source: Ambulatory Visit | Attending: Cardiovascular Disease | Admitting: Cardiovascular Disease

## 2024-05-26 ENCOUNTER — Other Ambulatory Visit: Payer: Self-pay

## 2024-05-26 ENCOUNTER — Encounter (HOSPITAL_COMMUNITY)
Admission: RE | Admit: 2024-05-26 | Discharge: 2024-05-26 | Disposition: A | Discharge status: Home / Self Care | Source: Ambulatory Visit | Attending: Cardiovascular Disease | Admitting: Cardiovascular Disease

## 2024-05-26 DIAGNOSIS — I350 Nonrheumatic aortic (valve) stenosis: Secondary | ICD-10-CM | POA: Diagnosis not present

## 2024-05-26 DIAGNOSIS — Z01818 Encounter for other preprocedural examination: Secondary | ICD-10-CM | POA: Insufficient documentation

## 2024-05-26 LAB — CBC
HCT: 41 % (ref 39.0–52.0)
Hemoglobin: 14.4 g/dL (ref 13.0–17.0)
MCH: 32.1 pg (ref 26.0–34.0)
MCHC: 35.1 g/dL (ref 30.0–36.0)
MCV: 91.3 fL (ref 80.0–100.0)
Platelets: 211 K/uL (ref 150–400)
RBC: 4.49 MIL/uL (ref 4.22–5.81)
RDW: 13 % (ref 11.5–15.5)
WBC: 5.7 K/uL (ref 4.0–10.5)
nRBC: 0 % (ref 0.0–0.2)

## 2024-05-26 LAB — COMPREHENSIVE METABOLIC PANEL WITH GFR
ALT: 23 U/L (ref 0–44)
AST: 26 U/L (ref 15–41)
Albumin: 4.5 g/dL (ref 3.5–5.0)
Alkaline Phosphatase: 100 U/L (ref 38–126)
Anion gap: 9 (ref 5–15)
BUN: 13 mg/dL (ref 8–23)
CO2: 25 mmol/L (ref 22–32)
Calcium: 9.8 mg/dL (ref 8.9–10.3)
Chloride: 104 mmol/L (ref 98–111)
Creatinine, Ser: 1.07 mg/dL (ref 0.61–1.24)
GFR, Estimated: >60 mL/min
Glucose, Bld: 105 mg/dL — ABNORMAL HIGH (ref 70–99)
Potassium: 4.5 mmol/L (ref 3.5–5.1)
Sodium: 137 mmol/L (ref 135–145)
Total Bilirubin: 0.6 mg/dL (ref 0.0–1.2)
Total Protein: 8.3 g/dL — ABNORMAL HIGH (ref 6.5–8.1)

## 2024-05-26 LAB — URINALYSIS, ROUTINE W REFLEX MICROSCOPIC
Bilirubin Urine: NEGATIVE
Glucose, UA: NEGATIVE mg/dL
Hgb urine dipstick: NEGATIVE
Ketones, ur: NEGATIVE mg/dL
Leukocytes,Ua: NEGATIVE
Nitrite: NEGATIVE
Protein, ur: NEGATIVE mg/dL
Specific Gravity, Urine: 1.023 (ref 1.005–1.030)
pH: 5 (ref 5.0–8.0)

## 2024-05-26 LAB — TYPE AND SCREEN
ABO/RH(D): A POS
Antibody Screen: NEGATIVE

## 2024-05-26 LAB — SURGICAL PCR SCREEN
MRSA, PCR: NEGATIVE
Staphylococcus aureus: NEGATIVE

## 2024-05-26 LAB — PROTIME-INR
INR: 1 (ref 0.8–1.2)
Prothrombin Time: 14 s (ref 11.4–15.2)

## 2024-05-26 NOTE — Progress Notes (Signed)
 All consents signed by patient at PAT lab appointment. Pt was sent home with printed copy of surgical instructions and CHG soap/CHG soap instructions. All instructions reviewed with patient and questions answered.  Patients chart send to anesthesia for review. Pt denies any respiratory illness/infection in the last two months.

## 2024-05-29 MED ORDER — POTASSIUM CHLORIDE 2 MEQ/ML IV SOLN
80.0000 meq | INTRAVENOUS | Status: DC
  Filled 2024-05-29: qty 40

## 2024-05-29 MED ORDER — NOREPINEPHRINE 4 MG/250ML-% IV SOLN
0.0000 ug/min | INTRAVENOUS | Status: AC
  Administered 2024-05-30: 2 ug/min via INTRAVENOUS
  Filled 2024-05-29: qty 250

## 2024-05-29 MED ORDER — HEPARIN 30,000 UNITS/1000 ML (OHS) CELLSAVER SOLUTION
Status: DC
  Filled 2024-05-29: qty 1000

## 2024-05-29 MED ORDER — MAGNESIUM SULFATE 50 % IJ SOLN
40.0000 meq | INTRAMUSCULAR | Status: DC
  Filled 2024-05-29: qty 9.85

## 2024-05-29 MED ORDER — DEXMEDETOMIDINE HCL IN NACL 400 MCG/100ML IV SOLN
0.1000 ug/kg/h | INTRAVENOUS | Status: AC
  Administered 2024-05-30: 1 ug/kg/h via INTRAVENOUS
  Filled 2024-05-29: qty 100

## 2024-05-29 MED ORDER — CEFAZOLIN SODIUM-DEXTROSE 2-4 GM/100ML-% IV SOLN
2.0000 g | INTRAVENOUS | Status: AC
  Administered 2024-05-30: 2 g via INTRAVENOUS
  Filled 2024-05-29: qty 100

## 2024-05-29 NOTE — H&P (Signed)
 "  [address] [place] [address]             [phone]     CARDIOTHORACIC SURGERY ADMISSION HISTORY AND PHYSICAL   PCP is Allen, Chad, NP Referring Provider is Lonni Cash, MD Primary Cardiologist is Alean SAUNDERS Madireddy, MD   Reason for admission: Severe aortic stenosis   HPI:   The patient is a 69 year old gentleman with history of hypertension, hyperlipidemia, EtOH abuse in the past with admission in 04/2023 for withdrawal, former smoker, and severe aortic stenosis who was referred for consideration of TAVR.  A 2D echocardiogram in 04/2023 showed a mean aortic valve gradient of 40 mmHg with a valve area of 0.74 cm.  This was felt to be consistent with severe aortic stenosis.  Left ventricular ejection fraction was 55 to 60% with grade 1 diastolic dysfunction.  He subsequently underwent catheterization in April 2025 showing no evidence of coronary disease and normal right heart pressures.  Repeat echocardiogram on 02/16/2024 showed a severely calcified and restricted aortic valve with a mean gradient of 42 mmHg and a valve area by VTI of 0.69 cm.  Left ventricular ejection fraction was 60 to 65% with mild concentric LVH and grade 1 diastolic dysfunction.  There was moderate aortic insufficiency.   The patient is disabled and lives with his brother in [place].  He does things around the house but is otherwise not very active.  He denies any chest pressure or pain.  He denies shortness of breath.  He has had no dizziness or syncope.  He denies orthopnea and peripheral edema.       Past Medical History:  Diagnosis Date   Alcohol abuse     Alcohol withdrawal (HCC) 03/14/2023   Aortic stenosis, severe     CAP (community acquired pneumonia) 04/25/2023   Chronic alcohol abuse     Delirium tremens (HCC) 04/23/2023   Generalized weakness 03/14/2023   GERD (gastroesophageal reflux disease) 04/23/2023   History of alcohol use disorder 03/14/2023    History of cerebral infarction     Hyperlipidemia     Hypertension     Hypokalemia 03/14/2023   Leukocytosis 04/23/2023   Metabolic encephalopathy 04/23/2023   Myocardial injury 04/23/2023   Overweight with body mass index (BMI) of 25 to 25.9 in adult     Slurred speech 04/28/2023   Stage 3 chronic kidney disease, unspecified whether stage 3a or 3b CKD (HCC)     Thrombocytopenia 03/14/2023   Tremor     Vitamin D  deficiency                 Past Surgical History:  Procedure Laterality Date   RIGHT HEART CATH AND CORONARY ANGIOGRAPHY N/A 08/10/2023    Procedure: RIGHT HEART CATH AND CORONARY ANGIOGRAPHY;  Surgeon: Cash Lonni BIRCH, MD;  Location: [facility];  Service: Cardiovascular;  Laterality: N/A;               Family History  Problem Relation Age of Onset   Diabetes Mother     Hypertension Mother     Hyperlipidemia Mother     Hypertension Father     Heart attack Sister     Heart attack Brother            Social History         Socioeconomic History   Marital status: Single      Spouse name: Not on file   Number  of children: 1   Years of education: Not on file   Highest education level: Not on file  Occupational History   Occupation: disabled  Tobacco Use   Smoking status: Former      Types: Cigarettes   Smokeless tobacco: Never  Vaping Use   Vaping status: Never Used  Substance and Sexual Activity   Alcohol use: Not Currently   Drug use: No   Sexual activity: Not Currently  Other Topics Concern   Not on file  Social History Narrative   Not on file    Social Drivers of Health        Tobacco Use: Medium Risk (05/01/2024)    Patient History     Smoking Tobacco Use: Former     Smokeless Tobacco Use: Never     Passive Exposure: Not on Actuary Strain: Not on file  Food Insecurity: No Food Insecurity (04/26/2023)    Hunger Vital Sign     Worried About Running Out of Food in the Last Year: Never true     Ran Out of  Food in the Last Year: Never true  Transportation Needs: No Transportation Needs (04/26/2023)    PRAPARE - Therapist, Art (Medical): No     Lack of Transportation (Non-Medical): No  Physical Activity: Not on file  Stress: Not on file  Social Connections: Not on file  Intimate Partner Violence: Not At Risk (04/26/2023)    Humiliation, Afraid, Rape, and Kick questionnaire     Fear of Current or Ex-Partner: No     Emotionally Abused: No     Physically Abused: No     Sexually Abused: No  Depression (PHQ2-9): Not on file  Alcohol Screen: Not on file  Housing: Low Risk (04/28/2023)    Housing Stability Vital Sign     Unable to Pay for Housing in the Last Year: No     Number of Times Moved in the Last Year: 0     Homeless in the Last Year: No  Utilities: Not At Risk (04/26/2023)    AHC Utilities     Threatened with loss of utilities: No  Health Literacy: Not on file             Prior to Admission medications  Medication Sig Start Date End Date Taking? Authorizing Provider  acetaminophen  (TYLENOL ) 500 MG tablet Take 500 mg by mouth every 6 (six) hours as needed (pain.).     Yes [provider]  amLODipine  (NORVASC ) 2.5 MG tablet Take 1 tablet (2.5 mg total) by mouth daily. 09/03/23 05/01/24 Yes Madireddy, Alean SAUNDERS, MD  aspirin  EC 81 MG tablet Take 1 tablet (81 mg total) by mouth daily. Swallow whole. 05/08/23   Yes Lenon Marien CROME, MD  carvedilol  (COREG ) 6.25 MG tablet Take 1 tablet (6.25 mg total) by mouth 2 (two) times daily with a meal. 05/07/23   Yes Lenon Marien CROME, MD  cyanocobalamin  (VITAMIN B12) 1000 MCG tablet Take 1 tablet (1,000 mcg total) by mouth daily. 10/21/23   Yes Camara, Amadou, MD  famotidine  (PEPCID ) 20 MG tablet Take 1 tablet (20 mg total) by mouth daily. 03/19/23   Yes Singh, Prashant K, MD  losartan  (COZAAR ) 50 MG tablet Take 50 mg by mouth daily. 04/24/24   Yes [provider]  rosuvastatin  (CRESTOR ) 10 MG tablet  Take 1 tablet (10 mg total) by mouth daily. 10/21/23   Yes Gregg Lek, MD  tiZANidine  (ZANAFLEX ) 2 MG  tablet Take 2 mg by mouth 3 (three) times daily as needed. 01/03/24   Yes [provider]  Vitamin D , Ergocalciferol , (DRISDOL ) 1.25 MG (50000 UNIT) CAPS capsule Take 50,000 Units by mouth every Wednesday. 07/29/23   Yes [provider]            Current Outpatient Medications  Medication Sig Dispense Refill   acetaminophen  (TYLENOL ) 500 MG tablet Take 500 mg by mouth every 6 (six) hours as needed (pain.).       amLODipine  (NORVASC ) 2.5 MG tablet Take 1 tablet (2.5 mg total) by mouth daily. 90 tablet 3   aspirin  EC 81 MG tablet Take 1 tablet (81 mg total) by mouth daily. Swallow whole.       carvedilol  (COREG ) 6.25 MG tablet Take 1 tablet (6.25 mg total) by mouth 2 (two) times daily with a meal.       cyanocobalamin  (VITAMIN B12) 1000 MCG tablet Take 1 tablet (1,000 mcg total) by mouth daily. 90 tablet 3   famotidine  (PEPCID ) 20 MG tablet Take 1 tablet (20 mg total) by mouth daily.       losartan  (COZAAR ) 50 MG tablet Take 50 mg by mouth daily.       rosuvastatin  (CRESTOR ) 10 MG tablet Take 1 tablet (10 mg total) by mouth daily. 90 tablet 1   tiZANidine  (ZANAFLEX ) 2 MG tablet Take 2 mg by mouth 3 (three) times daily as needed.       Vitamin D , Ergocalciferol , (DRISDOL ) 1.25 MG (50000 UNIT) CAPS capsule Take 50,000 Units by mouth every Wednesday.          No current facility-administered medications for this visit.        [Allergies]  [Allergies] No Known Allergies       Review of Systems:               General:                      normal appetite, normal energy, no weight gain, no weight loss, no fever             Cardiac:                       no chest pain with exertion, no chest pain at rest, noSOB with exertion, no resting SOB, no PND, no orthopnea, no palpitations, no arrhythmia, no atrial fibrillation, no LE edema, no dizzy spells, no syncope              Respiratory:                 no shortness of breath, no home oxygen, no productive cough, no dry cough, no bronchitis, no wheezing, no hemoptysis, no asthma, no pain with inspiration or cough, no sleep apnea, no CPAP at night             GI:                               no difficulty swallowing, no reflux, no frequent heartburn, no hiatal hernia, no abdominal pain, no constipation, no diarrhea, no hematochezia, no hematemesis, no melena             GU:                              no  dysuria,  no frequency, no urinary tract infection, no hematuria, no enlarged prostate, no kidney stones, no kidney disease             Vascular:                     no pain suggestive of claudication, no pain in feet, no leg cramps, no varicose veins, no DVT, no non-healing foot ulcer             Neuro:                         no stroke, no TIA's, no seizures, no headaches, no temporary blindness one eye,  no slurred speech, no peripheral neuropathy, no chronic pain, no instability of gait, no memory/cognitive dysfunction             Musculoskeletal:         no arthritis, no joint swelling, no myalgias, no difficulty walking, normal mobility              Skin:                            no rash, no itching, no skin infections, no pressure sores or ulcerations             Psych:                         no anxiety, no depression, no nervousness, no unusual recent stress             Eyes:                           no blurry vision, no floaters, no recent vision changes, + wears glasses             ENT:                            no hearing loss, edentulous, no dentures             Hematologic:               no easy bruising, no abnormal bleeding, no clotting disorder, no frequent epistaxis             Endocrine:                   no diabetes, does not check CBG's at home                            Physical Exam:               BP (!) 165/78   Pulse 72   Resp 20   Ht 5' 9 (1.753 m)   Wt 162 lb (73.5 kg)   SpO2 96%  Comment: RA  BMI 23.92 kg/m              General:                      well-appearing             HEENT:                       Unremarkable, NCAT,  PERLA, EOMI             Neck:                           no JVD, no bruits, no adenopathy              Chest:                          clear to auscultation, symmetrical breath sounds, no wheezes, no rhonchi              CV:                              RRR, 3/6 systolic murmur RSB, no diastolic murmur             Abdomen:                    soft, non-tender, no masses              Extremities:                 warm, well-perfused, pedal pulses palpable,  lower extremity edema             Rectal/GU                   Deferred             Neuro:                         Grossly non-focal and symmetrical throughout             Skin:                            Clean and dry, no rashes, no breakdown   Diagnostic Tests:   ECHOCARDIOGRAM REPORT       Patient Name:   Steve Kelly Date of Exam: 02/16/2024  Medical Rec #:  [id number]        Height:       69.0 in  Accession #:    [id number]       Weight:       161.0 lb  Date of Birth:  Jan 14, 1956        BSA:          1.884 m  Patient Age:    68 years         BP:           170/106 mmHg  Patient Gender: M                HR:           67 bpm.  Exam Location:  [address]   Procedure: 2D Echo, Cardiac Doppler, Color Doppler and Strain Analysis  (Both            Spectral and Color Flow Doppler were utilized during  procedure).   Indications:   I35.0 Aortic Stenosis    History:        Patient has prior history of Echocardiogram examinations,  most                 recent 04/25/2023. Myocardial injury, CKD, alcohol abuse;  Risk  Factors:Hypertension and HLD.    Sonographer:    Waldo Guadalajara RCS  Referring Phys: [id number] CHRISTOPHER D MCALHANY   IMPRESSIONS     1. Left ventricular ejection fraction, by estimation, is 60 to 65%. The  left ventricle has normal function. The left  ventricle has no regional  wall motion abnormalities. There is mild concentric left ventricular  hypertrophy. Left ventricular diastolic  parameters are consistent with Grade I diastolic dysfunction (impaired  relaxation). The average left ventricular global longitudinal strain is  -16.6 %. The global longitudinal strain is abnormal.   2. Right ventricular systolic function is normal. The right ventricular  size is normal. There is normal pulmonary artery systolic pressure.   3. The mitral valve is normal in structure. No evidence of mitral valve  regurgitation. No evidence of mitral stenosis.   4. Progression of severe valvular stenosis since previous echo. The  aortic valve is calcified. There is moderate calcification of the aortic  valve. There is moderate thickening of the aortic valve. Aortic valve  regurgitation is moderate. Severe aortic  valve stenosis. Aortic valve area, by VTI measures 0.69 cm. Aortic valve  mean gradient measures 42.0 mmHg. Aortic valve Vmax measures 4.10 m/s.   5. The inferior vena cava is normal in size with greater than 50%  respiratory variability, suggesting right atrial pressure of 3 mmHg.   FINDINGS   Left Ventricle: Left ventricular ejection fraction, by estimation, is 60  to 65%. The left ventricle has normal function. The left ventricle has no  regional wall motion abnormalities. The average left ventricular global  longitudinal strain is -16.6 %.  Strain was performed and the global longitudinal strain is abnormal. The  left ventricular internal cavity size was normal in size. There is mild  concentric left ventricular hypertrophy. Left ventricular diastolic  parameters are consistent with Grade I  diastolic dysfunction (impaired relaxation).   Right Ventricle: The right ventricular size is normal. No increase in  right ventricular wall thickness. Right ventricular systolic function is  normal. There is normal pulmonary artery systolic  pressure. The tricuspid  regurgitant velocity is 2.45 m/s, and   with an assumed right atrial pressure of 3 mmHg, the estimated right  ventricular systolic pressure is 27.0 mmHg.   Left Atrium: Left atrial size was normal in size.   Right Atrium: Right atrial size was normal in size.   Pericardium: There is no evidence of pericardial effusion.   Mitral Valve: The mitral valve is normal in structure. No evidence of  mitral valve regurgitation. No evidence of mitral valve stenosis.   Tricuspid Valve: The tricuspid valve is normal in structure. Tricuspid  valve regurgitation is mild . No evidence of tricuspid stenosis.   Aortic Valve: Progression of severe valvular stenosis since previous echo.  The aortic valve is calcified. There is moderate calcification of the  aortic valve. There is moderate thickening of the aortic valve. Aortic  valve regurgitation is moderate.  Aortic regurgitation PHT measures 396 msec. Severe aortic stenosis is  present. Aortic valve mean gradient measures 42.0 mmHg. Aortic valve peak  gradient measures 67.2 mmHg. Aortic valve area, by VTI measures 0.69 cm.   Pulmonic Valve: The pulmonic valve was normal in structure. Pulmonic valve  regurgitation is mild. No evidence of pulmonic stenosis.   Aorta: The aortic root is normal in size and structure.   Venous: The inferior vena cava is normal in size with greater than 50%  respiratory variability, suggesting right atrial pressure of 3  mmHg.   IAS/Shunts: No atrial level shunt detected by color flow Doppler.     LEFT VENTRICLE  PLAX 2D  LVIDd:         4.00 cm   Diastology  LVIDs:         2.70 cm   LV e' medial:    6.96 cm/s  LV PW:         1.30 cm   LV E/e' medial:  13.2  LV IVS:        1.30 cm   LV e' lateral:   6.96 cm/s  LVOT diam:     2.00 cm   LV E/e' lateral: 13.2  LV SV:         72  LV SV Index:   38        2D Longitudinal Strain  LVOT Area:     3.14 cm  2D Strain GLS (A4C):   -16.5 %  LV  IVRT:       120 msec  2D Strain GLS (A3C):   -14.5 %                           2D Strain GLS (A2C):   -18.8 %                           2D Strain GLS Avg:     -16.6 %   RIGHT VENTRICLE  RV Basal diam:  3.70 cm     PULMONARY VEINS  RV S prime:     15.70 cm/s  A Reversal Velocity: 31.30 cm/s  TAPSE (M-mode): 2.3 cm      Diastolic Velocity:  43.10 cm/s  RVSP:           27.0 mmHg   S/D Velocity:        1.40                              Systolic Velocity:   61.10 cm/s   LEFT ATRIUM             Index        RIGHT ATRIUM           Index  LA diam:        4.10 cm 2.18 cm/m   RA Pressure: 3.00 mmHg  LA Vol (A2C):   69.3 ml 36.78 ml/m  RA Area:     15.40 cm  LA Vol (A4C):   37.2 ml 19.74 ml/m  RA Volume:   39.60 ml  21.02 ml/m  LA Biplane Vol: 52.0 ml 27.60 ml/m   AORTIC VALVE  AV Area (Vmax):    0.62 cm  AV Area (Vmean):   0.63 cm  AV Area (VTI):     0.69 cm  AV Vmax:           410.00 cm/s  AV Vmean:          303.000 cm/s  AV VTI:            1.050 m  AV Peak Grad:      67.2 mmHg  AV Mean Grad:      42.0 mmHg  LVOT Vmax:         80.60 cm/s  LVOT Vmean:        60.600 cm/s  LVOT VTI:  0.229 m  LVOT/AV VTI ratio: 0.22  AI PHT:            396 msec    AORTA  Ao Root diam: 3.20 cm  Ao Asc diam:  3.50 cm   MITRAL VALVE               TRICUSPID VALVE  MV Area (PHT):             TR Peak grad:   24.0 mmHg  MV Decel Time:             TR Vmax:        245.00 cm/s  MV E velocity: 92.20 cm/s  Estimated RAP:  3.00 mmHg  MV A velocity: 88.00 cm/s  RVSP:           27.0 mmHg  MV E/A ratio:  1.05                             SHUNTS                             Systemic VTI:  0.23 m                             Systemic Diam: 2.00 cm   Morene Brownie  Electronically signed by Morene Brownie  Signature Date/Time: 02/16/2024/10:39:43 PM        Final        rocedures   RIGHT HEART CATH AND CORONARY ANGIOGRAPHY    Conclusion   No angiographic evidence of CAD Normal right heart  pressures   Recommendations: Will continue workup for TAVR.      ADDENDUM REPORT: 04/03/2024 22:44   EXAM: OVER-READ INTERPRETATION  CT CHEST   The following report is an over-read performed by radiologist Dr. Oneil Devonshire of [facility] on 04/03/2024. This over-read does not include interpretation of cardiac or coronary anatomy or pathology. The coronary calcium  score/coronary CTA interpretation by the cardiologist is attached.   COMPARISON:  None.   FINDINGS: Cardiovascular: Minimal calcification of the descending thoracic aorta is noted. No dissection is seen. Pulmonary artery as visualized is within normal limits.   Mediastinum/Nodes: There are no enlarged lymph nodes within the visualized mediastinum.   Lungs/Pleura: There is no pleural effusion. The visualized lungs appear clear.   Upper abdomen: No significant findings in the visualized upper abdomen.   Musculoskeletal/Chest wall: No chest wall mass or suspicious osseous findings within the visualized chest.   IMPRESSION: Aortic Atherosclerosis (ICD10-I70.0).     Electronically Signed   By: Oneil Devonshire M.D.   On: 04/03/2024 22:44    Addended by Devonshire Oneil, MD on 04/03/2024 10:47 PM    Study Result   Narrative & Impression  CLINICAL DATA:   with severe aortic stenosis being evaluated for a TAVR procedure.   EXAM: Cardiac TAVR CT   TECHNIQUE: A non-contrast, gated CT scan was obtained with axial slices of 2.5 mm through the heart for aortic valve scoring. A 120 kV retrospective, gated, contrast cardiac scan was obtained. Gantry rotation speed was 230 msec and collimation was 0.63 mm. Nitroglycerin  was not given. A delayed scan was obtained to exclude left atrial appendage thrombus. The 3D dataset was reconstructed in systole with motion correction. The 3D data set was reconstructed in 5% intervals of the 0-95% of the  R-R cycle. Systolic and diastolic phases were analyzed on a  dedicated workstation using MPR, MIP, and VRT modes. The patient received 100 cc of contrast.   FINDINGS: Aortic Root:   Aortic valve: Bicuspid with fusion of right and noncoronary cusps   Aortic valve calcium  score: 2640   Aortic annulus:   Diameter: 25mm x 23mm   Perimeter: 75mm   Area: 449 mm^2   Calcifications: No calcifications   Coronary height: Min Left - 16mm; Min Right - 18mm   Sinotubular height: Left cusp - 25mm; Right cusp - 26mm; Noncoronary cusp - 27mm   LVOT (as measured 3 mm below the annulus):   Diameter: 27mm x 23mm   Area: 450 mm^2   Calcifications: No calcifications   Aortic sinus width: Left cusp - 35mm; Right cusp - 32mm; Noncoronary cusp - 35mm   Sinotubular junction width: 30mm x 30mm   Optimum Fluoroscopic Angle for Delivery: LAO 15 CRA 2   Cardiac:   Right atrium: Normal size   Right ventricle: Mild enlargement   Pulmonary arteries: Normal size   Pulmonary veins: Normal configuration   Left atrium: Normal size   Left ventricle: Normal size   Pericardium: Normal thickness   Coronary arteries: Coronary calcium  score 0   IMPRESSION: 1. Bicuspid aortic valve with fusion of right and noncoronary cusps. Severe aortic valve calcifications (AV calcium  score 2640)   2. Aortic annulus measures 25mm x 23mm in diameter with perimeter 75mm and area 449 mm^2. No annular or LVOT calcifications. Annular measurements are suitable for delivery of 26mm Edwards Sapien 3 valve   3. Sufficient coronary to annulus distance.   4. Optimum Fluoroscopic Angle for Delivery:  LAO 15 CRA 2   5. Coronary calcium  score 0   Electronically Signed: By: Lonni Nanas M.D. On: 03/29/2024 18:25        Narrative & Impression  CLINICAL DATA:  Aortic valve replacement, TAVR. Preoperative evaluation.   EXAM: CT ANGIOGRAPHY CHEST, ABDOMEN AND PELVIS   TECHNIQUE: Non-contrast CT of the chest was initially obtained.   Multidetector CT  imaging through the chest, abdomen and pelvis was performed using the standard protocol during bolus administration of intravenous contrast. Multiplanar reconstructed images and MIPs were obtained and reviewed to evaluate the vascular anatomy.   RADIATION DOSE REDUCTION: This exam was performed according to the departmental dose-optimization program which includes automated exposure control, adjustment of the mA and/or kV according to patient size and/or use of iterative reconstruction technique.   CONTRAST:  OMNIPAQUE  IOHEXOL  350 MG/ML SOLN   COMPARISON:  None Available.   FINDINGS: CTA CHEST FINDINGS   Cardiovascular: The heart is enlarged and there is no pericardial effusion. There is atherosclerotic calcification of the aorta and aortic valve without evidence of aneurysm or dissection. The pulmonary trunk is normal in caliber. The aortic arch has a standard configuration.   Mediastinum/Nodes: No mediastinal, hilar, or axillary lymphadenopathy by size criteria. The thyroid gland, trachea, and esophagus are within normal limits.   Lungs/Pleura: Minimal atelectasis is noted bilaterally. No effusion or pneumothorax is seen. There is a 3 mm nodule in the right upper lobe, axial image 34.   Musculoskeletal: Old healed rib fractures are noted bilaterally. Degenerative changes are present in the thoracic spine. No acute osseous abnormality.   Review of the MIP images confirms the above findings.   CTA ABDOMEN AND PELVIS FINDINGS   VASCULAR   Aorta: Normal caliber aorta without aneurysm, dissection, vasculitis or significant stenosis. Minimal aortic  atherosclerosis.   Celiac: Celiac trunk is absent. The hepatic and splenic arteries originate from the superior mesenteric artery, normal variant.   SMA: Patent without evidence of aneurysm, dissection, vasculitis or significant stenosis.   Renals: Both renal arteries are patent without evidence of aneurysm, dissection,  vasculitis, fibromuscular dysplasia or significant stenosis.   IMA: Patent without evidence of aneurysm, dissection, vasculitis or significant stenosis.   Inflow: Patent without evidence of aneurysm, dissection, vasculitis or significant stenosis.   Veins: No obvious venous abnormality within the limitations of this arterial phase study.   Review of the MIP images confirms the above findings.   NON-VASCULAR   Hepatobiliary: There is a 8 mm hypervascular focus in the posterior right lobe of the liver, suggesting hemangioma. The gallbladder is without stones. No biliary ductal dilatation.   Pancreas: Unremarkable. No pancreatic ductal dilatation or surrounding inflammatory changes.   Spleen: Normal in size without focal abnormality.   Adrenals/Urinary Tract: The adrenal glands are within normal limits. No renal calculus or hydronephrosis bilaterally. The bladder is unremarkable.   Stomach/Bowel: The stomach is within normal limits. No bowel obstruction, free air, or pneumatosis is seen. Scattered diverticula are noted along the colon without evidence of diverticulitis. Appendix is prominent in size at 8 mm, however air is seen in the appendix and there are no surrounding inflammatory changes, likely reflecting normal variant.   Lymphatic: No abdominal or pelvic lymphadenopathy.   Reproductive: Prostate gland is enlarged.   Other: No abdominopelvic ascites. A fat containing umbilical hernia is noted.   Musculoskeletal: Degenerative changes are present in the lumbar spine. No acute osseous abnormality.   Review of the MIP images confirms the above findings.   IMPRESSION: 1. Minimal aortic atherosclerosis without evidence aneurysm or dissection. 2. 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 3. Cardiomegaly. 4. 3 mm right upper lobe pulmonary nodule. No follow-up needed if patient is low-risk.This recommendation  follows the consensus statement: Guidelines for Management of Incidental Pulmonary Nodules Detected on CT Images: From the Fleischner Society 2017; Radiology 2017; 284:228-243. 5. Enlarged prostate gland.     Electronically Signed   By: Leita Birmingham M.D.   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  I have personally reviewed his 2D echocardiogram, cardiac catheterization, and CTA studies.  His echo shows a severely calcified aortic valve with restricted leaflet mobility.  The mean gradient was 42 mmHg with a valve area of 0.69 cm and a dimensionless index of 0.22 consistent with severe aortic insufficiency.  AI pressure half-time was 396 ms consistent with moderate aortic insufficiency.  Cardiac catheterization shows no angiographic evidence of coronary disease.  I agree that aortic valve replacement is indicated in this patient to prevent progressive left ventricular dysfunction.  He has had severe aortic insufficiency since at least 04/25/2023 when it was seen on echocardiogram.  He is not very active, on disability, and has a history of heavy alcohol abuse and I think transcatheter aortic valve replacement be the best option for treating him.  His gated cardiac CTA shows anatomy suitable for TAVR using a SAPIEN 3 valve.  His abdominal and pelvic CTA shows adequate pelvic vascular anatomy to allow transfemoral insertion.   The patient was counseled at length regarding treatment alternatives for management of severe symptomatic aortic stenosis. The risks and benefits of surgical intervention has been discussed  in detail. Long-term prognosis with medical therapy was discussed. Alternative approaches such as conventional surgical aortic valve replacement, transcatheter aortic valve replacement, and palliative medical therapy were compared and contrasted at length. This discussion was placed in the  context of the patient's own specific clinical presentation and past medical history. All of his questions have been addressed.    Following the decision to proceed with transcatheter aortic valve replacement, a discussion was held regarding what types of management strategies would be attempted intraoperatively in the event of life-threatening complications, including whether or not the patient would be considered a candidate for the use of cardiopulmonary bypass and/or conversion to open sternotomy for attempted surgical intervention.  I think he would be a candidate for emergent sternotomy to manage any intraoperative complications.  The patient has been advised of a variety of complications that might develop including but not limited to risks of death, stroke, paravalvular leak, aortic dissection or other major vascular complications, aortic annulus rupture, device embolization, cardiac rupture or perforation, mitral regurgitation, acute myocardial infarction, arrhythmia, heart block or bradycardia requiring permanent pacemaker placement, congestive heart failure, respiratory failure, renal failure, pneumonia, infection, other late complications related to structural valve deterioration or migration, or other complications that might ultimately cause a temporary or permanent loss of functional independence or other long term morbidity. The patient provides full informed consent for the procedure as described and all questions were answered.       Plan:   Transfemoral TAVR using a SAPIEN 3 valve.     Dorise LOIS Fellers, MD "

## 2024-05-30 ENCOUNTER — Encounter (HOSPITAL_COMMUNITY)
Admission: RE | Disposition: A | Discharge status: Home / Self Care | Payer: Self-pay | Source: Home / Self Care | Attending: Cardiovascular Disease

## 2024-05-30 ENCOUNTER — Encounter (HOSPITAL_COMMUNITY): Payer: Self-pay | Admitting: Cardiovascular Disease

## 2024-05-30 ENCOUNTER — Inpatient Hospital Stay (HOSPITAL_COMMUNITY)

## 2024-05-30 ENCOUNTER — Other Ambulatory Visit: Payer: Self-pay

## 2024-05-30 ENCOUNTER — Inpatient Hospital Stay (HOSPITAL_COMMUNITY): Payer: Self-pay | Admitting: Physician Assistant

## 2024-05-30 ENCOUNTER — Inpatient Hospital Stay (HOSPITAL_COMMUNITY): Payer: Self-pay | Admitting: Certified Registered Nurse Anesthetist

## 2024-05-30 ENCOUNTER — Inpatient Hospital Stay (HOSPITAL_COMMUNITY)
Admission: RE | Admit: 2024-05-30 | Discharge: 2024-05-31 | DRG: 267 | Disposition: A | Discharge status: Home / Self Care | Attending: Cardiovascular Disease | Admitting: Cardiovascular Disease

## 2024-05-30 DIAGNOSIS — Z8701 Personal history of pneumonia (recurrent): Secondary | ICD-10-CM

## 2024-05-30 DIAGNOSIS — E785 Hyperlipidemia, unspecified: Secondary | ICD-10-CM

## 2024-05-30 DIAGNOSIS — Z87891 Personal history of nicotine dependence: Secondary | ICD-10-CM | POA: Diagnosis not present

## 2024-05-30 DIAGNOSIS — Z833 Family history of diabetes mellitus: Secondary | ICD-10-CM

## 2024-05-30 DIAGNOSIS — K219 Gastro-esophageal reflux disease without esophagitis: Secondary | ICD-10-CM | POA: Diagnosis present

## 2024-05-30 DIAGNOSIS — I350 Nonrheumatic aortic (valve) stenosis: Secondary | ICD-10-CM | POA: Diagnosis not present

## 2024-05-30 DIAGNOSIS — Z83438 Family history of other disorder of lipoprotein metabolism and other lipidemia: Secondary | ICD-10-CM

## 2024-05-30 DIAGNOSIS — N183 Chronic kidney disease, stage 3 unspecified: Secondary | ICD-10-CM | POA: Diagnosis present

## 2024-05-30 DIAGNOSIS — Z87898 Personal history of other specified conditions: Secondary | ICD-10-CM

## 2024-05-30 DIAGNOSIS — I10 Essential (primary) hypertension: Secondary | ICD-10-CM | POA: Diagnosis present

## 2024-05-30 DIAGNOSIS — Z006 Encounter for examination for normal comparison and control in clinical research program: Secondary | ICD-10-CM | POA: Diagnosis not present

## 2024-05-30 DIAGNOSIS — Z8673 Personal history of transient ischemic attack (TIA), and cerebral infarction without residual deficits: Secondary | ICD-10-CM

## 2024-05-30 DIAGNOSIS — Z8249 Family history of ischemic heart disease and other diseases of the circulatory system: Secondary | ICD-10-CM

## 2024-05-30 DIAGNOSIS — D696 Thrombocytopenia, unspecified: Secondary | ICD-10-CM | POA: Diagnosis present

## 2024-05-30 DIAGNOSIS — F1010 Alcohol abuse, uncomplicated: Secondary | ICD-10-CM | POA: Diagnosis present

## 2024-05-30 DIAGNOSIS — R911 Solitary pulmonary nodule: Secondary | ICD-10-CM | POA: Diagnosis present

## 2024-05-30 DIAGNOSIS — Z79899 Other long term (current) drug therapy: Secondary | ICD-10-CM

## 2024-05-30 DIAGNOSIS — I358 Other nonrheumatic aortic valve disorders: Secondary | ICD-10-CM | POA: Diagnosis present

## 2024-05-30 DIAGNOSIS — Z7982 Long term (current) use of aspirin: Secondary | ICD-10-CM

## 2024-05-30 DIAGNOSIS — I129 Hypertensive chronic kidney disease with stage 1 through stage 4 chronic kidney disease, or unspecified chronic kidney disease: Secondary | ICD-10-CM | POA: Diagnosis present

## 2024-05-30 DIAGNOSIS — Z952 Presence of prosthetic heart valve: Principal | ICD-10-CM

## 2024-05-30 DIAGNOSIS — I352 Nonrheumatic aortic (valve) stenosis with insufficiency: Principal | ICD-10-CM | POA: Diagnosis present

## 2024-05-30 HISTORY — PX: INTRAOPERATIVE TRANSTHORACIC ECHOCARDIOGRAM: SHX6523

## 2024-05-30 LAB — POCT I-STAT, CHEM 8
BUN: 8 mg/dL (ref 8–23)
Calcium, Ion: 1.2 mmol/L (ref 1.15–1.40)
Chloride: 106 mmol/L (ref 98–111)
Creatinine, Ser: 0.7 mg/dL (ref 0.61–1.24)
Glucose, Bld: 91 mg/dL (ref 70–99)
HCT: 36 % — ABNORMAL LOW (ref 39.0–52.0)
Hemoglobin: 12.2 g/dL — ABNORMAL LOW (ref 13.0–17.0)
Potassium: 3.5 mmol/L (ref 3.5–5.1)
Sodium: 142 mmol/L (ref 135–145)
TCO2: 21 mmol/L — ABNORMAL LOW (ref 22–32)

## 2024-05-30 LAB — ECHOCARDIOGRAM LIMITED
AR max vel: 2.28 cm2
AV Area VTI: 2.58 cm2
AV Area mean vel: 2.38 cm2
AV Mean grad: 4 mmHg
AV Peak grad: 7.6 mmHg
Ao pk vel: 1.38 m/s
Area-P 1/2: 3.37 cm2
P 1/2 time: 358 ms
S' Lateral: 3.2 cm

## 2024-05-30 LAB — ABO/RH: ABO/RH(D): A POS

## 2024-05-30 MED ORDER — MORPHINE SULFATE (PF) 2 MG/ML IV SOLN: 1.0000 mg | INTRAVENOUS | Status: DC | PRN

## 2024-05-30 MED ORDER — IOPAMIDOL (ISOVUE-370) INJECTION 76%
INTRAVENOUS | Status: DC | PRN
  Administered 2024-05-30: 45 mL via INTRA_ARTERIAL

## 2024-05-30 MED ORDER — TIZANIDINE HCL 2 MG PO TABS
2.0000 mg | ORAL_TABLET | Freq: Two times a day (BID) | ORAL | Status: DC
  Administered 2024-05-30 – 2024-05-31 (×2): 2 mg via ORAL
  Filled 2024-05-30 (×3): qty 1

## 2024-05-30 MED ORDER — SODIUM CHLORIDE 0.9% FLUSH: 3.0000 mL | INTRAVENOUS | Status: DC | PRN

## 2024-05-30 MED ORDER — SODIUM CHLORIDE 0.9 % IV SOLN: 250.0000 mL | INTRAVENOUS | Status: DC | PRN

## 2024-05-30 MED ORDER — HEPARIN (PORCINE) IN NACL 2000-0.9 UNIT/L-% IV SOLN
INTRAVENOUS | Status: DC | PRN
  Administered 2024-05-30: 1000 mL

## 2024-05-30 MED ORDER — CHLORHEXIDINE GLUCONATE 4 % EX SOLN: 60.0000 mL | Freq: Once | CUTANEOUS | Status: DC

## 2024-05-30 MED ORDER — CEFAZOLIN SODIUM-DEXTROSE 2-4 GM/100ML-% IV SOLN
2.0000 g | Freq: Three times a day (TID) | INTRAVENOUS | Status: AC
  Administered 2024-05-30 – 2024-05-31 (×2): 2 g via INTRAVENOUS
  Filled 2024-05-30 (×2): qty 100

## 2024-05-30 MED ORDER — ASPIRIN 81 MG PO TBEC
81.0000 mg | DELAYED_RELEASE_TABLET | Freq: Every day | ORAL | Status: DC
  Administered 2024-05-31: 81 mg via ORAL
  Filled 2024-05-30: qty 1

## 2024-05-30 MED ORDER — ROSUVASTATIN CALCIUM 5 MG PO TABS
10.0000 mg | ORAL_TABLET | Freq: Every day | ORAL | Status: DC
  Administered 2024-05-30 – 2024-05-31 (×2): 10 mg via ORAL
  Filled 2024-05-30 (×2): qty 2

## 2024-05-30 MED ORDER — SODIUM CHLORIDE 0.9% FLUSH
3.0000 mL | Freq: Two times a day (BID) | INTRAVENOUS | Status: DC
  Administered 2024-05-30 – 2024-05-31 (×2): 3 mL via INTRAVENOUS

## 2024-05-30 MED ORDER — HEPARIN SODIUM (PORCINE) 1000 UNIT/ML IJ SOLN
INTRAMUSCULAR | Status: DC | PRN
  Administered 2024-05-30: 13000 [IU] via INTRAVENOUS

## 2024-05-30 MED ORDER — SODIUM CHLORIDE 0.9 % IV SOLN: INTRAVENOUS | Status: AC

## 2024-05-30 MED ORDER — FENTANYL CITRATE (PF) 100 MCG/2ML IJ SOLN
INTRAMUSCULAR | Status: DC | PRN
  Administered 2024-05-30 (×2): 50 ug via INTRAVENOUS

## 2024-05-30 MED ORDER — HEPARIN (PORCINE) IN NACL 1000-0.9 UT/500ML-% IV SOLN
INTRAVENOUS | Status: DC | PRN
  Administered 2024-05-30: 500 mL

## 2024-05-30 MED ORDER — NOREPINEPHRINE 4 MG/250ML-% IV SOLN: 0.0000 ug/min | INTRAVENOUS | Status: DC

## 2024-05-30 MED ORDER — FENTANYL CITRATE (PF) 100 MCG/2ML IJ SOLN
INTRAMUSCULAR | Status: AC
  Filled 2024-05-30: qty 2

## 2024-05-30 MED ORDER — MIDAZOLAM HCL 5 MG/5ML IJ SOLN
INTRAMUSCULAR | Status: DC | PRN
  Administered 2024-05-30 (×2): 1 mg via INTRAVENOUS

## 2024-05-30 MED ORDER — PROTAMINE SULFATE 10 MG/ML IV SOLN
INTRAVENOUS | Status: DC | PRN
  Administered 2024-05-30: 50 mg via INTRAVENOUS

## 2024-05-30 MED ORDER — OXYCODONE HCL 5 MG PO TABS: 5.0000 mg | ORAL_TABLET | ORAL | Status: DC | PRN

## 2024-05-30 MED ORDER — MIDAZOLAM HCL 2 MG/2ML IJ SOLN
INTRAMUSCULAR | Status: AC
  Filled 2024-05-30: qty 2

## 2024-05-30 MED ORDER — CLEVIDIPINE BUTYRATE 0.5 MG/ML IV EMUL
INTRAVENOUS | Status: DC | PRN
  Administered 2024-05-30: 1.5 mg/h via INTRAVENOUS

## 2024-05-30 MED ORDER — CHLORHEXIDINE GLUCONATE 4 % EX SOLN: 30.0000 mL | CUTANEOUS | Status: DC

## 2024-05-30 MED ORDER — CHLORHEXIDINE GLUCONATE 0.12 % MT SOLN: 15.0000 mL | Freq: Once | OROMUCOSAL | Status: AC

## 2024-05-30 MED ORDER — LIDOCAINE HCL (PF) 1 % IJ SOLN
INTRAMUSCULAR | Status: AC
  Filled 2024-05-30: qty 30

## 2024-05-30 MED ORDER — AMLODIPINE BESYLATE 5 MG PO TABS
2.5000 mg | ORAL_TABLET | Freq: Every day | ORAL | Status: DC
  Administered 2024-05-30 – 2024-05-31 (×2): 2.5 mg via ORAL
  Filled 2024-05-30 (×2): qty 1

## 2024-05-30 MED ORDER — LIDOCAINE HCL (PF) 1 % IJ SOLN
INTRAMUSCULAR | Status: DC | PRN
  Administered 2024-05-30 (×2): 12 mL

## 2024-05-30 MED ORDER — ACETAMINOPHEN 325 MG PO TABS: 650.0000 mg | ORAL_TABLET | Freq: Four times a day (QID) | ORAL | Status: DC | PRN

## 2024-05-30 MED ORDER — SODIUM CHLORIDE 0.9 % IV SOLN: INTRAVENOUS | Status: DC

## 2024-05-30 MED ORDER — ACETAMINOPHEN 650 MG RE SUPP: 650.0000 mg | Freq: Four times a day (QID) | RECTAL | Status: DC | PRN

## 2024-05-30 MED ORDER — ONDANSETRON HCL 4 MG/2ML IJ SOLN: 4.0000 mg | Freq: Four times a day (QID) | INTRAMUSCULAR | Status: DC | PRN

## 2024-05-30 MED ORDER — CHLORHEXIDINE GLUCONATE 0.12 % MT SOLN
OROMUCOSAL | Status: AC
  Administered 2024-05-30: 15 mL via OROMUCOSAL
  Filled 2024-05-30: qty 15

## 2024-05-30 MED ORDER — TRAMADOL HCL 50 MG PO TABS
50.0000 mg | ORAL_TABLET | ORAL | Status: DC | PRN
  Administered 2024-05-30: 50 mg via ORAL
  Filled 2024-05-30: qty 1

## 2024-05-30 MED ORDER — NITROGLYCERIN IN D5W 200-5 MCG/ML-% IV SOLN: 0.0000 ug/min | INTRAVENOUS | Status: DC

## 2024-05-30 MED ORDER — FAMOTIDINE 20 MG PO TABS
20.0000 mg | ORAL_TABLET | Freq: Every day | ORAL | Status: DC
  Administered 2024-05-30 – 2024-05-31 (×2): 20 mg via ORAL
  Filled 2024-05-30 (×2): qty 1

## 2024-05-30 NOTE — Discharge Summary (Incomplete)
 " [facility]   MULTIDISCIPLINARY HEART VALVE TEAM  Discharge Summary    Patient ID: Steve Kelly MRN: [id number]; DOB: [birth year]  Admit date: 05/30/2024 Discharge date: 05/31/2024  PCP:  Allen, Chad, NP  [facility] Cardiologist:  Alean SAUNDERS Madireddy, MD  [facility] Structural heart: Lonni Cash, MD [facility] Electrophysiologist:  None   Discharge Diagnoses    Principal Problem:   S/P TAVR (transcatheter aortic valve replacement) Active Problems:   Alcohol abuse   Hypertension   Thrombocytopenia   GERD (gastroesophageal reflux disease)   Severe aortic stenosis   History of cerebral infarction   Hyperlipidemia   Allergies Allergies[1]  Diagnostic Studies/Procedures    [facility]ENTER  TAVR OPERATIVE NOTE     Date of Procedure:                05/30/2024   Preoperative Diagnosis:      Severe Aortic Stenosis    Postoperative Diagnosis:    Same    Procedure:        Transcatheter Aortic Valve Replacement - Transfemoral Approach             Edwards Sapien 3 Ultra Resilia THV (size 26 mm, model # [id number], serial # [id number] )              Co-Surgeons:                        Lonni Cash, MD and Dorise Fellers , MD    Anesthesiologist:                  Hodierne   Echocardiographer:              Croitoru   Pre-operative Echo Findings: Severe aortic stenosis Normal left ventricular systolic function   Post-operative Echo Findings: Trivial paravalvular leak Normal left ventricular systolic function _____________    Echo 05/31/24: completed but pending formal read at the time of discharge   History of Present Illness     Steve Kelly is a 69 y.o. male with a history of ETOH abuse with admission 04/2023 with withdrawal, former tobacco abuse, GERD, HLD, HTN and severe AS & mod AI who presented to [facility]/30/24 for planned TAVR.   [facility] 08/2023 showded no evidence of coronary disease and normal right  heart pressures. Echo 02/16/2024 showed EF 60%, moderate AI and severe AS with mean grad 42 mmHg, Vmax 4.1 m/s, AVA 0.62 cm2.   The patient was evaluated by the multidisciplinary valve team and felt to have severe, symptomatic aortic stenosis and to be a suitable candidate for TAVR, which was set up for 05/30/24.   Hospital Course     Consultants: none   Severe AS:  -- S/p TAVR with a 26 mm Edwards Sapien 3 Ultra Resilia THV via the TF approach on 05/30/2024.  -- Post operative echo completed but pending formal read. -- Groin sites are stable.  -- ECG with sinus and no high grade heart block. -- Continue Asprin 81mg  daily. -- Met with cardiac rehab to discuss CRP phase II.  -- Plan for discharge home today with close follow up in the outpatient setting.   HTN: -- BP well controlled.  -- Resume amlodipine  2.5mg  daily, Coreg  3.125mg  BID, and losartan  50mg  daily.  HLD: -- Continue rosuvastatin  10mg  daily.   Pulmonary nodule: -- Pre TAVR CT showed a 3 mm right upper lobe pulmonary nodule. No follow-up  needed if patient is low-risk.  -- This will be discussed in the outpatient setting.   _____________  Discharge Vitals Blood pressure (!) 154/86, pulse 78, temperature 97.9 F (36.6 C), resp. rate 18, height 5' 9 (1.753 m), weight 72.7 kg, SpO2 96%.  Filed Weights   05/29/24 1100 05/30/24 1111 05/31/24 0603  Weight: 73.5 kg 73.5 kg 72.7 kg     GEN: Well nourished, well developed in no acute distress NECK: No JVD CARDIAC: RRR, no murmurs, rubs, gallops RESPIRATORY:  Clear to auscultation without rales, wheezing or rhonchi  ABDOMEN: Soft, non-tender, non-distended EXTREMITIES:  No edema; No deformity.  Groin sites clear without hematoma or ecchymosis.    Disposition   Pt is being discharged home today in good condition.  Follow-up Plans & Appointments     Follow-up Information     Sebastian Lamarr SAUNDERS, PA-C. Go on 06/12/2024.   Specialties: Cardiology, Radiology Why: @  11:10am, please arrive at least 20 minutes early. Contact information: [address] [place] [address] [phone]                Discharge Instructions     Amb Referral to Cardiac Rehabilitation   Complete by: As directed    [place]   Diagnosis: Valve Replacement   Valve: Aortic Comment - TAVR   After initial evaluation and assessments completed: Virtual Based Care may be provided alone or in conjunction with Phase 2 Cardiac Rehab based on patient barriers.: Yes   Intensive Cardiac Rehabilitation (ICR) [facility] location only OR Traditional Cardiac Rehabilitation (TCR) *If criteria for ICR are not met will enroll in TCR [facility] only): Yes       Discharge Medications   Allergies as of 05/31/2024   No Known Allergies      Medication List     TAKE these medications    acetaminophen  500 MG tablet Commonly known as: TYLENOL  Take 500 mg by mouth every 6 (six) hours as needed (pain.).   amLODipine  2.5 MG tablet Commonly known as: NORVASC  Take 1 tablet (2.5 mg total) by mouth daily.   aspirin  EC 81 MG tablet Take 1 tablet (81 mg total) by mouth daily. Swallow whole.   carvedilol  6.25 MG tablet Commonly known as: COREG  Take 1 tablet (6.25 mg total) by mouth 2 (two) times daily with a meal.   cyanocobalamin  1000 MCG tablet Commonly known as: VITAMIN B12 Take 1 tablet (1,000 mcg total) by mouth daily.   famotidine  20 MG tablet Commonly known as: PEPCID  Take 1 tablet (20 mg total) by mouth daily.   losartan  50 MG tablet Commonly known as: COZAAR  Take 50 mg by mouth daily.   rosuvastatin  10 MG tablet Commonly known as: Crestor  Take 1 tablet (10 mg total) by mouth daily.   tiZANidine  2 MG tablet Commonly known as: ZANAFLEX  Take 2 mg by mouth in the morning and at bedtime.            Outstanding Labs/Studies   none  ______________________  Duration of Discharge Encounter: APP Time: 15 minutes    Signed, Lamarr Sebastian, PA-C 05/31/2024,  10:59 AM [phone]  Jerilynn LELON Counter was seen by me today along with Lamarr Sebastian, PA-C. I have personally performed an evaluation on this patient.  My findings are as follows:  69 y.o. male with severe aortic stenosis who is now one day post TAVR. [name]. No complaints today.   Data: EKG(s) and pertinent labs, studies, etc were personally reviewed and interpreted by me:  EKG reviewed by me: Sinus Telemetry reviewed by me: sinus All labs reviewed by me Otherwise, I agree with data as outlined by the advanced practice provider.  Exam performed by me: Gen: NAD Neck: No JVD Cardiac: RRR without murmur Lungs: clear bilaterally Extremities: No LE edema  My Assessment and Plan:  Severe aortic stenosis: Doing well one day post TAVR from the transfemoral approach. Continue ASA and Coreg . SBE Prophylaxis as indicated.   The patient will be discharged home today  I spent 20 minutes seeing the patient. During that time, I reviewed the labs, EKG, telemetry, evaluated their symptoms and performed an examination. This time also included plan formulation, discussion of the plan with the patient and the time spent with documentation.   Signed,  Lonni Cash, MD  05/31/2024 11:31 AM        [1] No Known Allergies  "

## 2024-05-30 NOTE — Progress Notes (Signed)
" °  Echocardiogram 2D Echocardiogram has been performed.  Steve Kelly 05/30/2024, 3:02 PM "

## 2024-05-30 NOTE — Discharge Instructions (Signed)

## 2024-05-30 NOTE — Progress Notes (Signed)
 Pt arrived from ...cath.., A/ox 4...pt denies any pain, MD aware,CCMD called. CHG bath given,no further needs at this time

## 2024-05-30 NOTE — Op Note (Signed)
 " [facility]   MULTIDISCIPLINARY HEART VALVE TEAM   TAVR OPERATIVE NOTE   Date of Procedure:  05/30/2024  Preoperative Diagnosis: Severe Aortic Stenosis   Postoperative Diagnosis: Same   Procedure:   Transcatheter Aortic Valve Replacement - Percutaneous Left Transfemoral Approach  Edwards Sapien 3 Ultra Resilia THV (size 26 mm, model # [id number], serial # [id number])   Co-Surgeons:  Dorise LOIS Fellers, MD and Lonni Cash, MD   Anesthesiologist:  Maryclare, MD  Echocardiographer:  Croitoru, MD  Pre-operative Echo Findings: Severe aortic stenosis Normal left ventricular systolic function  Post-operative Echo Findings: Trivial paravalvular leak Normal left ventricular systolic function   BRIEF CLINICAL NOTE AND INDICATIONS FOR SURGERY  This 69 year old gentleman has stage D, 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  I have personally reviewed his 2D echocardiogram, cardiac catheterization, and CTA studies.  His echo shows a severely calcified aortic valve with restricted leaflet mobility.  The mean gradient was 42 mmHg with a valve area of 0.69 cm and a dimensionless index of 0.22 consistent with severe aortic insufficiency.  AI pressure half-time was 396 ms consistent with moderate aortic insufficiency.  Cardiac catheterization shows no angiographic evidence of coronary disease.  I agree that aortic valve replacement is indicated in this patient to prevent progressive left ventricular dysfunction.  He has had severe aortic insufficiency since at least 04/25/2023 when it was seen on echocardiogram.  He is not very active, on disability, and has a history of heavy alcohol abuse and I think transcatheter aortic valve replacement be the best option for treating him.  His gated cardiac CTA shows anatomy suitable for TAVR using a SAPIEN 3 valve.  His abdominal and pelvic CTA shows adequate pelvic vascular anatomy to allow transfemoral insertion.   The  patient was counseled at length regarding treatment alternatives for management of severe symptomatic aortic stenosis. The risks and benefits of surgical intervention has been discussed in detail. Long-term prognosis with medical therapy was discussed. Alternative approaches such as conventional surgical aortic valve replacement, transcatheter aortic valve replacement, and palliative medical therapy were compared and contrasted at length. This discussion was placed in the context of the patient's own specific clinical presentation and past medical history. All of his questions have been addressed.    Following the decision to proceed with transcatheter aortic valve replacement, a discussion was held regarding what types of management strategies would be attempted intraoperatively in the event of life-threatening complications, including whether or not the patient would be considered a candidate for the use of cardiopulmonary bypass and/or conversion to open sternotomy for attempted surgical intervention.  I think he would be a candidate for emergent sternotomy to manage any intraoperative complications.  The patient has been advised of a variety of complications that might develop including but not limited to risks of death, stroke, paravalvular leak, aortic dissection or other major vascular complications, aortic annulus rupture, device embolization, cardiac rupture or perforation, mitral regurgitation, acute myocardial infarction, arrhythmia, heart block or bradycardia requiring permanent pacemaker placement, congestive heart failure, respiratory failure, renal failure, pneumonia, infection, other late complications related to structural valve deterioration or migration, or other complications that might ultimately cause a temporary or permanent loss of functional independence or other long term morbidity. The patient provides full informed consent for the procedure as described and all questions were  answered.    DETAILS OF THE OPERATIVE PROCEDURE  PREPARATION:    The patient was brought to the operating room on the above mentioned date  and appropriate monitoring was established by the anesthesia team. The patient was placed in the supine position on the operating table.  Intravenous antibiotics were administered. The patient was monitored closely throughout the procedure under conscious sedation.    Baseline transthoracic echocardiogram was performed. The patient's abdomen and both groins were prepped and draped in a sterile manner. A time out procedure was performed.   PERIPHERAL ACCESS:    Using the modified Seldinger technique, femoral arterial and venous access was obtained with placement of 6 Fr sheaths on the right side.  A pigtail diagnostic catheter was passed through the right arterial sheath under fluoroscopic guidance into the aortic root.  A temporary transvenous pacemaker catheter was passed through the right femoral venous sheath under fluoroscopic guidance into the right ventricle.  The pacemaker was tested to ensure stable lead placement and pacemaker capture. Aortic root angiography was performed in order to determine the optimal angiographic angle for valve deployment.   TRANSFEMORAL ACCESS:   Percutaneous transfemoral access and sheath placement was performed using ultrasound guidance.  The left common femoral artery was cannulated using a micropuncture needle and appropriate location was verified using hand injection angiogram.  A pair of Abbott Perclose percutaneous closure devices were placed and a 6 French sheath replaced into the femoral artery.  The patient was heparinized systemically and ACT verified > 250 seconds.    A 14 Fr transfemoral E-sheath was introduced into the left common femoral artery after progressively dilating over an Amplatz superstiff wire. An AL-2 catheter was used to direct a straight-tip exchange length wire across the native aortic valve  into the left ventricle. This was exchanged out for a pigtail catheter and position was confirmed in the LV apex. Simultaneous LV and Ao pressures were recorded.  The pigtail catheter was exchanged for a Safari wire in the LV apex.   BALLOON AORTIC VALVULOPLASTY:   Balloon aortic valvuloplasty was performed using a 22 mm True balloon.  Once optimal position was achieved, BAV was done under rapid ventricular pacing. The patient recovered well hemodynamically.    TRANSCATHETER HEART VALVE DEPLOYMENT:   An Edwards Sapien 3 Ultra transcatheter heart valve (size 26 mm) was prepared and crimped per manufacturer's guidelines, and the proper orientation of the valve is confirmed on the [employer] delivery system. The valve was advanced through the introducer sheath using normal technique until in an appropriate position in the abdominal aorta beyond the sheath tip. The balloon was then retracted and using the fine-tuning wheel was centered on the valve. The valve was then advanced across the aortic arch using appropriate flexion of the catheter. The valve was carefully positioned across the aortic valve annulus. The Commander catheter was retracted using normal technique. Once final position of the valve has been confirmed by angiographic assessment, the valve is deployed during rapid ventricular pacing to maintain systolic blood pressure < 50 mmHg and pulse pressure < 10 mmHg. The balloon inflation is held for >3 seconds after reaching full deployment volume. Once the balloon has fully deflated the balloon is retracted into the ascending aorta and valve function is assessed using echocardiography. There is felt to be trivial paravalvular leak and no central aortic insufficiency.  The patient's hemodynamic recovery following valve deployment is good.  The deployment balloon and guidewire are both removed.    PROCEDURE COMPLETION:   The sheath was removed and femoral artery closure performed.  Protamine   was administered once femoral arterial repair was complete. The temporary pacemaker, pigtail catheter and  femoral sheaths were removed with manual pressure used for venous hemostasis.  A Mynx femoral closure device was utilized following removal of the diagnostic sheath in the right femoral artery.  The patient tolerated the procedure well and is transported to the cath lab recovery area in stable condition. There were no immediate intraoperative complications. All sponge instrument and needle counts are verified correct at completion of the operation.   No blood products were administered during the operation.  The patient received a total of 45 mL of intravenous contrast during the procedure.   Dorise MARLA Fellers, MD 05/30/2024 3:24 PM      "

## 2024-05-30 NOTE — Transfer of Care (Signed)
 Immediate Anesthesia Transfer of Care Note  Patient: Jerilynn LELON Counter  Procedure(s) Performed: Transcatheter Aortic Valve Replacement, Transfemoral ECHOCARDIOGRAM, TRANSTHORACIC  Patient Location: Cath Lab  Anesthesia Type:MAC  Level of Consciousness: awake, alert , and oriented  Airway & Oxygen Therapy: Patient Spontanous Breathing  Post-op Assessment: Report given to RN and Post -op Vital signs reviewed and stable  Post vital signs: Reviewed and stable  Last Vitals:  Vitals Value Taken Time  BP 120/84 05/30/24 15:50  Temp 37.1 C 05/30/24 15:25  Pulse 60 05/30/24 15:55  Resp 14 05/30/24 15:55  SpO2 95 % 05/30/24 15:55  Vitals shown include unfiled device data.  Last Pain:  Vitals:   05/30/24 1525  TempSrc: Oral  PainSc:       Patients Stated Pain Goal: 0 (05/30/24 1124)  Complications: There were no known notable events for this encounter.

## 2024-05-30 NOTE — Progress Notes (Signed)
 Site area: right femoral Site Prior to Removal:  Level 0 Pressure Applied For: 20 minutes Manual:   yes Patient Status During Pull: stable   Post Pull Site:  Level 0 Post Pull Instructions Given:  yes Post Pull Pulses Present: yes Dressing Applied:  yes CDI Bedrest begins @ 1541 Comments:

## 2024-05-30 NOTE — Progress Notes (Signed)
" °  [facility]   MULTIDISCIPLINARY HEART VALVE TEAM  Patient doing well s/p TAVR. He is hemodynamically stable. Groin sites stable. ECG with sinus and no high grade block. Transferred from cath lab holding to 4E. BP creeping up. Will order home amlodipine  2.5mg  daily. Early ambulation after bedrest completed and hopeful discharge over the next 24-48 hours.   Lamarr Hummer PA-C  [facility]  Pager [phone]  "

## 2024-05-30 NOTE — CV Procedure (Signed)
 " [facility]  TAVR OPERATIVE NOTE   Date of Procedure:  05/30/2024  Preoperative Diagnosis: Severe Aortic Stenosis   Postoperative Diagnosis: Same   Procedure:   Transcatheter Aortic Valve Replacement - Transfemoral Approach  Edwards Sapien 3 Ultra Resilia THV (size 26 mm, model # [id number], serial # [id number] )   Co-Surgeons:  Lonni Cash, MD and Dorise Fellers , MD   Anesthesiologist:  Hodierne  Echocardiographer:  Croitoru  Pre-operative Echo Findings: Severe aortic stenosis Normal left ventricular systolic function  Post-operative Echo Findings: Trivial paravalvular leak Normal left ventricular systolic function  BRIEF CLINICAL NOTE AND INDICATIONS FOR SURGERY  69 yo male with history of etoh abuse, former tobacco abuse, delirium tremens, GERD, HLD, HTN, CKD stage 3 and severe aortic stenosis. Echo 02/16/24 with LVEF=60-65%. Normal RV function. Severe aortic stenosis with mean gradient 42 mmHg, AVA 0.62 cm2, DI 0.22, SVI 38. Moderate AI.   During the course of the patient's preoperative work up they have been evaluated comprehensively by a multidisciplinary team of specialists coordinated through the Multidisciplinary Heart Valve Clinic in the [facility].  They have been demonstrated to suffer from symptomatic severe aortic stenosis as noted above. The patient has been counseled extensively as to the relative risks and benefits of all options for the treatment of severe aortic stenosis including long term medical therapy, conventional surgery for aortic valve replacement, and transcatheter aortic valve replacement.  The patient has been independently evaluated by Dr. Fellers with CT surgery and they are felt to be at high risk for conventional surgical aortic valve replacement. The surgeon indicated the patient would be a poor candidate for conventional surgery. Based upon review of all of the patient's preoperative diagnostic tests  they are felt to be candidate for transcatheter aortic valve replacement using the transfemoral approach as an alternative to high risk conventional surgery.    Following the decision to proceed with transcatheter aortic valve replacement, a discussion has been held regarding what types of management strategies would be attempted intraoperatively in the event of life-threatening complications, including whether or not the patient would be considered a candidate for the use of cardiopulmonary bypass and/or conversion to open sternotomy for attempted surgical intervention.  The patient has been advised of a variety of complications that might develop peculiar to this approach including but not limited to risks of death, stroke, paravalvular leak, aortic dissection or other major vascular complications, aortic annulus rupture, device embolization, cardiac rupture or perforation, acute myocardial infarction, arrhythmia, heart block or bradycardia requiring permanent pacemaker placement, congestive heart failure, respiratory failure, renal failure, pneumonia, infection, other late complications related to structural valve deterioration or migration, or other complications that might ultimately cause a temporary or permanent loss of functional independence or other long term morbidity.  The patient provides full informed consent for the procedure as described and all questions were answered preoperatively.    DETAILS OF THE OPERATIVE PROCEDURE  PREPARATION:   The patient is brought to the operating room on the above mentioned date and central monitoring was established by the anesthesia team including placement of a radial arterial line. The patient is placed in the supine position on the operating table.  Intravenous antibiotics are administered. Conscious sedation is used.   Baseline transthoracic echocardiogram was performed. The patient's chest, abdomen, both groins, and both lower extremities are prepared  and draped in a sterile manner. A time out procedure is performed.   PERIPHERAL ACCESS:   Using the  modified Seldinger technique, femoral arterial and venous access were obtained with placement of a 6 Fr sheath in the artery and a 7 Fr sheath in the vein on the right side using u/s guidance.  A pigtail diagnostic catheter was passed through the femoral arterial sheath under fluoroscopic guidance into the aortic root.  A temporary transvenous pacemaker catheter was passed through the femoral venous sheath under fluoroscopic guidance into the right ventricle.  The pacemaker was tested to ensure stable lead placement and pacemaker capture. Aortic root angiography was performed in order to determine the optimal angiographic angle for valve deployment.  TRANSFEMORAL ACCESS:  A micropuncture kit was used to gain access to the left femoral artery using u/s guidance. Position confirmed with angiography. Pre-closure with double ProGlide closure devices. The patient was heparinized systemically and ACT verified > 250 seconds.    A 14 Fr transfemoral E-sheath was introduced into the left femoral artery after progressively dilating over an Amplatz superstiff wire. An AL-2 catheter was used to direct a straight-tip exchange length wire across the native aortic valve into the left ventricle. This was exchanged out for a pigtail catheter and position was confirmed in the LV apex. Simultaneous LV and Ao pressures were recorded.  The pigtail catheter was then exchanged for a Safari wire in the LV apex.   A 18 mm True balloon was then used to perform balloon valvuloplasty.   TRANSCATHETER HEART VALVE DEPLOYMENT:  An Edwards Sapien 3 THV (size 26 mm) was prepared and crimped per manufacturer's guidelines, and the proper orientation of the valve is confirmed on the [employer] delivery system. The valve was advanced through the introducer sheath using normal technique until in an appropriate position in the  abdominal aorta beyond the sheath tip. The balloon was then retracted and using the fine-tuning wheel was centered on the valve. The valve was then advanced across the aortic arch using appropriate flexion of the catheter. The valve was carefully positioned across the aortic valve annulus. The Commander catheter was retracted using normal technique. Once final position of the valve has been confirmed by angiographic assessment, the valve is deployed while temporarily holding ventilation and during rapid ventricular pacing to maintain systolic blood pressure < 50 mmHg and pulse pressure < 10 mmHg. The balloon inflation is held for >3 seconds after reaching full deployment volume. Once the balloon has fully deflated the balloon is retracted into the ascending aorta and valve function is assessed using TTE. There is felt to be a trivial paravalvular leak and no central aortic insufficiency.  The patient's hemodynamic recovery following valve deployment is good.  The deployment balloon and guidewire are both removed. Echo demostrated acceptable post-procedural gradients, stable mitral valve function, and trivial AI.   PROCEDURE COMPLETION:  The sheath was then removed and closure devices were completed. Protamine  was administered once femoral arterial repair was complete. The temporary pacemaker, pigtail catheters and femoral sheaths were removed with a Mynx closure device placed in the artery and manual pressure used for venous hemostasis.    The patient tolerated the procedure well and is transported to the surgical intensive care in stable condition. There were no immediate intraoperative complications. All sponge instrument and needle counts are verified correct at completion of the operation.   No blood products were administered during the operation.  The patient received a total of 45 mL of intravenous contrast during the procedure.  LVEDP: 17 mm Hg  Lonni Cash MD, FACC 05/30/2024 3:10  PM       "

## 2024-05-30 NOTE — Anesthesia Preprocedure Evaluation (Signed)
"                                    Anesthesia Evaluation  Patient identified by MRN, date of birth, ID band Patient awake    Reviewed: Allergy & Precautions, H&P , NPO status , Patient's Chart, lab work & pertinent test results  Airway Mallampati: II   Neck ROM: full    Dental   Pulmonary former smoker   breath sounds clear to auscultation       Cardiovascular hypertension, + Valvular Problems/Murmurs AS  Rhythm:regular Rate:Normal  Severe AS   Neuro/Psych    GI/Hepatic ,GERD  ,,(+)     substance abuse  alcohol use  Endo/Other    Renal/GU Renal InsufficiencyRenal disease     Musculoskeletal   Abdominal   Peds  Hematology   Anesthesia Other Findings   Reproductive/Obstetrics                              Anesthesia Physical Anesthesia Plan  ASA: 3  Anesthesia Plan: MAC   Post-op Pain Management:    Induction: Intravenous  PONV Risk Score and Plan: 1 and Treatment may vary due to age or medical condition  Airway Management Planned: Simple Face Mask  Additional Equipment:   Intra-op Plan:   Post-operative Plan:   Informed Consent: I have reviewed the patients History and Physical, chart, labs and discussed the procedure including the risks, benefits and alternatives for the proposed anesthesia with the patient or authorized representative who has indicated his/her understanding and acceptance.     Dental advisory given  Plan Discussed with: CRNA, Anesthesiologist and Surgeon  Anesthesia Plan Comments:         Anesthesia Quick Evaluation  "

## 2024-05-31 ENCOUNTER — Inpatient Hospital Stay (HOSPITAL_COMMUNITY)

## 2024-05-31 ENCOUNTER — Encounter (HOSPITAL_COMMUNITY): Payer: Self-pay | Admitting: Cardiovascular Disease

## 2024-05-31 DIAGNOSIS — Z952 Presence of prosthetic heart valve: Secondary | ICD-10-CM

## 2024-05-31 LAB — BASIC METABOLIC PANEL WITH GFR
Anion gap: 13 (ref 5–15)
BUN: 8 mg/dL (ref 8–23)
CO2: 22 mmol/L (ref 22–32)
Calcium: 9.4 mg/dL (ref 8.9–10.3)
Chloride: 100 mmol/L (ref 98–111)
Creatinine, Ser: 0.8 mg/dL (ref 0.61–1.24)
GFR, Estimated: >60 mL/min
Glucose, Bld: 100 mg/dL — ABNORMAL HIGH (ref 70–99)
Potassium: 3.8 mmol/L (ref 3.5–5.1)
Sodium: 135 mmol/L (ref 135–145)

## 2024-05-31 LAB — ECHOCARDIOGRAM COMPLETE
AR max vel: 1.75 cm2
AV Area VTI: 2.05 cm2
AV Area mean vel: 1.36 cm2
AV Mean grad: 7 mmHg
AV Peak grad: 12.4 mmHg
Ao pk vel: 1.76 m/s
Area-P 1/2: 2.97 cm2
Calc EF: 67.5 %
Height: 69 in
S' Lateral: 2.8 cm
Single Plane A2C EF: 76.5 %
Single Plane A4C EF: 60.1 %
Weight: 2564.8 [oz_av]

## 2024-05-31 LAB — CBC
HCT: 38.7 % — ABNORMAL LOW (ref 39.0–52.0)
Hemoglobin: 13.8 g/dL (ref 13.0–17.0)
MCH: 32.2 pg (ref 26.0–34.0)
MCHC: 35.7 g/dL (ref 30.0–36.0)
MCV: 90.2 fL (ref 80.0–100.0)
Platelets: 167 K/uL (ref 150–400)
RBC: 4.29 MIL/uL (ref 4.22–5.81)
RDW: 12.7 % (ref 11.5–15.5)
WBC: 9.1 K/uL (ref 4.0–10.5)
nRBC: 0 % (ref 0.0–0.2)

## 2024-05-31 LAB — MAGNESIUM: Magnesium: 1.7 mg/dL (ref 1.7–2.4)

## 2024-05-31 MED ORDER — LOSARTAN POTASSIUM 50 MG PO TABS
50.0000 mg | ORAL_TABLET | Freq: Every day | ORAL | Status: DC
  Administered 2024-05-31: 50 mg via ORAL
  Filled 2024-05-31: qty 1

## 2024-05-31 MED ORDER — CARVEDILOL 6.25 MG PO TABS: 6.2500 mg | ORAL_TABLET | Freq: Two times a day (BID) | ORAL | Status: DC

## 2024-05-31 MED FILL — Midazolam HCl Inj 2 MG/2ML (Base Equivalent): INTRAMUSCULAR | Qty: 2 | Status: AC

## 2024-05-31 NOTE — Anesthesia Postprocedure Evaluation (Signed)
"   Anesthesia Post Note  Patient: Steve Kelly  Procedure(s) Performed: Transcatheter Aortic Valve Replacement, Transfemoral ECHOCARDIOGRAM, TRANSTHORACIC     Patient location during evaluation: Cath Lab Anesthesia Type: MAC Level of consciousness: awake and alert Pain management: pain level controlled Vital Signs Assessment: post-procedure vital signs reviewed and stable Respiratory status: spontaneous breathing, nonlabored ventilation, respiratory function stable and patient connected to nasal cannula oxygen Cardiovascular status: stable and blood pressure returned to baseline Postop Assessment: no apparent nausea or vomiting Anesthetic complications: no   There were no known notable events for this encounter.  Last Vitals:  Vitals:   05/31/24 0300 05/31/24 0809  BP: (!) 145/92 (!) 154/86  Pulse: 65 78  Resp: 20 18  Temp: 36.7 C 36.6 C  SpO2: 94% 96%    Last Pain:  Vitals:   05/31/24 0300  TempSrc: Oral  PainSc:                  [name]      "

## 2024-05-31 NOTE — Progress Notes (Signed)
 Mobility Specialist Progress Note;   05/31/24 0903  Mobility  Activity Ambulated independently  Level of Assistance Contact guard assist, steadying assist  Assistive Device None  Distance Ambulated (ft) 350 ft  Activity Response Tolerated well  Mobility Referral Yes  Mobility visit 1 Mobility  Mobility Specialist Start Time (ACUTE ONLY) [id number]  Mobility Specialist Stop Time (ACUTE ONLY) 0913  Mobility Specialist Time Calculation (min) (ACUTE ONLY) 10 min   Pt agreeable to mobility. Required light MinG assistance during ambulation for safety. HR up to 106 bpm w/ activity. C/o mild L knee pain throughout ambulation. No bleeding or oozing at groin sites. Pt returned back to bed and left with all needs met, call bell in reach.   Lauraine Erm Mobility Specialist Please contact via SecureChat or [employer] [phone]

## 2024-05-31 NOTE — Progress Notes (Signed)
" °  Echocardiogram 2D Echocardiogram has been performed.  Tinnie FORBES Gosling RDCS 05/31/2024, 11:10 AM "

## 2024-05-31 NOTE — Progress Notes (Signed)
" °   05/31/24 1448  TOC Brief Assessment  Insurance and Status Reviewed  Patient has primary care physician Yes  Home environment has been reviewed home w/ brother  Prior level of function: self/independent  Prior/Current Home Services No current home services  Social Drivers of Health Review SDOH reviewed no interventions necessary  Readmission risk has been reviewed Yes  Transition of care needs no transition of care needs at this time    S/p TAVR, likely transition home today once cleared by attending. To go home w/ brother. No HH or DME needs noted.  "

## 2024-05-31 NOTE — Progress Notes (Signed)
 CARDIAC REHAB PHASE I   Post TAVR education including site care, restrictions, risk factors, heart healthy diet, exercise guidelines, and CRP2 reviewed. Patient states he may be interested. Referral will be placed for [facility]. All questions and concerns addressed.     [id number] Steve JAYSON [place], RN BSN 05/31/2024 10:00 AM

## 2024-05-31 NOTE — Progress Notes (Signed)
 Discharge   Patient expressed verbal understanding of discharge POC.   Patient given time to ask any questions.  Additional education included in AVS.  Alert oriented in good spirits.   Tele and 2 PIV removed. Pressure dressings intact.  All personal belongings at bedtime. B/l groin dressings intact. Patients states, The doctors told me not to take the dressings off and keep them dry.  VSS.  Denies pain or discomfort. NO TOC meds. Ride on the way from [place]. Transferring to Discharge Lounge.

## 2024-06-01 ENCOUNTER — Telehealth: Payer: Self-pay

## 2024-06-01 NOTE — Telephone Encounter (Signed)
 Patient contacted regarding discharge from [facility] on 05/31/24  Patient understands to follow up with provider Izetta Hummer, PA-C on 06/12/24 at 11:10AM at [address] Location. Patient understands discharge instructions? Yes Patient understands medications and regiment? Yes Patient understands to bring all medications to this visit? Yes

## 2024-06-02 ENCOUNTER — Telehealth: Payer: Self-pay

## 2024-06-02 NOTE — Telephone Encounter (Signed)
 Caller Steve Kelly) stated the contacted the patient but the patient refused service for cardiac rehab.

## 2024-06-12 ENCOUNTER — Ambulatory Visit: Admitting: Physician Assistant

## 2024-06-14 ENCOUNTER — Ambulatory Visit: Admitting: Cardiovascular Disease

## 2024-06-14 VITALS — BP 122/60 | HR 70 | Ht 69.0 in | Wt 163.0 lb

## 2024-06-14 DIAGNOSIS — I350 Nonrheumatic aortic (valve) stenosis: Secondary | ICD-10-CM

## 2024-06-14 NOTE — Patient Instructions (Signed)
 Medication Instructions:  The current medical regimen is effective;  continue present plan and medications.  *If you need a refill on your cardiac medications before your next appointment, please call your pharmacy*  Testing/Procedures: Echo as scheduled.  Follow-Up: At [facility], you and your health needs are our priority.  As part of our continuing mission to provide you with exceptional heart care, our providers are all part of one team.  This team includes your primary Cardiologist (physician) and Advanced Practice Providers or APPs (Physician Assistants and Nurse Practitioners) who all work together to provide you with the care you need, when you need it.  Your next appointment:   As scheduled.   We recommend signing up for the patient portal called MyChart.  Sign up information is provided on this After Visit Summary.  MyChart is used to connect with patients for Virtual Visits (Telemedicine).  Patients are able to view lab/test results, encounter notes, upcoming appointments, etc.  Non-urgent messages can be sent to your provider as well.   To learn more about what you can do with MyChart, go to [email or web address].

## 2024-07-06 ENCOUNTER — Ambulatory Visit (HOSPITAL_COMMUNITY)

## 2024-07-06 ENCOUNTER — Ambulatory Visit: Admitting: Physician Assistant
